# Patient Record
Sex: Female | Born: 1950
Health system: Southern US, Community
[De-identification: ages and names within clinical notes are randomized; demographics above are authoritative.]

## PROBLEM LIST (undated history)

## (undated) DIAGNOSIS — C50919 Malignant neoplasm of unspecified site of unspecified female breast: Secondary | ICD-10-CM

## (undated) DIAGNOSIS — S92351B Displaced fracture of fifth metatarsal bone, right foot, initial encounter for open fracture: Secondary | ICD-10-CM

## (undated) DIAGNOSIS — G4733 Obstructive sleep apnea (adult) (pediatric): Secondary | ICD-10-CM

## (undated) DIAGNOSIS — C4490 Unspecified malignant neoplasm of skin, unspecified: Secondary | ICD-10-CM

## (undated) DIAGNOSIS — I8392 Asymptomatic varicose veins of left lower extremity: Secondary | ICD-10-CM

## (undated) DIAGNOSIS — C50112 Malignant neoplasm of central portion of left female breast: Secondary | ICD-10-CM

## (undated) DIAGNOSIS — J449 Chronic obstructive pulmonary disease, unspecified: Secondary | ICD-10-CM

## (undated) DIAGNOSIS — Z803 Family history of malignant neoplasm of breast: Secondary | ICD-10-CM

## (undated) DIAGNOSIS — F0280 Dementia in other diseases classified elsewhere without behavioral disturbance: Secondary | ICD-10-CM

## (undated) HISTORY — DX: Unspecified malignant neoplasm of skin, unspecified: C44.90

## (undated) HISTORY — DX: Malignant neoplasm of unspecified site of unspecified female breast: C50.919

## (undated) HISTORY — DX: Obstructive sleep apnea (adult) (pediatric): G47.33

## (undated) HISTORY — DX: Dementia in other diseases classified elsewhere, unspecified severity, without behavioral disturbance, psychotic disturbance, mood disturbance, and anxiety: F02.80

## (undated) HISTORY — PX: OTHER SURGICAL HISTORY: SHX169

## (undated) HISTORY — DX: Displaced fracture of fifth metatarsal bone, right foot, initial encounter for open fracture: S92.351B

## (undated) HISTORY — DX: Chronic obstructive pulmonary disease, unspecified: J44.9

## (undated) HISTORY — DX: Asymptomatic varicose veins of left lower extremity: I83.92

## (undated) HISTORY — DX: Family history of malignant neoplasm of breast: Z80.3

## (undated) HISTORY — DX: Malignant neoplasm of central portion of left female breast: C50.112

---

## 1994-02-10 HISTORY — PX: NASAL SEPTUM SURGERY: SHX37

## 1997-02-10 HISTORY — PX: BUNIONECTOMY: SHX129

## 1997-02-10 HISTORY — PX: TENDON TRANSFER: SHX6109

## 1997-02-15 DIAGNOSIS — M21611 Bunion of right foot: Secondary | ICD-10-CM

## 1997-02-15 DIAGNOSIS — J342 Deviated nasal septum: Secondary | ICD-10-CM

## 1997-02-15 HISTORY — DX: Deviated nasal septum: J34.2

## 1997-02-15 HISTORY — DX: Bunion of right foot: M21.611

## 2009-02-10 DIAGNOSIS — C50919 Malignant neoplasm of unspecified site of unspecified female breast: Secondary | ICD-10-CM

## 2009-02-10 HISTORY — DX: Malignant neoplasm of unspecified site of unspecified female breast: C50.919

## 2010-02-10 HISTORY — PX: MASTECTOMY: SHX3

## 2010-09-23 ENCOUNTER — Other Ambulatory Visit (HOSPITAL_COMMUNITY): Payer: Self-pay | Admitting: Obstetrics & Gynecology

## 2010-09-23 DIAGNOSIS — N950 Postmenopausal bleeding: Secondary | ICD-10-CM

## 2010-09-23 DIAGNOSIS — R1032 Left lower quadrant pain: Secondary | ICD-10-CM

## 2010-09-23 DIAGNOSIS — Z853 Personal history of malignant neoplasm of breast: Secondary | ICD-10-CM

## 2010-10-08 ENCOUNTER — Ambulatory Visit (HOSPITAL_COMMUNITY)
Admission: RE | Admit: 2010-10-08 | Discharge: 2010-10-08 | Disposition: A | Discharge status: Home / Self Care | Payer: Managed Care, Other (non HMO) | Source: Ambulatory Visit | Attending: Obstetrics & Gynecology | Admitting: Obstetrics & Gynecology

## 2010-10-08 DIAGNOSIS — D259 Leiomyoma of uterus, unspecified: Secondary | ICD-10-CM | POA: Insufficient documentation

## 2010-10-08 DIAGNOSIS — N950 Postmenopausal bleeding: Secondary | ICD-10-CM | POA: Insufficient documentation

## 2010-10-08 DIAGNOSIS — Z853 Personal history of malignant neoplasm of breast: Secondary | ICD-10-CM | POA: Insufficient documentation

## 2010-10-08 DIAGNOSIS — R1032 Left lower quadrant pain: Secondary | ICD-10-CM

## 2013-10-11 ENCOUNTER — Telehealth: Payer: Self-pay | Admitting: *Deleted

## 2013-10-11 NOTE — Telephone Encounter (Signed)
Received referral for genetics.  Called pt and got hung up on. Will call back later.

## 2013-10-13 ENCOUNTER — Telehealth: Payer: Self-pay | Admitting: *Deleted

## 2013-10-13 NOTE — Telephone Encounter (Signed)
Called and confirmed 10/20/13 genetic appt w/ pt.  Called Denise at referring to make her aware.

## 2013-10-19 ENCOUNTER — Ambulatory Visit (HOSPITAL_BASED_OUTPATIENT_CLINIC_OR_DEPARTMENT_OTHER): Payer: Managed Care, Other (non HMO) | Admitting: Genetic Counselor

## 2013-10-19 ENCOUNTER — Other Ambulatory Visit: Payer: Managed Care, Other (non HMO)

## 2013-10-19 ENCOUNTER — Encounter: Payer: Self-pay | Admitting: Genetic Counselor

## 2013-10-19 DIAGNOSIS — Z803 Family history of malignant neoplasm of breast: Secondary | ICD-10-CM | POA: Insufficient documentation

## 2013-10-19 DIAGNOSIS — C50112 Malignant neoplasm of central portion of left female breast: Secondary | ICD-10-CM | POA: Insufficient documentation

## 2013-10-19 DIAGNOSIS — Z800 Family history of malignant neoplasm of digestive organs: Secondary | ICD-10-CM

## 2013-10-19 DIAGNOSIS — C50919 Malignant neoplasm of unspecified site of unspecified female breast: Secondary | ICD-10-CM

## 2013-10-19 DIAGNOSIS — Z807 Family history of other malignant neoplasms of lymphoid, hematopoietic and related tissues: Secondary | ICD-10-CM

## 2013-10-19 DIAGNOSIS — Z85828 Personal history of other malignant neoplasm of skin: Secondary | ICD-10-CM

## 2013-10-19 DIAGNOSIS — C4490 Unspecified malignant neoplasm of skin, unspecified: Secondary | ICD-10-CM | POA: Insufficient documentation

## 2013-10-19 DIAGNOSIS — Z853 Personal history of malignant neoplasm of breast: Secondary | ICD-10-CM

## 2013-10-19 DIAGNOSIS — IMO0002 Reserved for concepts with insufficient information to code with codable children: Secondary | ICD-10-CM

## 2013-10-19 DIAGNOSIS — Z801 Family history of malignant neoplasm of trachea, bronchus and lung: Secondary | ICD-10-CM

## 2013-10-19 NOTE — Progress Notes (Signed)
Patient Name: Karen Shannon Patient Age: 63 y.o. Encounter Date: 10/19/2013  Referring Physician: Hosie Poisson, MD  Primary Care Provider: Nicoletta Dress, MD   [name], a 63 y.o. female, is being seen at the [facility] due to a personal and family history of breast cancer.  She presents to clinic today to discuss the possibility of a hereditary predisposition to cancer and discuss whether genetic testing is warranted.  HISTORY OF PRESENT ILLNESS: Karen Shannon was diagnosed with breast cancer (IDC/DCIS) at the age of 26. She is s/p bilateral mastectomies. The breast tumor was ER negative, PR negative, and HER2 negative.  She reports a history of basal cell skin cancer on her scalp and underneath her right eye.  She reports a recent biopsy of a nasal mass. Results are not available today.  She reports having had a negative colonoscopy around age 27. Prior to that, she reports having 2-3 colon polyps removed.   Past Medical History  Diagnosis Date  . Malignant neoplasm of breast (female), unspecified site   . Family history of malignant neoplasm of breast   . Skin cancer     basal cell    Past Surgical History  Procedure Laterality Date  . Mastectomy Bilateral 2012    History   Social History  . Marital Status: Single    Spouse Name: N/A    Number of Children: N/A  . Years of Education: N/A   Social History Main Topics  . Smoking status: Not on file  . Smokeless tobacco: Not on file  . Alcohol Use: Not on file  . Drug Use: Not on file  . Sexual Activity: Not on file   Other Topics Concern  . Not on file   Social History Narrative  . No narrative on file     FAMILY HISTORY:   During the visit, a 4-generation pedigree was obtained. Significant diagnoses include the following:  Family History  Problem Relation Age of Onset  . Lymphoma Mother 32    deceased 59  . Breast cancer Maternal Aunt     Dx 32s; Deceased 22s  . Lung  cancer Maternal Uncle   . Stomach cancer Paternal Uncle     deceased 71  . Liver cancer Maternal Uncle   . Prostate cancer Other     3 mat cousins  . Breast cancer Other 69    mother's mat half-sister    Additionally, Karen Shannon has no siblings and no children.  Karen Shannon ancestry is Caucausian - NOS. There is no known Jewish ancestry and no consanguinity.  ASSESSMENT AND PLAN: Karen Shannon is a 63 y.o. female with a personal history of triple negative breast cancer and distant family history of breast cancer at older ages in a maternal aunt and maternal half-aunt. This history is not highly suggestive of a hereditary predisposition to cancer, but genetic testing is warranted due to her triple negative breast cancer. We reviewed the characteristics, features and inheritance patterns of hereditary cancer syndromes. We also discussed genetic testing, including the appropriate family members to test, the process of testing, insurance coverage and implications of results.   Karen Shannon wished to pursue genetic testing and a blood sample will be sent to [facility] for analysis of the 17 genes on the BreastNext gene panel. We discussed the implications of a positive, negative and/ or Variant of Uncertain Significance (VUS) result. Results should be available in approximately 4-5 weeks, at which point we will contact her and  address implications for her as well as address genetic testing for at-risk family members, if needed.    We encouraged Karen Shannon to remain in contact with Cancer Genetics annually so that we can update the family history and inform her of any changes in cancer genetics and testing that may be of benefit for this family. Ms.  Shannon questions were answered to her satisfaction today.   Thank you for the referral and allowing [place] to share in the care of your patient.   The patient was seen for a total of 40 minutes, greater than 50% of which was spent face-to-face  counseling. This patient was discussed with the overseeing provider who agrees with the above.   Steele Berg, MS, [place] Certified Genetic Counseor phone: [phone] [email or web address]

## 2013-10-20 ENCOUNTER — Other Ambulatory Visit: Payer: Managed Care, Other (non HMO)

## 2013-11-21 ENCOUNTER — Encounter: Payer: Self-pay | Admitting: Genetic Counselor

## 2013-11-21 NOTE — Progress Notes (Signed)
GENETIC TEST RESULTS  Patient Name: Domnique Crumrine Patient Age: 63 y.o. Encounter Date: 11/21/2013  Referring Physician: Robert Wein, MD   Ms. Lutes was called today to discuss genetic test results. Please see the Genetics note from her visit on 10/19/13 for a detailed discussion of her personal and family history.  GENETIC TESTING: At the time of Ms. Yarde's visit, we recommended she pursue genetic testing of multiple genes on the BreastNext gene panel. This test, which included sequencing and deletion/duplication analysis of 17 genes, was performed at [facility]. Testing did not reveal any clearly pathogenic mutation in any of these genes. The genes tested were ATM, BARD1, BRCA1, BRCA2, BRIP1, CDH1, CHEK2, MRE11A, MUTYH, NBN, NF1, PALB2, PTEN, RAD50, RAD51C, RAD51D, and TP53.  We discussed with Ms. Lanting that since the current test is not perfect, it is possible there may be a gene mutation that current testing cannot detect, but that chance is small. We also discussed that it is possible that a different genetic factor, which was not part of this testing or has not yet been discovered, is responsible for the cancer diagnoses in the family. Given her age at diagnosis, this is also of low likelihood.     Genetic testing did detect a Variant of Unknown Significance in the PALB2 gene called p.L100F (c.298C>T). At this time, it is unknown if this variant is associated with increased cancer risk or if this is a normal finding, but most variants such as this get reclassified to being inconsequential. It should not be used to make medical management decisions. With time, we suspect the lab will determine the significance of this variant, if any. If we do learn more about it, we will try to contact Ms. Vanderstelt to discuss it further. However, it is important to stay in touch with us periodically and keep the address and phone number up to date.  CANCER SCREENING: This result is  reassuring and indicates that Ms. Lafitte does not likely have an increased risk of cancer due to a mutation in one of these genes. We recommended Ms. Mancusi continue to follow the cancer screening guidelines provided by her primary physician.   FAMILY MEMBERS: Women in the family are at some increased risk of developing breast cancer, over the general population risk, simply due to the family history. We recommended they have a yearly mammogram, a yearly clinical breast exam, and perform monthly breast self-exams. A gynecologic exam is recommended yearly. Colon cancer screening is recommended to begin by age 50.  Relatives should not be tested for the PALB2 VUS outside of a research setting as it would have no implications for their medical management.  Lastly, we discussed with Ms. Leisure that cancer genetics is a rapidly advancing field and it is possible that new genetic tests will be appropriate for her in the future. We encouraged her to remain in contact with us on an annual basis so we can update her personal and family histories, and let her know of advances in cancer genetics that may benefit the family. Our contact number was provided. Ms. Stollings's questions were answered to her satisfaction today, and she knows she is welcome to call anytime with additional questions.     , MS, CGC Certified Genetic Counseor phone: [phone] [email or web address] 

## 2015-05-24 DIAGNOSIS — C50912 Malignant neoplasm of unspecified site of left female breast: Secondary | ICD-10-CM | POA: Diagnosis not present

## 2015-05-24 DIAGNOSIS — Z171 Estrogen receptor negative status [ER-]: Secondary | ICD-10-CM | POA: Diagnosis not present

## 2016-02-11 HISTORY — PX: SKIN CANCER EXCISION: SHX779

## 2016-09-11 DIAGNOSIS — E0590 Thyrotoxicosis, unspecified without thyrotoxic crisis or storm: Secondary | ICD-10-CM | POA: Diagnosis not present

## 2016-09-11 DIAGNOSIS — Z23 Encounter for immunization: Secondary | ICD-10-CM | POA: Diagnosis not present

## 2016-09-11 DIAGNOSIS — Z6832 Body mass index (BMI) 32.0-32.9, adult: Secondary | ICD-10-CM | POA: Diagnosis not present

## 2016-09-11 DIAGNOSIS — E559 Vitamin D deficiency, unspecified: Secondary | ICD-10-CM | POA: Diagnosis not present

## 2016-09-11 DIAGNOSIS — M25572 Pain in left ankle and joints of left foot: Secondary | ICD-10-CM | POA: Diagnosis not present

## 2016-09-11 DIAGNOSIS — J4540 Moderate persistent asthma, uncomplicated: Secondary | ICD-10-CM | POA: Diagnosis not present

## 2016-09-11 DIAGNOSIS — C50919 Malignant neoplasm of unspecified site of unspecified female breast: Secondary | ICD-10-CM | POA: Diagnosis not present

## 2016-09-11 DIAGNOSIS — E785 Hyperlipidemia, unspecified: Secondary | ICD-10-CM | POA: Diagnosis not present

## 2016-09-11 DIAGNOSIS — I872 Venous insufficiency (chronic) (peripheral): Secondary | ICD-10-CM | POA: Diagnosis not present

## 2016-09-17 DIAGNOSIS — M659 Synovitis and tenosynovitis, unspecified: Secondary | ICD-10-CM | POA: Diagnosis not present

## 2016-10-01 DIAGNOSIS — M2142 Flat foot [pes planus] (acquired), left foot: Secondary | ICD-10-CM | POA: Diagnosis not present

## 2016-10-01 DIAGNOSIS — M2141 Flat foot [pes planus] (acquired), right foot: Secondary | ICD-10-CM | POA: Diagnosis not present

## 2016-11-03 DIAGNOSIS — M25562 Pain in left knee: Secondary | ICD-10-CM | POA: Diagnosis not present

## 2016-11-03 DIAGNOSIS — R0781 Pleurodynia: Secondary | ICD-10-CM | POA: Diagnosis not present

## 2016-11-03 DIAGNOSIS — M79671 Pain in right foot: Secondary | ICD-10-CM | POA: Diagnosis not present

## 2016-11-03 DIAGNOSIS — S8992XA Unspecified injury of left lower leg, initial encounter: Secondary | ICD-10-CM | POA: Diagnosis not present

## 2016-11-03 DIAGNOSIS — S2231XA Fracture of one rib, right side, initial encounter for closed fracture: Secondary | ICD-10-CM | POA: Diagnosis not present

## 2016-11-03 DIAGNOSIS — S9031XA Contusion of right foot, initial encounter: Secondary | ICD-10-CM | POA: Diagnosis not present

## 2016-11-03 DIAGNOSIS — S064X0A Epidural hemorrhage without loss of consciousness, initial encounter: Secondary | ICD-10-CM | POA: Diagnosis not present

## 2016-11-03 DIAGNOSIS — S0990XA Unspecified injury of head, initial encounter: Secondary | ICD-10-CM | POA: Diagnosis not present

## 2016-11-03 DIAGNOSIS — R079 Chest pain, unspecified: Secondary | ICD-10-CM | POA: Diagnosis not present

## 2016-11-03 DIAGNOSIS — S99921A Unspecified injury of right foot, initial encounter: Secondary | ICD-10-CM | POA: Diagnosis not present

## 2016-11-03 DIAGNOSIS — S8002XA Contusion of left knee, initial encounter: Secondary | ICD-10-CM | POA: Diagnosis not present

## 2016-11-29 DIAGNOSIS — Z23 Encounter for immunization: Secondary | ICD-10-CM | POA: Diagnosis not present

## 2016-12-13 DIAGNOSIS — E785 Hyperlipidemia, unspecified: Secondary | ICD-10-CM | POA: Diagnosis not present

## 2016-12-13 DIAGNOSIS — E559 Vitamin D deficiency, unspecified: Secondary | ICD-10-CM | POA: Diagnosis not present

## 2016-12-13 DIAGNOSIS — E0590 Thyrotoxicosis, unspecified without thyrotoxic crisis or storm: Secondary | ICD-10-CM | POA: Diagnosis not present

## 2016-12-13 DIAGNOSIS — J4540 Moderate persistent asthma, uncomplicated: Secondary | ICD-10-CM | POA: Diagnosis not present

## 2016-12-13 DIAGNOSIS — M79605 Pain in left leg: Secondary | ICD-10-CM | POA: Diagnosis not present

## 2016-12-13 DIAGNOSIS — K629 Disease of anus and rectum, unspecified: Secondary | ICD-10-CM | POA: Diagnosis not present

## 2016-12-13 DIAGNOSIS — I872 Venous insufficiency (chronic) (peripheral): Secondary | ICD-10-CM | POA: Diagnosis not present

## 2016-12-13 DIAGNOSIS — C50919 Malignant neoplasm of unspecified site of unspecified female breast: Secondary | ICD-10-CM | POA: Diagnosis not present

## 2016-12-16 DIAGNOSIS — R1032 Left lower quadrant pain: Secondary | ICD-10-CM | POA: Diagnosis not present

## 2016-12-16 DIAGNOSIS — Z1211 Encounter for screening for malignant neoplasm of colon: Secondary | ICD-10-CM | POA: Diagnosis not present

## 2016-12-16 DIAGNOSIS — K629 Disease of anus and rectum, unspecified: Secondary | ICD-10-CM | POA: Diagnosis not present

## 2016-12-16 DIAGNOSIS — Z86010 Personal history of colonic polyps: Secondary | ICD-10-CM | POA: Diagnosis not present

## 2016-12-31 DIAGNOSIS — Z86010 Personal history of colonic polyps: Secondary | ICD-10-CM | POA: Diagnosis not present

## 2016-12-31 DIAGNOSIS — K635 Polyp of colon: Secondary | ICD-10-CM | POA: Diagnosis not present

## 2016-12-31 DIAGNOSIS — K5730 Diverticulosis of large intestine without perforation or abscess without bleeding: Secondary | ICD-10-CM | POA: Diagnosis not present

## 2016-12-31 DIAGNOSIS — D125 Benign neoplasm of sigmoid colon: Secondary | ICD-10-CM | POA: Diagnosis not present

## 2016-12-31 DIAGNOSIS — Z1211 Encounter for screening for malignant neoplasm of colon: Secondary | ICD-10-CM | POA: Diagnosis not present

## 2017-01-05 DIAGNOSIS — Z853 Personal history of malignant neoplasm of breast: Secondary | ICD-10-CM | POA: Diagnosis not present

## 2017-01-14 DIAGNOSIS — M79674 Pain in right toe(s): Secondary | ICD-10-CM | POA: Diagnosis not present

## 2017-01-15 DIAGNOSIS — M79671 Pain in right foot: Secondary | ICD-10-CM | POA: Diagnosis not present

## 2017-01-15 DIAGNOSIS — M79674 Pain in right toe(s): Secondary | ICD-10-CM | POA: Diagnosis not present

## 2017-02-05 DIAGNOSIS — J208 Acute bronchitis due to other specified organisms: Secondary | ICD-10-CM | POA: Diagnosis not present

## 2017-02-05 DIAGNOSIS — Z6831 Body mass index (BMI) 31.0-31.9, adult: Secondary | ICD-10-CM | POA: Diagnosis not present

## 2017-03-14 DIAGNOSIS — Z6833 Body mass index (BMI) 33.0-33.9, adult: Secondary | ICD-10-CM | POA: Diagnosis not present

## 2017-03-14 DIAGNOSIS — E559 Vitamin D deficiency, unspecified: Secondary | ICD-10-CM | POA: Diagnosis not present

## 2017-03-14 DIAGNOSIS — J208 Acute bronchitis due to other specified organisms: Secondary | ICD-10-CM | POA: Diagnosis not present

## 2017-03-14 DIAGNOSIS — C50919 Malignant neoplasm of unspecified site of unspecified female breast: Secondary | ICD-10-CM | POA: Diagnosis not present

## 2017-03-14 DIAGNOSIS — J4540 Moderate persistent asthma, uncomplicated: Secondary | ICD-10-CM | POA: Diagnosis not present

## 2017-03-14 DIAGNOSIS — E0590 Thyrotoxicosis, unspecified without thyrotoxic crisis or storm: Secondary | ICD-10-CM | POA: Diagnosis not present

## 2017-03-14 DIAGNOSIS — E785 Hyperlipidemia, unspecified: Secondary | ICD-10-CM | POA: Diagnosis not present

## 2017-03-14 DIAGNOSIS — I872 Venous insufficiency (chronic) (peripheral): Secondary | ICD-10-CM | POA: Diagnosis not present

## 2017-03-14 DIAGNOSIS — Z9181 History of falling: Secondary | ICD-10-CM | POA: Diagnosis not present

## 2017-05-11 DIAGNOSIS — M549 Dorsalgia, unspecified: Secondary | ICD-10-CM | POA: Diagnosis not present

## 2017-05-11 DIAGNOSIS — Z6832 Body mass index (BMI) 32.0-32.9, adult: Secondary | ICD-10-CM | POA: Diagnosis not present

## 2017-05-11 DIAGNOSIS — K5792 Diverticulitis of intestine, part unspecified, without perforation or abscess without bleeding: Secondary | ICD-10-CM | POA: Diagnosis not present

## 2017-05-11 DIAGNOSIS — M25561 Pain in right knee: Secondary | ICD-10-CM | POA: Diagnosis not present

## 2017-05-21 DIAGNOSIS — N3020 Other chronic cystitis without hematuria: Secondary | ICD-10-CM | POA: Diagnosis not present

## 2017-05-21 DIAGNOSIS — M545 Low back pain: Secondary | ICD-10-CM | POA: Diagnosis not present

## 2017-05-26 DIAGNOSIS — M7062 Trochanteric bursitis, left hip: Secondary | ICD-10-CM | POA: Diagnosis not present

## 2017-05-26 DIAGNOSIS — M7061 Trochanteric bursitis, right hip: Secondary | ICD-10-CM | POA: Diagnosis not present

## 2017-06-09 ENCOUNTER — Other Ambulatory Visit: Payer: Self-pay

## 2017-06-09 DIAGNOSIS — I878 Other specified disorders of veins: Secondary | ICD-10-CM

## 2017-06-10 DIAGNOSIS — E559 Vitamin D deficiency, unspecified: Secondary | ICD-10-CM | POA: Diagnosis not present

## 2017-06-10 DIAGNOSIS — J45909 Unspecified asthma, uncomplicated: Secondary | ICD-10-CM | POA: Diagnosis not present

## 2017-06-10 DIAGNOSIS — E785 Hyperlipidemia, unspecified: Secondary | ICD-10-CM | POA: Diagnosis not present

## 2017-06-10 DIAGNOSIS — C50919 Malignant neoplasm of unspecified site of unspecified female breast: Secondary | ICD-10-CM | POA: Diagnosis not present

## 2017-06-10 DIAGNOSIS — E0590 Thyrotoxicosis, unspecified without thyrotoxic crisis or storm: Secondary | ICD-10-CM | POA: Diagnosis not present

## 2017-06-10 DIAGNOSIS — J208 Acute bronchitis due to other specified organisms: Secondary | ICD-10-CM | POA: Diagnosis not present

## 2017-06-10 DIAGNOSIS — I872 Venous insufficiency (chronic) (peripheral): Secondary | ICD-10-CM | POA: Diagnosis not present

## 2017-06-11 DIAGNOSIS — M4726 Other spondylosis with radiculopathy, lumbar region: Secondary | ICD-10-CM | POA: Diagnosis not present

## 2017-06-11 DIAGNOSIS — M5431 Sciatica, right side: Secondary | ICD-10-CM | POA: Diagnosis not present

## 2017-06-23 DIAGNOSIS — L578 Other skin changes due to chronic exposure to nonionizing radiation: Secondary | ICD-10-CM | POA: Diagnosis not present

## 2017-06-23 DIAGNOSIS — L728 Other follicular cysts of the skin and subcutaneous tissue: Secondary | ICD-10-CM | POA: Diagnosis not present

## 2017-06-23 DIAGNOSIS — L820 Inflamed seborrheic keratosis: Secondary | ICD-10-CM | POA: Diagnosis not present

## 2017-06-23 DIAGNOSIS — L821 Other seborrheic keratosis: Secondary | ICD-10-CM | POA: Diagnosis not present

## 2017-06-29 DIAGNOSIS — Z853 Personal history of malignant neoplasm of breast: Secondary | ICD-10-CM | POA: Diagnosis not present

## 2017-07-20 DIAGNOSIS — R100 Acute abdomen: Secondary | ICD-10-CM | POA: Diagnosis not present

## 2017-07-20 DIAGNOSIS — R311 Benign essential microscopic hematuria: Secondary | ICD-10-CM | POA: Diagnosis not present

## 2017-07-20 DIAGNOSIS — N3090 Cystitis, unspecified without hematuria: Secondary | ICD-10-CM | POA: Diagnosis not present

## 2017-07-20 DIAGNOSIS — N3021 Other chronic cystitis with hematuria: Secondary | ICD-10-CM | POA: Diagnosis not present

## 2017-07-20 DIAGNOSIS — N318 Other neuromuscular dysfunction of bladder: Secondary | ICD-10-CM | POA: Diagnosis not present

## 2017-07-21 DIAGNOSIS — E669 Obesity, unspecified: Secondary | ICD-10-CM | POA: Diagnosis not present

## 2017-07-21 DIAGNOSIS — Z0000 Encounter for general adult medical examination without abnormal findings: Secondary | ICD-10-CM | POA: Diagnosis not present

## 2017-07-21 DIAGNOSIS — E785 Hyperlipidemia, unspecified: Secondary | ICD-10-CM | POA: Diagnosis not present

## 2017-07-21 DIAGNOSIS — Z136 Encounter for screening for cardiovascular disorders: Secondary | ICD-10-CM | POA: Diagnosis not present

## 2017-07-21 DIAGNOSIS — Z9181 History of falling: Secondary | ICD-10-CM | POA: Diagnosis not present

## 2017-07-21 DIAGNOSIS — Z1331 Encounter for screening for depression: Secondary | ICD-10-CM | POA: Diagnosis not present

## 2017-07-21 DIAGNOSIS — N959 Unspecified menopausal and perimenopausal disorder: Secondary | ICD-10-CM | POA: Diagnosis not present

## 2017-07-21 DIAGNOSIS — Z6834 Body mass index (BMI) 34.0-34.9, adult: Secondary | ICD-10-CM | POA: Diagnosis not present

## 2017-07-30 DIAGNOSIS — R310 Gross hematuria: Secondary | ICD-10-CM | POA: Diagnosis not present

## 2017-07-30 DIAGNOSIS — N3021 Other chronic cystitis with hematuria: Secondary | ICD-10-CM | POA: Diagnosis not present

## 2017-07-30 DIAGNOSIS — N3090 Cystitis, unspecified without hematuria: Secondary | ICD-10-CM | POA: Diagnosis not present

## 2017-07-31 ENCOUNTER — Ambulatory Visit (HOSPITAL_COMMUNITY)
Admission: RE | Admit: 2017-07-31 | Discharge: 2017-07-31 | Disposition: A | Discharge status: Home / Self Care | Payer: Medicare Other | Source: Ambulatory Visit | Attending: Vascular Surgery | Admitting: Vascular Surgery

## 2017-07-31 DIAGNOSIS — I878 Other specified disorders of veins: Secondary | ICD-10-CM | POA: Diagnosis not present

## 2017-07-31 DIAGNOSIS — I872 Venous insufficiency (chronic) (peripheral): Secondary | ICD-10-CM | POA: Insufficient documentation

## 2017-08-04 ENCOUNTER — Encounter: Payer: Self-pay | Admitting: Vascular Surgery

## 2017-08-04 ENCOUNTER — Ambulatory Visit (INDEPENDENT_AMBULATORY_CARE_PROVIDER_SITE_OTHER): Payer: Medicare Other | Admitting: Vascular Surgery

## 2017-08-04 ENCOUNTER — Encounter

## 2017-08-04 VITALS — BP 96/69 | HR 85 | Temp 97.5°F | Ht 64.0 in | Wt 197.0 lb

## 2017-08-04 DIAGNOSIS — I878 Other specified disorders of veins: Secondary | ICD-10-CM

## 2017-08-04 DIAGNOSIS — I83893 Varicose veins of bilateral lower extremities with other complications: Secondary | ICD-10-CM

## 2017-08-04 NOTE — Progress Notes (Signed)
[facility] Specialist of [place]  Patient name: Karen Shannon MRN: [id number] DOB: 12-21-1950 Sex: female  REASON FOR CONSULT: Evaluation lower extremity venous varicosities  HPI: Karen Shannon is a 67 y.o. female, who is his varicosities.  Swelling in both legs to some degree but much more on her left leg.  She also has an area of thickness and erythema on the medial aspect of her left leg above her medial malleolus.  Reports strong family history of this room because her grandmother having what sounds like a severe class VI venous stasis disease.  Does have significant orthopedic issues and reports that she has some issues regarding her joints and tendons in both lower extremities.  She has no history of DVT  Past Medical History:  Diagnosis Date  . Family history of malignant neoplasm of breast   . Malignant neoplasm of breast (female), unspecified site   . Skin cancer    basal cell    Family History  Problem Relation Age of Onset  . Lymphoma Mother 16       deceased 4  . Breast cancer Maternal Aunt        Dx 25s; Deceased 63s  . Lung cancer Maternal Uncle   . Stomach cancer Paternal Uncle        deceased 15  . Liver cancer Maternal Uncle   . Prostate cancer Other        3 mat cousins  . Breast cancer Other 11       mother's mat half-sister    SOCIAL HISTORY: Social History   Socioeconomic History  . Marital status: Single    Spouse name: Not on file  . Number of children: Not on file  . Years of education: Not on file  . Highest education level: Not on file  Occupational History  . Not on file  Social Needs  . Financial resource strain: Not on file  . Food insecurity:    Worry: Not on file    Inability: Not on file  . Transportation needs:    Medical: Not on file    Non-medical: Not on file  Tobacco Use  . Smoking status: Never Smoker  . Smokeless tobacco: Never Used  Substance and Sexual Activity  .  Alcohol use: Not on file  . Drug use: Not on file  . Sexual activity: Not on file  Lifestyle  . Physical activity:    Days per week: Not on file    Minutes per session: Not on file  . Stress: Not on file  Relationships  . Social connections:    Talks on phone: Not on file    Gets together: Not on file    Attends religious service: Not on file    Active member of club or organization: Not on file    Attends meetings of clubs or organizations: Not on file    Relationship status: Not on file  . Intimate partner violence:    Fear of current or ex partner: Not on file    Emotionally abused: Not on file    Physically abused: Not on file    Forced sexual activity: Not on file  Other Topics Concern  . Not on file  Social History Narrative  . Not on file    No Known Allergies  Current Outpatient Medications  Medication Sig Dispense Refill  . atorvastatin (LIPITOR) 40 MG tablet TAKE 1 TABLET BY MOUTH EVERYDAY AT BEDTIME    . diphenoxylate-atropine (LOMOTIL)  2.5-0.025 MG tablet diphenoxylate-atropine 2.5 mg-0.025 mg tablet    . montelukast (SINGULAIR) 10 MG tablet TAKE 1 TABLET BY MOUTH EVERYDAY AT BEDTIME  12  . tamsulosin (FLOMAX) 0.4 MG CAPS capsule TAKE 2 (TWO) CAPSULE AT BEDTIME  1   No current facility-administered medications for this visit.     REVIEW OF SYSTEMS:  [X]  denotes positive finding, [ ]  denotes negative finding Cardiac  Comments:  Chest pain or chest pressure:    Shortness of breath upon exertion: x   Short of breath when lying flat:    Irregular heart rhythm: x       Vascular    Pain in calf, thigh, or hip brought on by ambulation: x   Pain in feet at night that wakes you up from your sleep:  x   Blood clot in your veins:    Leg swelling:  x       Pulmonary    Oxygen at home:    Productive cough:     Wheezing:  x       Neurologic    Sudden weakness in arms or legs:     Sudden numbness in arms or legs:     Sudden onset of difficulty speaking or  slurred speech: x   Temporary loss of vision in one eye:     Problems with dizziness:         Gastrointestinal    Blood in stool:     Vomited blood:         Genitourinary    Burning when urinating:     Blood in urine:        Psychiatric    Major depression:         Hematologic    Bleeding problems:    Problems with blood clotting too easily:        Skin    Rashes or ulcers:        Constitutional    Fever or chills:      PHYSICAL EXAM: Vitals:   08/04/17 1318  BP: 96/69  Pulse: 85  Temp: (!) 97.5 F (36.4 C)  TempSrc: Oral  SpO2: 95%  Weight: 197 lb (89.4 kg)  Height: 5\' 4"  (1.626 m)    GENERAL: The patient is a well-nourished female, in no acute distress. The vital signs are documented above. CARDIOVASCULAR: 2+ radial and 2+ dorsalis pedis pulses bilaterally.  Significant varicosities over her left medial thigh.  She does have an area of thickening and erythema over the left ankle just above her medial malleolus PULMONARY: There is good air exchange  ABDOMEN: Soft and non-tender  MUSCULOSKELETAL: There are no major deformities or cyanosis. NEUROLOGIC: No focal weakness or paresthesias are detected. SKIN: There are no ulcers or rashes noted. PSYCHIATRIC: The patient has a normal affect.  DATA:  Duplex revealed reflux in her great saphenous vein on the left extending into these tributary varicosities  MEDICAL ISSUES: CEAP 4 stasis disease left leg.  She has been compliant with panty style graduated compression garments.  She was unable to keep thigh-high compression garments from rolling down.  Despite this she has had progressive difficulty.  I do feel that she would be a excellent candidate for laser ablation of her great saphenous vein on the left.  She may require subsequent stab phlebectomy depending on the results of the ablation.  I described the procedure in detail with the patient.  Schedule this once we have assured insurance coverage   Sherren Mocha  Katina Dung, MD  FACS [facility] Tel [phone] Pager [phone]

## 2017-08-05 DIAGNOSIS — M85851 Other specified disorders of bone density and structure, right thigh: Secondary | ICD-10-CM | POA: Diagnosis not present

## 2017-08-05 DIAGNOSIS — N959 Unspecified menopausal and perimenopausal disorder: Secondary | ICD-10-CM | POA: Diagnosis not present

## 2017-08-18 ENCOUNTER — Encounter: Payer: Medicare Other | Admitting: Vascular Surgery

## 2017-08-18 ENCOUNTER — Encounter (HOSPITAL_COMMUNITY): Payer: Medicare Other

## 2017-08-20 ENCOUNTER — Telehealth: Payer: Self-pay | Admitting: Vascular Surgery

## 2017-08-20 NOTE — Telephone Encounter (Signed)
resch appt lvm 08/24/17 915am f/u MD

## 2017-08-24 ENCOUNTER — Ambulatory Visit (INDEPENDENT_AMBULATORY_CARE_PROVIDER_SITE_OTHER): Payer: Medicare Other | Admitting: Vascular Surgery

## 2017-08-24 ENCOUNTER — Encounter: Payer: Self-pay | Admitting: Vascular Surgery

## 2017-08-24 VITALS — BP 124/77 | HR 81 | Temp 97.4°F | Resp 16 | Ht 66.0 in | Wt 198.0 lb

## 2017-08-24 DIAGNOSIS — I83813 Varicose veins of bilateral lower extremities with pain: Secondary | ICD-10-CM

## 2017-08-24 NOTE — Progress Notes (Signed)
Patient name: Karen Shannon MRN: [id number] DOB: [birth year] Sex: female  REASON FOR VISIT:   Follow-up of chronic venous insufficiency  HPI:   Karen Shannon is a pleasant 67 y.o. female who was seen in consultation by Dr. Sherren Mocha Early on 08/04/2017 with chronic venous insufficiency.  Patient had swelling in both legs, but more significantly on the left side..  She had no history of DVT.  The patient had a duplex scan which showed reflux in the left great saphenous vein extending to the varicose tributaries.  The patient had CEAP clinical class IV disease.  She had been compliant with her thigh-high compression stockings.  And she was felt to be a good candidate for laser ablation of her left great saphenous vein.  It was felt that she may require subsequent stab phlebectomies depending on the results of her ambulation.  She comes in today for a follow-up visit.  She continues to wear her thigh-high compression stockings.  She uses ibuprofen as needed for pain.  She also elevates her legs some.  She complains of swelling and some aching heavy feeling in her legs which is aggravated by standing and relieved with elevation.  Current Outpatient Medications  Medication Sig Dispense Refill  . atorvastatin (LIPITOR) 40 MG tablet Take 2 in am and TAKE 1 TABLET BY MOUTH EVERYDAY AT BEDTIME    . beclomethasone (QVAR) 80 MCG/ACT inhaler Inhale 1 puff into the lungs 2 (two) times daily.    . Cholecalciferol (VITAMIN D3) 2000 units TABS Take 2,000 Int'l Units by mouth 1 day or 1 dose.    . diphenoxylate-atropine (LOMOTIL) 2.5-0.025 MG tablet diphenoxylate-atropine 2.5 mg-0.025 mg tablet    . ergocalciferol (VITAMIN D2) 50000 units capsule Take 50,000 Units by mouth once a week.    . montelukast (SINGULAIR) 10 MG tablet TAKE 2 TABLET BY MOUTH EVERYDAY AT BEDTIME  12  . tamsulosin (FLOMAX) 0.4 MG CAPS capsule TAKE 2 (TWO) CAPSULE AT BEDTIME  1  . Tiotropium Bromide-Olodaterol (STIOLTO RESPIMAT) 2.5-2.5  MCG/ACT AERS Inhale 2 puffs into the lungs 1 day or 1 dose.     No current facility-administered medications for this visit.     REVIEW OF SYSTEMS:  [X]  denotes positive finding, [ ]  denotes negative finding Cardiac  Comments:  Chest pain or chest pressure:    Shortness of breath upon exertion:    Short of breath when lying flat:    Irregular heart rhythm:    Constitutional    Fever or chills:     PHYSICAL EXAM:   Vitals:   08/24/17 0915  BP: 124/77  Pulse: 81  Resp: 16  Temp: (!) 97.4 F (36.3 C)  SpO2: 98%  Weight: 198 lb (89.8 kg)  Height: 5\' 6"  (1.676 m)    GENERAL: The patient is a well-nourished female, in no acute distress. The vital signs are documented above. CARDIOVASCULAR: There is a regular rate and rhythm. PULMONARY: There is good air exchange bilaterally without wheezing or rales. VASCULAR: ARTERIAL: She has palpable pedal pulses. VENOUS: She has some reticular veins and spider veins along her lateral thighs and calves bilaterally.  She has some larger varicose veins along the lateral aspect of her left calf.  She does have some hyperpigmentation bilaterally. I did interrogate with the SonoSite and she does have an incompetent great saphenous vein from the knee up towards the proximal thigh.  It appears that the best access site would be at about the level of the knee.  Below that  the vein becomes superficial.  DATA:   VENOUS DUPLEX: I reviewed the venous duplex scan that was done on 07/31/2017.  On the left side, there was no evidence of deep venous thrombosis or superficial venous thrombosis.  There was no deep venous reflux.  There is no reflux in the great saphenous vein in the proximal thigh but there was reflux in the great saphenous vein in the mid thigh, distal thigh, and at the knee.  On the right side there is no evidence of deep venous thrombosis or superficial thrombophlebitis.  There was no evidence of great saphenous vein reflux or short saphenous  vein reflux.  There is no deep venous reflux on the right.  MEDICAL ISSUES:   CHRONIC VENOUS INSUFFICIENCY: This patient has CEAP clinical class IVa disease with hyperpigmentation.  She has incompetent left great saphenous vein and I think she is a good candidate for endovenous laser ablation of the left great saphenous vein.  I have discussed the indications for endovenous laser ablation of the left GSV, that is to lower the pressure in the veins and potentially help relieve the symptoms from venous hypertension. I have also discussed alternative options including conservative treatment with leg elevation, compression therapy, and other lifestyle changes. I have discussed the potential complications of the procedure, including, but not limited to: bleeding, bruising, leg swelling, nerve injury, skin burns, significant pain from phlebitis, deep venous thrombosis, or failure of the vein to close. All of the patient's questions were encouraged and answered. They are agreeable to proceed.   We will evaluate her varicose veins 3 months postop and also her spider veins to determine if she might require subsequent stab phlebectomies or sclerotherapy.  Deitra Mayo [facility] [phone]

## 2017-09-07 ENCOUNTER — Other Ambulatory Visit: Payer: Self-pay

## 2017-09-17 DIAGNOSIS — M7062 Trochanteric bursitis, left hip: Secondary | ICD-10-CM | POA: Diagnosis not present

## 2017-09-17 DIAGNOSIS — M7061 Trochanteric bursitis, right hip: Secondary | ICD-10-CM | POA: Diagnosis not present

## 2017-09-26 DIAGNOSIS — E559 Vitamin D deficiency, unspecified: Secondary | ICD-10-CM | POA: Diagnosis not present

## 2017-09-26 DIAGNOSIS — E0590 Thyrotoxicosis, unspecified without thyrotoxic crisis or storm: Secondary | ICD-10-CM | POA: Diagnosis not present

## 2017-09-26 DIAGNOSIS — C50919 Malignant neoplasm of unspecified site of unspecified female breast: Secondary | ICD-10-CM | POA: Diagnosis not present

## 2017-09-26 DIAGNOSIS — E785 Hyperlipidemia, unspecified: Secondary | ICD-10-CM | POA: Diagnosis not present

## 2017-09-26 DIAGNOSIS — I872 Venous insufficiency (chronic) (peripheral): Secondary | ICD-10-CM | POA: Diagnosis not present

## 2017-09-26 DIAGNOSIS — Z23 Encounter for immunization: Secondary | ICD-10-CM | POA: Diagnosis not present

## 2017-10-01 ENCOUNTER — Other Ambulatory Visit: Payer: Self-pay

## 2017-10-01 ENCOUNTER — Ambulatory Visit (INDEPENDENT_AMBULATORY_CARE_PROVIDER_SITE_OTHER): Payer: Medicare Other | Admitting: Vascular Surgery

## 2017-10-01 ENCOUNTER — Encounter: Payer: Self-pay | Admitting: Vascular Surgery

## 2017-10-01 VITALS — BP 138/81 | HR 76 | Temp 97.6°F | Resp 16 | Ht 66.0 in | Wt 196.0 lb

## 2017-10-01 DIAGNOSIS — I872 Venous insufficiency (chronic) (peripheral): Secondary | ICD-10-CM

## 2017-10-01 DIAGNOSIS — I83893 Varicose veins of bilateral lower extremities with other complications: Secondary | ICD-10-CM

## 2017-10-01 NOTE — Progress Notes (Signed)
     Laser Ablation Procedure    Date: 10/01/2017   Karen Shannon DOB:1950/06/08  Consent signed: Yes    Surgeon:  Dr. Sherren Mocha Early  Procedure: Laser Ablation: left Greater Saphenous Vein  BP 138/81 (BP Location: Left Arm, Patient Position: Sitting, Cuff Size: Normal)   Pulse 76   Temp 97.6 F (36.4 C) (Oral)   Resp 16   Ht 5\' 6"  (1.676 m)   Wt 196 lb (88.9 kg)   SpO2 100%   BMI 31.64 kg/m   Tumescent Anesthesia: 500 cc 0.9% NaCl with 50 cc Lidocaine HCL with 1% Epi and 15 cc 8.4% NaHCO3  Local Anesthesia: 3 cc Lidocaine HCL and NaHCO3 (ratio 2:1)  15 watts continuous mode        Total energy: 2140   Total time: 2:23    Patient tolerated procedure well  Notes:   Description of Procedure:  After marking the course of the secondary varicosities, the patient was placed on the operating table in the supine position, and the left leg was prepped and draped in sterile fashion.   Local anesthetic was administered and under ultrasound guidance the saphenous vein was accessed with a micro needle and guide wire; then the mirco puncture sheath was placed.  A guide wire was inserted saphenofemoral junction , followed by a 5 french sheath.  The position of the sheath and then the laser fiber below the junction was confirmed using the ultrasound.  Tumescent anesthesia was administered along the course of the saphenous vein using ultrasound guidance. The patient was placed in Trendelenburg position and protective laser glasses were placed on patient and staff, and the laser was fired at 15 watts continuous mode advancing 1-44mm/second for a total of 2140 joules.     Steri strips were applied to the stab wounds and ABD pads and thigh high compression stockings were applied.  Ace wrap bandages were applied over the phlebectomy sites and at the top of the saphenofemoral junction. Blood loss was less than 15 cc.  The patient ambulated out of the operating room having tolerated the procedure  well.

## 2017-10-01 NOTE — Progress Notes (Signed)
Patient name: Karen Shannon MRN: [id number] DOB: Apr 15, 1950 Sex: female  REASON FOR VISIT:   For endovenous laser ablation of the left great saphenous vein  HPI:   Karen Shannon is a pleasant 67 y.o. female who I last saw on 08/24/2017.  The patient had significant symptoms from her varicose veins of the left lower extremity and had reflux involving the great saphenous vein from the thigh down to the knee.  I felt that she was a good candidate for endovenous laser ablation of the left great saphenous vein.  Patient also had some varicose veins and we were considering a follow-up visit 3 months after ablation to see if she required subsequent stab phlebectomies or sclerotherapy.  Current Outpatient Medications  Medication Sig Dispense Refill  . atorvastatin (LIPITOR) 40 MG tablet Take 2 in am and TAKE 1 TABLET BY MOUTH EVERYDAY AT BEDTIME    . beclomethasone (QVAR) 80 MCG/ACT inhaler Inhale 1 puff into the lungs 2 (two) times daily.    . Cholecalciferol (VITAMIN D3) 2000 units TABS Take 2,000 Int'l Units by mouth 1 day or 1 dose.    . diphenoxylate-atropine (LOMOTIL) 2.5-0.025 MG tablet diphenoxylate-atropine 2.5 mg-0.025 mg tablet    . ergocalciferol (VITAMIN D2) 50000 units capsule Take 50,000 Units by mouth once a week.    . montelukast (SINGULAIR) 10 MG tablet TAKE 2 TABLET BY MOUTH EVERYDAY AT BEDTIME  12  . tamsulosin (FLOMAX) 0.4 MG CAPS capsule TAKE 2 (TWO) CAPSULE AT BEDTIME  1  . Tiotropium Bromide-Olodaterol (STIOLTO RESPIMAT) 2.5-2.5 MCG/ACT AERS Inhale 2 puffs into the lungs 1 day or 1 dose.     No current facility-administered medications for this visit.     REVIEW OF SYSTEMS:  [X]  denotes positive finding, [ ]  denotes negative finding Vascular    Leg swelling    Cardiac    Chest pain or chest pressure:    Shortness of breath upon exertion:    Short of breath when lying flat:    Irregular heart rhythm:    Constitutional    Fever or chills:     PHYSICAL EXAM:    Vitals:   10/01/17 1046  BP: 138/81  Pulse: 76  Resp: 16  Temp: 97.6 F (36.4 C)  TempSrc: Oral  SpO2: 100%  Weight: 196 lb (88.9 kg)  Height: 5\' 6"  (1.676 m)    MEDICAL ISSUES:   ENDOVENOUS LASER ABLATION LEFT GREAT SAPHENOUS VEIN: The left leg was prepped and draped in the usual sterile fashion.  I interrogated the great saphenous vein with the SonoSite and decided to access the vein just above the knee.  Of note this vein was superficial.  After the skin was anesthetized, under ultrasound guidance, the vein was cannulated with a micropuncture needle and a micropuncture sheath introduced over a wire.  I then advanced the J-wire up to the proximal great saphenous vein below the saphenofemoral junction.  The 35 cm sheath was marked and then advanced over the wire and the wire and dilator removed.  The sheath was positioned just below the superficial epigastric vein.  The laser was then positioned within the sheath and the sheath retracted slightly.  I then administered tumescent anesthesia circumferentially around the vein.  Once the leg was adequately anesthetized the laser was activated after safety precautions were taken and pulled back slowly.  A total of 2140 J was used to the level of the knee.  Patient tolerated the procedure well.  No immediate complications were noted.  Deitra Mayo [facility] [phone]

## 2017-10-08 ENCOUNTER — Encounter (HOSPITAL_COMMUNITY): Payer: Medicare Other

## 2017-10-08 ENCOUNTER — Encounter: Payer: Self-pay | Admitting: Vascular Surgery

## 2017-10-08 ENCOUNTER — Ambulatory Visit (HOSPITAL_COMMUNITY)
Admission: RE | Admit: 2017-10-08 | Discharge: 2017-10-08 | Disposition: A | Discharge status: Home / Self Care | Payer: Medicare Other | Source: Ambulatory Visit | Attending: Vascular Surgery | Admitting: Vascular Surgery

## 2017-10-08 ENCOUNTER — Ambulatory Visit: Payer: Medicare Other | Admitting: Vascular Surgery

## 2017-10-08 ENCOUNTER — Ambulatory Visit (INDEPENDENT_AMBULATORY_CARE_PROVIDER_SITE_OTHER): Payer: Medicare Other | Admitting: Vascular Surgery

## 2017-10-08 VITALS — BP 132/80 | HR 76 | Temp 98.6°F | Resp 16 | Ht 66.0 in | Wt 198.0 lb

## 2017-10-08 DIAGNOSIS — Z48812 Encounter for surgical aftercare following surgery on the circulatory system: Secondary | ICD-10-CM

## 2017-10-08 DIAGNOSIS — I83893 Varicose veins of bilateral lower extremities with other complications: Secondary | ICD-10-CM | POA: Diagnosis not present

## 2017-10-08 NOTE — Progress Notes (Signed)
Vitals:   10/08/17 1033  BP: (!) 156/95  Pulse: 79  Resp: 16  Temp: 98.6 F (37 C)  TempSrc: Oral  SpO2: 100%  Weight: 198 lb (89.8 kg)  Height: 5\' 6"  (1.676 m)

## 2017-10-08 NOTE — Progress Notes (Signed)
Patient name: Karen Shannon MRN: [id number] DOB: 26-Jul-1950 Sex: female  REASON FOR VISIT:   Follow-up after endovenous laser ablation of the left great saphenous vein  HPI:   Karen Shannon is a pleasant 67 y.o. female who underwent endovenous laser ablation of the left great saphenous vein on 10/01/2017.  A total of 2140 J was used to the level of the knee.  She returns for a one-week follow-up visit.  She states that her leg does feel better.  She has been wearing her compression stockings and taking ibuprofen.  She has no specific complaints today.  Current Outpatient Medications  Medication Sig Dispense Refill  . atorvastatin (LIPITOR) 40 MG tablet Take 2 in am and TAKE 1 TABLET BY MOUTH EVERYDAY AT BEDTIME    . beclomethasone (QVAR) 80 MCG/ACT inhaler Inhale 1 puff into the lungs 2 (two) times daily.    . Cholecalciferol (VITAMIN D3) 2000 units TABS Take 2,000 Int'l Units by mouth 1 day or 1 dose.    . diphenoxylate-atropine (LOMOTIL) 2.5-0.025 MG tablet diphenoxylate-atropine 2.5 mg-0.025 mg tablet    . ergocalciferol (VITAMIN D2) 50000 units capsule Take 50,000 Units by mouth once a week.    . montelukast (SINGULAIR) 10 MG tablet TAKE 2 TABLET BY MOUTH EVERYDAY AT BEDTIME  12  . tamsulosin (FLOMAX) 0.4 MG CAPS capsule TAKE 2 (TWO) CAPSULE AT BEDTIME  1  . Tiotropium Bromide-Olodaterol (STIOLTO RESPIMAT) 2.5-2.5 MCG/ACT AERS Inhale 2 puffs into the lungs 1 day or 1 dose.     No current facility-administered medications for this visit.     REVIEW OF SYSTEMS:  [X]  denotes positive finding, [ ]  denotes negative finding Vascular    Leg swelling    Cardiac    Chest pain or chest pressure:    Shortness of breath upon exertion:    Short of breath when lying flat:    Irregular heart rhythm:    Constitutional    Fever or chills:     PHYSICAL EXAM:   Vitals:   10/08/17 1033 10/08/17 1036  BP: (!) 156/95 132/80  Pulse: 79 76  Resp: 16   Temp: 98.6 F (37 C)   TempSrc:  Oral   SpO2: 100%   Weight: 198 lb (89.8 kg)   Height: 5\' 6"  (1.676 m)     GENERAL: The patient is a well-nourished female, in no acute distress. The vital signs are documented above. CARDIOVASCULAR: There is a regular rate and rhythm. PULMONARY: There is good air exchange bilaterally without wheezing or rales. VENOUS EXAM: She has some mild bruising in the distal thigh medially.  There is a palpable cord distally.  She does have persistent spider veins and reticular veins in both legs.  DATA:   VENOUS DUPLEX: I have independently interpreted her venous duplex scan today.  This shows no evidence of deep venous thrombosis in the left lower extremity.  The great saphenous vein is noncompressible from the proximal calf to approximately 1.7 cm from the saphenofemoral junction.  MEDICAL ISSUES:   STATUS POST ENDOVENOUS LASER ABLATION OF THE LEFT GREAT SAPHENOUS VEIN: The patient is doing well status post endovenous laser ablation of the left great saphenous vein.  There is no evidence of DVT and the vein was successfully closed.  I will see her back in 3 months to determine if she might benefit from sclerotherapy of the veins remaining in her left leg.  I have encouraged her to continue wearing her thigh-high compression stockings and to take ibuprofen  as needed.  She will call sooner if she has problems.  Deitra Mayo [facility] [phone]

## 2017-11-11 ENCOUNTER — Ambulatory Visit: Payer: Medicare Other | Admitting: Vascular Surgery

## 2017-11-18 DIAGNOSIS — Z23 Encounter for immunization: Secondary | ICD-10-CM | POA: Diagnosis not present

## 2017-11-25 DIAGNOSIS — L728 Other follicular cysts of the skin and subcutaneous tissue: Secondary | ICD-10-CM | POA: Diagnosis not present

## 2017-11-25 DIAGNOSIS — L570 Actinic keratosis: Secondary | ICD-10-CM | POA: Diagnosis not present

## 2017-11-25 DIAGNOSIS — L820 Inflamed seborrheic keratosis: Secondary | ICD-10-CM | POA: Diagnosis not present

## 2017-12-11 DIAGNOSIS — J208 Acute bronchitis due to other specified organisms: Secondary | ICD-10-CM | POA: Diagnosis not present

## 2017-12-11 DIAGNOSIS — Z6834 Body mass index (BMI) 34.0-34.9, adult: Secondary | ICD-10-CM | POA: Diagnosis not present

## 2017-12-22 DIAGNOSIS — M7061 Trochanteric bursitis, right hip: Secondary | ICD-10-CM | POA: Diagnosis not present

## 2017-12-22 DIAGNOSIS — M1712 Unilateral primary osteoarthritis, left knee: Secondary | ICD-10-CM | POA: Diagnosis not present

## 2017-12-22 DIAGNOSIS — M7062 Trochanteric bursitis, left hip: Secondary | ICD-10-CM | POA: Diagnosis not present

## 2017-12-26 DIAGNOSIS — E559 Vitamin D deficiency, unspecified: Secondary | ICD-10-CM | POA: Diagnosis not present

## 2017-12-26 DIAGNOSIS — E785 Hyperlipidemia, unspecified: Secondary | ICD-10-CM | POA: Diagnosis not present

## 2017-12-26 DIAGNOSIS — I872 Venous insufficiency (chronic) (peripheral): Secondary | ICD-10-CM | POA: Diagnosis not present

## 2017-12-26 DIAGNOSIS — E0590 Thyrotoxicosis, unspecified without thyrotoxic crisis or storm: Secondary | ICD-10-CM | POA: Diagnosis not present

## 2017-12-26 DIAGNOSIS — C50919 Malignant neoplasm of unspecified site of unspecified female breast: Secondary | ICD-10-CM | POA: Diagnosis not present

## 2017-12-30 DIAGNOSIS — Z803 Family history of malignant neoplasm of breast: Secondary | ICD-10-CM | POA: Diagnosis not present

## 2017-12-30 DIAGNOSIS — Z853 Personal history of malignant neoplasm of breast: Secondary | ICD-10-CM | POA: Diagnosis not present

## 2018-01-14 ENCOUNTER — Ambulatory Visit: Payer: Medicare Other | Admitting: Vascular Surgery

## 2018-01-19 ENCOUNTER — Ambulatory Visit: Payer: Medicare Other | Admitting: *Deleted

## 2018-01-20 ENCOUNTER — Ambulatory Visit (INDEPENDENT_AMBULATORY_CARE_PROVIDER_SITE_OTHER): Payer: Medicare Other | Admitting: Vascular Surgery

## 2018-01-20 ENCOUNTER — Encounter: Payer: Self-pay | Admitting: Vascular Surgery

## 2018-01-20 VITALS — BP 140/80 | HR 94 | Temp 97.6°F | Resp 16 | Ht 66.0 in | Wt 196.0 lb

## 2018-01-20 DIAGNOSIS — Z48812 Encounter for surgical aftercare following surgery on the circulatory system: Secondary | ICD-10-CM | POA: Diagnosis not present

## 2018-01-20 DIAGNOSIS — I83813 Varicose veins of bilateral lower extremities with pain: Secondary | ICD-10-CM

## 2018-01-20 NOTE — Progress Notes (Signed)
Patient name: Karen Shannon MRN: [id number] DOB: 05-14-1950 Sex: female  REASON FOR VISIT:   Follow-up after endovenous laser ablation of the left great saphenous vein.  HPI:   Karen Shannon is a pleasant 67 y.o. female who underwent endovenous laser ablation of the left great saphenous vein on 10/01/2017.  On her follow-up visit on 10/08/2017, there was no evidence of DVT.  The great saphenous vein was closed up to approximately 1.7 cm from the saphenofemoral junction.  She had some persistent spider veins and reticular veins in both legs.  She comes in for a 13-month follow-up visit.  Since I saw her last, she does have some pain in both legs which starts at her hips and radiates down to her legs but this may be more likely related to arthritic disease.  She also has bursitis in both hips and has injections done.  She has had mild bilateral lower extremity swelling.  She has been elevating her legs daily and also wears her pantyhose compression stockings.  She states that her spider veins in the left leg do continue to bother her some.  Current Outpatient Medications  Medication Sig Dispense Refill  . atorvastatin (LIPITOR) 40 MG tablet Take 2 in am and TAKE 1 TABLET BY MOUTH EVERYDAY AT BEDTIME    . beclomethasone (QVAR) 80 MCG/ACT inhaler Inhale 1 puff into the lungs 2 (two) times daily.    . Cholecalciferol (VITAMIN D3) 2000 units TABS Take 2,000 Int'l Units by mouth 1 day or 1 dose.    . diphenoxylate-atropine (LOMOTIL) 2.5-0.025 MG tablet diphenoxylate-atropine 2.5 mg-0.025 mg tablet    . ergocalciferol (VITAMIN D2) 50000 units capsule Take 50,000 Units by mouth once a week.    . montelukast (SINGULAIR) 10 MG tablet TAKE 2 TABLET BY MOUTH EVERYDAY AT BEDTIME  12  . tamsulosin (FLOMAX) 0.4 MG CAPS capsule TAKE 2 (TWO) CAPSULE AT BEDTIME  1  . Tiotropium Bromide-Olodaterol (STIOLTO RESPIMAT) 2.5-2.5 MCG/ACT AERS Inhale 2 puffs into the lungs 1 day or 1 dose.     No current  facility-administered medications for this visit.     REVIEW OF SYSTEMS:  [X]  denotes positive finding, [ ]  denotes negative finding Vascular    Leg swelling    Cardiac    Chest pain or chest pressure:    Shortness of breath upon exertion:    Short of breath when lying flat:    Irregular heart rhythm:    Constitutional    Fever or chills:     PHYSICAL EXAM:   Vitals:   01/20/18 1453  BP: 140/80  Pulse: 94  Resp: 16  Temp: 97.6 F (36.4 C)  SpO2: 98%  Weight: 196 lb (88.9 kg)  Height: 5\' 6"  (1.676 m)    GENERAL: The patient is a well-nourished female, in no acute distress. The vital signs are documented above. CARDIOVASCULAR: There is a regular rate and rhythm. PULMONARY: There is good air exchange bilaterally without wheezing or rales. VENOUS EXAM: She does have some mild bilateral lower extremity swelling.  She has some spider veins and reticular veins bilaterally which are more significant on the left side.  DATA:   No new data  MEDICAL ISSUES:   CHRONIC VENOUS INSUFFICIENCY: The patient is doing well status post endovenous laser ablation of the left great saphenous vein.  She does have some bothersome reticular veins and spider veins bilaterally especially on the left.  I think she would be a candidate for sclerotherapy and so we will  arrange this in the near future.  We also discussed the importance of continuing with intermittent leg elevation, compression therapy, and exercise.  I will see her back as needed.  Karen Shannon [facility] [phone]

## 2018-01-21 ENCOUNTER — Ambulatory Visit: Payer: Medicare Other | Admitting: Vascular Surgery

## 2018-01-22 ENCOUNTER — Ambulatory Visit: Payer: Medicare Other

## 2018-01-22 DIAGNOSIS — J208 Acute bronchitis due to other specified organisms: Secondary | ICD-10-CM | POA: Diagnosis not present

## 2018-01-22 DIAGNOSIS — R002 Palpitations: Secondary | ICD-10-CM | POA: Diagnosis not present

## 2018-01-25 DIAGNOSIS — R002 Palpitations: Secondary | ICD-10-CM | POA: Insufficient documentation

## 2018-01-25 HISTORY — DX: Palpitations: R00.2

## 2018-01-26 ENCOUNTER — Encounter: Payer: Self-pay | Admitting: Vascular Surgery

## 2018-01-26 ENCOUNTER — Ambulatory Visit (INDEPENDENT_AMBULATORY_CARE_PROVIDER_SITE_OTHER): Payer: Medicare Other | Admitting: *Deleted

## 2018-01-26 DIAGNOSIS — I83893 Varicose veins of bilateral lower extremities with other complications: Secondary | ICD-10-CM

## 2018-01-26 DIAGNOSIS — I83813 Varicose veins of bilateral lower extremities with pain: Secondary | ICD-10-CM

## 2018-01-26 NOTE — Progress Notes (Signed)
X=.3% Sotradecol administered with a 27g butterfly.  Patient received a total of 24cc.  Veins present on thighs mostly. Combo of retics and spiders. Easy access. Tol well. Anticipate good results. Follow prn.  Photos: Yes.    Compression stockings applied: Yes.

## 2018-01-27 ENCOUNTER — Encounter: Payer: Self-pay | Admitting: Cardiology

## 2018-01-27 ENCOUNTER — Ambulatory Visit (INDEPENDENT_AMBULATORY_CARE_PROVIDER_SITE_OTHER): Payer: Medicare Other | Admitting: Cardiology

## 2018-01-27 VITALS — BP 130/66 | HR 86 | Ht 66.0 in | Wt 204.2 lb

## 2018-01-27 DIAGNOSIS — R0609 Other forms of dyspnea: Secondary | ICD-10-CM | POA: Insufficient documentation

## 2018-01-27 DIAGNOSIS — I341 Nonrheumatic mitral (valve) prolapse: Secondary | ICD-10-CM

## 2018-01-27 DIAGNOSIS — I340 Nonrheumatic mitral (valve) insufficiency: Secondary | ICD-10-CM

## 2018-01-27 DIAGNOSIS — R011 Cardiac murmur, unspecified: Secondary | ICD-10-CM | POA: Insufficient documentation

## 2018-01-27 DIAGNOSIS — R002 Palpitations: Secondary | ICD-10-CM

## 2018-01-27 DIAGNOSIS — R0600 Dyspnea, unspecified: Secondary | ICD-10-CM

## 2018-01-27 HISTORY — DX: Nonrheumatic mitral (valve) prolapse: I34.1

## 2018-01-27 HISTORY — DX: Nonrheumatic mitral (valve) insufficiency: I34.0

## 2018-01-27 HISTORY — DX: Cardiac murmur, unspecified: R01.1

## 2018-01-27 HISTORY — DX: Other forms of dyspnea: R06.09

## 2018-01-27 HISTORY — DX: Dyspnea, unspecified: R06.00

## 2018-01-27 NOTE — Progress Notes (Signed)
Cardiology Consultation:    Date:  01/27/2018   ID:  Karen Shannon, DOB October 04, 1950, MRN [id number]  PCP:  Nicoletta Dress, MD  Cardiologist:  Jenne Campus, MD   Referring MD: Nicoletta Dress, MD   Chief Complaint  Patient presents with  . Dizziness  I have palpitations and dizziness  History of Present Illness:    Karen Shannon is a 67 y.o. female who is being seen today for the evaluation of palpitations and dizziness at the request of Nicoletta Dress, MD.  Many years ago she saw Dr. Pati Gallo, she was diagnosed with mitral valve prolapse and different type of beta-blockers has been tried she responded quite well to Inderal however later this medication stopped working and trial of different medication did not help for years she is not been taking any medications for it recently she started experiencing more palpitations she described more sustained arrhythmia happening every single day lasting for up to a couple hours she gets anxious when this arrhythmia happened she does not have any provoking factors that can lead to arrhythmia there is no relieving factors that she can stop her arrhythmia.  She does have some shortness of breath when it happens no sweating no chest pain.  Described overall to have decreased ability to exercise.  She used to take care of her garden but now she cannot.  She does get swelling of lower extremity and recently some work on her veins in lower extremities has been done as recently as yesterday she got some sclerotherapy.  Described to have some swelling of lower extremities.  She cannot sleep in the bed because of scoliosis she does sleep in the recliner and she is been doing this for years.  She does have sleep apnea and she uses CPAP mask for it.  With good results Past Medical History:  Diagnosis Date  . Family history of malignant neoplasm of breast   . Malignant neoplasm of breast (female), unspecified site   . Skin cancer    basal cell  .  Varicose veins of left lower extremity     Past Surgical History:  Procedure Laterality Date  . MASTECTOMY Bilateral 2012    Current Medications: Current Meds  Medication Sig  . atorvastatin (LIPITOR) 40 MG tablet Take 40 mg by mouth daily.   . beclomethasone (QVAR) 80 MCG/ACT inhaler Inhale 1 puff into the lungs 2 (two) times daily.  . Cholecalciferol (VITAMIN D3) 2000 units TABS Take 2,000 Int'l Units by mouth 1 day or 1 dose.  . diphenoxylate-atropine (LOMOTIL) 2.5-0.025 MG tablet diphenoxylate-atropine 2.5 mg-0.025 mg tablet  . ergocalciferol (VITAMIN D2) 50000 units capsule Take 50,000 Units by mouth once a week.  . furosemide (LASIX) 40 MG tablet TAKE 2 TABLETS DAILY AND 1 TABLET IN THE EVENING AS NEEDED  . montelukast (SINGULAIR) 10 MG tablet TAKE 2 TABLET BY MOUTH EVERYDAY AT BEDTIME  . tamsulosin (FLOMAX) 0.4 MG CAPS capsule TAKE 2 (TWO) CAPSULE AT BEDTIME  . Tiotropium Bromide-Olodaterol (STIOLTO RESPIMAT) 2.5-2.5 MCG/ACT AERS Inhale 2 puffs into the lungs 1 day or 1 dose.     Allergies:   Clarithromycin; Prednisone; Sertraline; and Sertraline hcl   Social History   Socioeconomic History  . Marital status: Single    Spouse name: Not on file  . Number of children: Not on file  . Years of education: Not on file  . Highest education level: Not on file  Occupational History  . Not on file  Social Needs  .  Financial resource strain: Not on file  . Food insecurity:    Worry: Not on file    Inability: Not on file  . Transportation needs:    Medical: Not on file    Non-medical: Not on file  Tobacco Use  . Smoking status: Never Smoker  . Smokeless tobacco: Never Used  Substance and Sexual Activity  . Alcohol use: Not Currently  . Drug use: Never  . Sexual activity: Not on file  Lifestyle  . Physical activity:    Days per week: Not on file    Minutes per session: Not on file  . Stress: Not on file  Relationships  . Social connections:    Talks on phone: Not on  file    Gets together: Not on file    Attends religious service: Not on file    Active member of club or organization: Not on file    Attends meetings of clubs or organizations: Not on file    Relationship status: Not on file  Other Topics Concern  . Not on file  Social History Narrative  . Not on file     Family History: [name] history includes Breast cancer in her maternal aunt; Breast cancer (age of onset: 23) in an other family member; Liver cancer in her maternal uncle; Lung cancer in her maternal uncle; Lymphoma (age of onset: 54) in her mother; Prostate cancer in an other family member; Stomach cancer in her paternal uncle. ROS:   Please see the history of present illness.    All 14 point review of systems negative except as described per history of present illness.  EKGs/Labs/Other Studies Reviewed:    The following studies were reviewed today:   EKG:  EKG is  ordered today.  The ekg ordered today demonstrates normal sinus rhythm normal P interval normal QS complex duration morphology  Recent Labs: No results found for requested labs within last 8760 hours.  Recent Lipid Panel No results found for: CHOL, TRIG, HDL, CHOLHDL, VLDL, LDLCALC, LDLDIRECT  Physical Exam:    VS:  BP 130/66   Pulse 86   Ht 5\' 6"  (1.676 m)   Wt 204 lb 3.2 oz (92.6 kg)   SpO2 98%   BMI 32.96 kg/m     Wt Readings from Last 3 Encounters:  01/27/18 204 lb 3.2 oz (92.6 kg)  01/20/18 196 lb (88.9 kg)  10/08/17 198 lb (89.8 kg)     GEN:  Well nourished, well developed in no acute distress HEENT: Normal NECK: No JVD; No carotid bruits LYMPHATICS: No lymphadenopathy CARDIAC: RRR, no murmurs, no rubs, no gallops RESPIRATORY:  Clear to auscultation without rales, wheezing or rhonchi  ABDOMEN: Soft, non-tender, non-distended MUSCULOSKELETAL:  No edema; No deformity  SKIN: Warm and dry NEUROLOGIC:  Alert and oriented x 3 PSYCHIATRIC:  Normal affect   ASSESSMENT:    1.  Palpitations   2. Dyspnea on exertion   3. Mitral valve prolapse   4. Heart murmur    PLAN:    In order of problems listed above:  1. Palpitations.  I will ask her to wear 48 hours monitor to see exactly what confirm with me if any with dealing with.  She was told to have mitral valve prolapse on the physical examination I can hear systolic murmur actually to murmur as well as ejection murmur raising suspicion for artery stenosis is likely when his mitral regurgitation murmur echocardiogram will be done to clarify the lesions. 2. Dyspnea  on exertion again echocardiogram will be done 3. Mitral valve prolapse echocardiogram will be done 4. Dyslipidemia she does have elevated LDL.  Decision about therapy will be made based on results of the test.  In the future she may require stress testing as well. 5. Sleep apnea followed by internal medicine team and pulmonary she is using CPAP mask.   Medication Adjustments/Labs and Tests Ordered: Current medicines are reviewed at length with the patient today.  Concerns regarding medicines are outlined above.  No orders of the defined types were placed in this encounter.  No orders of the defined types were placed in this encounter.   Signed, Park Liter, MD, [facility]. 01/27/2018 11:07 AM    [place]

## 2018-01-27 NOTE — Patient Instructions (Signed)
Medication Instructions:  Your physician recommends that you continue on your current medications as directed. Please refer to the Current Medication list given to you today. If you need a refill on your cardiac medications before your next appointment, please call your pharmacy.   Lab work: None.  If you have labs (blood work) drawn today and your tests are completely normal, you will receive your results only by: Marland Kitchen MyChart Message (if you have MyChart) OR . A paper copy in the mail If you have any lab test that is abnormal or we need to change your treatment, we will call you to review the results.  Testing/Procedures: Your physician has requested that you have an echocardiogram. Echocardiography is a painless test that uses sound waves to create images of your heart. It provides your doctor with information about the size and shape of your heart and how well your heart's chambers and valves are working. This procedure takes approximately one hour. There are no restrictions for this procedure.  Your physician has recommended that you wear a holter monitor. Holter monitors are medical devices that record the heart's electrical activity. Doctors most often use these monitors to diagnose arrhythmias. Arrhythmias are problems with the speed or rhythm of the heartbeat. The monitor is a small, portable device. You can wear one while you do your normal daily activities. This is usually used to diagnose what is causing palpitations/syncope (passing out). Wear for 48 hours.     Follow-Up: At [facility], you and your health needs are our priority.  As part of our continuing mission to provide you with exceptional heart care, we have created designated Provider Care Teams.  These Care Teams include your primary Cardiologist (physician) and Advanced Practice Providers (APPs -  Physician Assistants and Nurse Practitioners) who all work together to provide you with the care you need, when you need  it. You will need a follow up appointment in 1 months.  Please call our office 2 months in advance to schedule this appointment.  You may see No primary care provider on file. or another member of our [employer] Provider Team in [place]: Shirlee More, MD . Jyl Heinz, MD  Any Other Special Instructions Will Be Listed Below (If Applicable).   Holter Monitoring A Holter monitor is a small device that is used to detect abnormal heart rhythms. It clips to your clothing and is connected by wires to flat, sticky disks (electrodes) that attach to your chest. It is worn continuously for 24-48 hours. Follow these instructions at home:  Wear your Holter monitor at all times, even while exercising and sleeping, for as long as directed by your health care provider.  Make sure that the Holter monitor is safely clipped to your clothing or close to your body as recommended by your health care provider.  Do not get the monitor or wires wet.  Do not put body lotion or moisturizer on your chest.  Keep your skin clean.  Keep a diary of your daily activities, such as walking and doing chores. If you feel that your heartbeat is abnormal or that your heart is fluttering or skipping a beat: ? Record what you are doing when it happens. ? Record what time of day the symptoms occur.  Return your Holter monitor as directed by your health care provider.  Keep all follow-up visits as directed by your health care provider. This is important. Get help right away if:  You feel lightheaded or you faint.  You  have trouble breathing.  You feel pain in your chest, upper arm, or jaw.  You feel sick to your stomach and your skin is pale, cool, or damp.  You heartbeat feels unusual or abnormal. This information is not intended to replace advice given to you by your health care provider. Make sure you discuss any questions you have with your health care provider. Document Released: 10/26/2003 Document  Revised: 07/05/2015 Document Reviewed: 09/05/2013 Elsevier Interactive Patient Education  2018 [employer].    Echocardiogram An echocardiogram is a procedure that uses painless sound waves (ultrasound) to produce an image of the heart. Images from an echocardiogram can provide important information about: Signs of coronary artery disease (CAD). Aneurysm detection. An aneurysm is a weak or damaged part of an artery wall that bulges out from the normal force of blood pumping through the body. Heart size and shape. Changes in the size or shape of the heart can be associated with certain conditions, including heart failure, aneurysm, and CAD. Heart muscle function. Heart valve function. Signs of a past heart attack. Fluid buildup around the heart. Thickening of the heart muscle. A tumor or infectious growth around the heart valves. Tell a health care provider about: Any allergies you have. All medicines you are taking, including vitamins, herbs, eye drops, creams, and over-the-counter medicines. Any blood disorders you have. Any surgeries you have had. Any medical conditions you have. Whether you are pregnant or may be pregnant. What are the risks? Generally, this is a safe procedure. However, problems may occur, including: Allergic reaction to dye (contrast) that may be used during the procedure. What happens before the procedure? No specific preparation is needed. You may eat and drink normally. What happens during the procedure?  An IV tube may be inserted into one of your veins. You may receive contrast through this tube. A contrast is an injection that improves the quality of the pictures from your heart. A gel will be applied to your chest. A wand-like tool (transducer) will be moved over your chest. The gel will help to transmit the sound waves from the transducer. The sound waves will harmlessly bounce off of your heart to allow the heart images to be captured in real-time  motion. The images will be recorded on a computer. The procedure may vary among health care providers and hospitals. What happens after the procedure? You may return to your normal, everyday life, including diet, activities, and medicines, unless your health care provider tells you not to do that. Summary An echocardiogram is a procedure that uses painless sound waves (ultrasound) to produce an image of the heart. Images from an echocardiogram can provide important information about the size and shape of your heart, heart muscle function, heart valve function, and fluid buildup around your heart. You do not need to do anything to prepare before this procedure. You may eat and drink normally. After the echocardiogram is completed, you may return to your normal, everyday life, unless your health care provider tells you not to do that. This information is not intended to replace advice given to you by your health care provider. Make sure you discuss any questions you have with your health care provider. Document Released: 01/25/2000 Document Revised: 03/01/2016 Document Reviewed: 03/01/2016 Elsevier Interactive Patient Education  2019 [employer].

## 2018-01-27 NOTE — Addendum Note (Signed)
Addended by: Ashok Norris on: 01/27/2018 11:35 AM   Modules accepted: Orders

## 2018-01-28 ENCOUNTER — Telehealth: Payer: Self-pay | Admitting: *Deleted

## 2018-01-28 NOTE — Telephone Encounter (Signed)
Not able to leave message on home phone and cell phone hung up after several rings. Wanted to reschedule monitor to another day.

## 2018-02-05 ENCOUNTER — Ambulatory Visit (HOSPITAL_BASED_OUTPATIENT_CLINIC_OR_DEPARTMENT_OTHER)
Admission: RE | Admit: 2018-02-05 | Discharge: 2018-02-05 | Disposition: A | Discharge status: Home / Self Care | Payer: Medicare Other | Source: Ambulatory Visit | Attending: Cardiology | Admitting: Cardiology

## 2018-02-05 DIAGNOSIS — R0609 Other forms of dyspnea: Secondary | ICD-10-CM

## 2018-02-05 NOTE — Progress Notes (Signed)
  Echocardiogram 2D Echocardiogram has been performed.  Darlina Sicilian M 02/05/2018, 1:32 PM

## 2018-02-11 DIAGNOSIS — J208 Acute bronchitis due to other specified organisms: Secondary | ICD-10-CM | POA: Diagnosis not present

## 2018-02-11 DIAGNOSIS — R6889 Other general symptoms and signs: Secondary | ICD-10-CM | POA: Diagnosis not present

## 2018-02-26 ENCOUNTER — Ambulatory Visit (INDEPENDENT_AMBULATORY_CARE_PROVIDER_SITE_OTHER): Payer: Medicare Other

## 2018-02-26 DIAGNOSIS — R002 Palpitations: Secondary | ICD-10-CM | POA: Diagnosis not present

## 2018-03-08 DIAGNOSIS — M25561 Pain in right knee: Secondary | ICD-10-CM | POA: Diagnosis not present

## 2018-03-08 DIAGNOSIS — M25562 Pain in left knee: Secondary | ICD-10-CM | POA: Diagnosis not present

## 2018-03-08 DIAGNOSIS — S6991XA Unspecified injury of right wrist, hand and finger(s), initial encounter: Secondary | ICD-10-CM | POA: Diagnosis not present

## 2018-03-08 DIAGNOSIS — S8991XA Unspecified injury of right lower leg, initial encounter: Secondary | ICD-10-CM | POA: Diagnosis not present

## 2018-03-08 DIAGNOSIS — S8992XA Unspecified injury of left lower leg, initial encounter: Secondary | ICD-10-CM | POA: Diagnosis not present

## 2018-03-08 DIAGNOSIS — M25531 Pain in right wrist: Secondary | ICD-10-CM | POA: Diagnosis not present

## 2018-03-09 ENCOUNTER — Other Ambulatory Visit: Payer: Medicare Other

## 2018-03-11 DIAGNOSIS — M25531 Pain in right wrist: Secondary | ICD-10-CM | POA: Diagnosis not present

## 2018-03-16 ENCOUNTER — Encounter: Payer: Self-pay | Admitting: Cardiology

## 2018-03-16 ENCOUNTER — Ambulatory Visit (INDEPENDENT_AMBULATORY_CARE_PROVIDER_SITE_OTHER): Payer: Medicare Other | Admitting: Cardiology

## 2018-03-16 VITALS — BP 110/60 | HR 85 | Ht 68.0 in | Wt 200.4 lb

## 2018-03-16 DIAGNOSIS — I341 Nonrheumatic mitral (valve) prolapse: Secondary | ICD-10-CM | POA: Diagnosis not present

## 2018-03-16 DIAGNOSIS — R011 Cardiac murmur, unspecified: Secondary | ICD-10-CM

## 2018-03-16 DIAGNOSIS — R0609 Other forms of dyspnea: Secondary | ICD-10-CM | POA: Diagnosis not present

## 2018-03-16 DIAGNOSIS — R002 Palpitations: Secondary | ICD-10-CM

## 2018-03-16 MED ORDER — METOPROLOL SUCCINATE ER 25 MG PO TB24: 25.0000 mg | ORAL_TABLET | Freq: Every day | ORAL | 1 refills | Status: DC

## 2018-03-16 NOTE — Progress Notes (Signed)
Cardiology Office Note:    Date:  03/16/2018   ID:  Karen Shannon, DOB Sep 24, 1950, MRN [id number]  PCP:  Nicoletta Dress, MD  Cardiologist:  Jenne Campus, MD    Referring MD: Nicoletta Dress, MD   Chief Complaint  Patient presents with  . 1 month follow up  Still having some palpitations  History of Present Illness:    Karen Shannon is a 68 y.o. female who was referred to me because of palpitations and she wear monitor which showed only APCs and PVCs in spite of the fact that she felt her heart racing echocardiogram was done because she does have a history of mitral valve prolapse however did not show it there was only mild mitral regurgitation we talked about this issue today and she still describe to have some episode that she feels that her heart speeding up she does have the sensation every single day I think we can try small dose of beta-blocker to see if we can suppress this sensation I told her that we will try this medication to make her symptoms better if she feels worse while taking it she should stop it.  Past Medical History:  Diagnosis Date  . Family history of malignant neoplasm of breast   . Malignant neoplasm of breast (female), unspecified site   . Skin cancer    basal cell  . Varicose veins of left lower extremity     Past Surgical History:  Procedure Laterality Date  . MASTECTOMY Bilateral 2012    Current Medications: Current Meds  Medication Sig  . atorvastatin (LIPITOR) 40 MG tablet Take 40 mg by mouth daily.   . beclomethasone (QVAR) 80 MCG/ACT inhaler Inhale 1 puff into the lungs 2 (two) times daily.  . Cholecalciferol (VITAMIN D3) 2000 units TABS Take 2,000 Int'l Units by mouth 1 day or 1 dose.  . diphenoxylate-atropine (LOMOTIL) 2.5-0.025 MG tablet diphenoxylate-atropine 2.5 mg-0.025 mg tablet  . ergocalciferol (VITAMIN D2) 50000 units capsule Take 50,000 Units by mouth once a week.  . furosemide (LASIX) 40 MG tablet TAKE 2 TABLETS DAILY  AND 1 TABLET IN THE EVENING AS NEEDED  . montelukast (SINGULAIR) 10 MG tablet TAKE 2 TABLET BY MOUTH EVERYDAY AT BEDTIME  . tamsulosin (FLOMAX) 0.4 MG CAPS capsule TAKE 2 (TWO) CAPSULE AT BEDTIME  . Tiotropium Bromide-Olodaterol (STIOLTO RESPIMAT) 2.5-2.5 MCG/ACT AERS Inhale 2 puffs into the lungs 1 day or 1 dose.     Allergies:   Clarithromycin; Prednisone; Sertraline; and Sertraline hcl   Social History   Socioeconomic History  . Marital status: Single    Spouse name: Not on file  . Number of children: Not on file  . Years of education: Not on file  . Highest education level: Not on file  Occupational History  . Not on file  Social Needs  . Financial resource strain: Not on file  . Food insecurity:    Worry: Not on file    Inability: Not on file  . Transportation needs:    Medical: Not on file    Non-medical: Not on file  Tobacco Use  . Smoking status: Never Smoker  . Smokeless tobacco: Never Used  Substance and Sexual Activity  . Alcohol use: Not Currently  . Drug use: Never  . Sexual activity: Not on file  Lifestyle  . Physical activity:    Days per week: Not on file    Minutes per session: Not on file  . Stress: Not on file  Relationships  .  Social connections:    Talks on phone: Not on file    Gets together: Not on file    Attends religious service: Not on file    Active member of club or organization: Not on file    Attends meetings of clubs or organizations: Not on file    Relationship status: Not on file  Other Topics Concern  . Not on file  Social History Narrative  . Not on file     Family History: [name] history includes Breast cancer in her maternal aunt; Breast cancer (age of onset: 58) in an other family member; Liver cancer in her maternal uncle; Lung cancer in her maternal uncle; Lymphoma (age of onset: 18) in her mother; Prostate cancer in an other family member; Stomach cancer in her paternal uncle. ROS:   Please see the history  of present illness.    All 14 point review of systems negative except as described per history of present illness  EKGs/Labs/Other Studies Reviewed:      Recent Labs: No results found for requested labs within last 8760 hours.  Recent Lipid Panel No results found for: CHOL, TRIG, HDL, CHOLHDL, VLDL, LDLCALC, LDLDIRECT  Physical Exam:    VS:  BP 110/60   Pulse 85   Ht 5\' 8"  (1.727 m)   Wt 200 lb 6.4 oz (90.9 kg)   SpO2 96%   BMI 30.47 kg/m     Wt Readings from Last 3 Encounters:  03/16/18 200 lb 6.4 oz (90.9 kg)  01/27/18 204 lb 3.2 oz (92.6 kg)  01/20/18 196 lb (88.9 kg)     GEN:  Well nourished, well developed in no acute distress HEENT: Normal NECK: No JVD; No carotid bruits LYMPHATICS: No lymphadenopathy CARDIAC: RRR, no murmurs, no rubs, no gallops RESPIRATORY:  Clear to auscultation without rales, wheezing or rhonchi  ABDOMEN: Soft, non-tender, non-distended MUSCULOSKELETAL:  No edema; No deformity  SKIN: Warm and dry LOWER EXTREMITIES: no swelling NEUROLOGIC:  Alert and oriented x 3 PSYCHIATRIC:  Normal affect   ASSESSMENT:    1. Mitral valve prolapse   2. Palpitations   3. Heart murmur   4. Dyspnea on exertion    PLAN:    In order of problems listed above:  1. Mitral valve prolapse known on echocardiogram I do not think she has any mitral valve prolapse right now. 2. Palpitations will try metoprolol succinate 25 mg daily to see if we can improve her symptoms. 3. Heart murmur very soft again only mild mitral regurgitation. 4. Dyspnea on exertion I suspect deconditioning is the reason for it.  Echocardiogram showed preserved left ventricular ejection fraction   Medication Adjustments/Labs and Tests Ordered: Current medicines are reviewed at length with the patient today.  Concerns regarding medicines are outlined above.  No orders of the defined types were placed in this encounter.  Medication changes: No orders of the defined types were placed in  this encounter.   Signed, Park Liter, MD, [facility] 03/16/2018 2:15 PM    [place]

## 2018-03-16 NOTE — Patient Instructions (Signed)
Medication Instructions:  Your physician has recommended you make the following change in your medication:   Start: Metoprolol succinate 25 mg daily  If you need a refill on your cardiac medications before your next appointment, please call your pharmacy.   Lab work: None.  If you have labs (blood work) drawn today and your tests are completely normal, you will receive your results only by: Marland Kitchen MyChart Message (if you have MyChart) OR . A paper copy in the mail If you have any lab test that is abnormal or we need to change your treatment, we will call you to review the results.  Testing/Procedures: None.   Follow-Up: At [facility], you and your health needs are our priority.  As part of our continuing mission to provide you with exceptional heart care, we have created designated Provider Care Teams.  These Care Teams include your primary Cardiologist (physician) and Advanced Practice Providers (APPs -  Physician Assistants and Nurse Practitioners) who all work together to provide you with the care you need, when you need it. You will need a follow up appointment in 3 months.  Please call our office 2 months in advance to schedule this appointment.  You may see No primary care provider on file. or another member of our [employer] Provider Team in [place]: Shirlee More, MD . Jyl Heinz, MD  Any Other Special Instructions Will Be Listed Below (If Applicable).  Metoprolol extended-release tablets What is this medicine? METOPROLOL (me TOE proe lole) is a beta-blocker. Beta-blockers reduce the workload on the heart and help it to beat more regularly. This medicine is used to treat high blood pressure and to prevent chest pain. It is also used to after a heart attack and to prevent an additional heart attack from occurring. This medicine may be used for other purposes; ask your health care provider or pharmacist if you have questions. COMMON BRAND NAME(S): toprol, Toprol XL What  should I tell my health care provider before I take this medicine? They need to know if you have any of these conditions: -diabetes -heart or vessel disease like slow heart rate, worsening heart failure, heart block, sick sinus syndrome or Raynaud's disease -kidney disease -liver disease -lung or breathing disease, like asthma or emphysema -pheochromocytoma -thyroid disease -an unusual or allergic reaction to metoprolol, other beta-blockers, medicines, foods, dyes, or preservatives -pregnant or trying to get pregnant -breast-feeding How should I use this medicine? Take this medicine by mouth with a glass of water. Follow the directions on the prescription label. Do not crush or chew. Take this medicine with or immediately after meals. Take your doses at regular intervals. Do not take more medicine than directed. Do not stop taking this medicine suddenly. This could lead to serious heart-related effects. Talk to your pediatrician regarding the use of this medicine in children. While this drug may be prescribed for children as young as 6 years for selected conditions, precautions do apply. Overdosage: If you think you have taken too much of this medicine contact a poison control center or emergency room at once. NOTE: This medicine is only for you. Do not share this medicine with others. What if I miss a dose? If you miss a dose, take it as soon as you can. If it is almost time for your next dose, take only that dose. Do not take double or extra doses. What may interact with this medicine? This medicine may interact with the following medications: -certain medicines for blood pressure, heart  disease, irregular heart beat -certain medicines for depression, like monoamine oxidase (MAO) inhibitors, fluoxetine, or paroxetine -clonidine -dobutamine -epinephrine -isoproterenol -reserpine This list may not describe all possible interactions. Give your health care provider a list of all the  medicines, herbs, non-prescription drugs, or dietary supplements you use. Also tell them if you smoke, drink alcohol, or use illegal drugs. Some items may interact with your medicine. What should I watch for while using this medicine? Visit your doctor or health care professional for regular check ups. Contact your doctor right away if your symptoms worsen. Check your blood pressure and pulse rate regularly. Ask your health care professional what your blood pressure and pulse rate should be, and when you should contact them. You may get drowsy or dizzy. Do not drive, use machinery, or do anything that needs mental alertness until you know how this medicine affects you. Do not sit or stand up quickly, especially if you are an older patient. This reduces the risk of dizzy or fainting spells. Contact your doctor if these symptoms continue. Alcohol may interfere with the effect of this medicine. Avoid alcoholic drinks. What side effects may I notice from receiving this medicine? Side effects that you should report to your doctor or health care professional as soon as possible: -allergic reactions like skin rash, itching or hives -cold or numb hands or feet -depression -difficulty breathing -faint -fever with sore throat -irregular heartbeat, chest pain -rapid weight gain -swollen legs or ankles Side effects that usually do not require medical attention (report to your doctor or health care professional if they continue or are bothersome): -anxiety or nervousness -change in sex drive or performance -dry skin -headache -nightmares or trouble sleeping -short term memory loss -stomach upset or diarrhea -unusually tired This list may not describe all possible side effects. Call your doctor for medical advice about side effects. You may report side effects to FDA at 1-800-FDA-1088. Where should I keep my medicine? Keep out of the reach of children. Store at room temperature between 15 and 30 degrees  C (59 and 86 degrees F). Throw away any unused medicine after the expiration date. NOTE: This sheet is a summary. It may not cover all possible information. If you have questions about this medicine, talk to your doctor, pharmacist, or health care provider.  2019 Elsevier/[employer] Standard (2012-10-01 14:41:37)

## 2018-03-16 NOTE — Addendum Note (Signed)
Addended by: Ashok Norris on: 03/16/2018 02:27 PM   Modules accepted: Orders

## 2018-03-18 DIAGNOSIS — G8929 Other chronic pain: Secondary | ICD-10-CM

## 2018-03-18 DIAGNOSIS — M5416 Radiculopathy, lumbar region: Secondary | ICD-10-CM | POA: Diagnosis not present

## 2018-03-18 DIAGNOSIS — M47816 Spondylosis without myelopathy or radiculopathy, lumbar region: Secondary | ICD-10-CM

## 2018-03-18 DIAGNOSIS — M4126 Other idiopathic scoliosis, lumbar region: Secondary | ICD-10-CM

## 2018-03-18 DIAGNOSIS — M4312 Spondylolisthesis, cervical region: Secondary | ICD-10-CM | POA: Diagnosis not present

## 2018-03-18 DIAGNOSIS — M542 Cervicalgia: Secondary | ICD-10-CM | POA: Diagnosis not present

## 2018-03-18 DIAGNOSIS — M5136 Other intervertebral disc degeneration, lumbar region: Secondary | ICD-10-CM | POA: Diagnosis not present

## 2018-03-18 DIAGNOSIS — M4303 Spondylolysis, cervicothoracic region: Secondary | ICD-10-CM | POA: Diagnosis not present

## 2018-03-18 DIAGNOSIS — Z853 Personal history of malignant neoplasm of breast: Secondary | ICD-10-CM

## 2018-03-18 DIAGNOSIS — M545 Low back pain, unspecified: Secondary | ICD-10-CM

## 2018-03-18 DIAGNOSIS — M47812 Spondylosis without myelopathy or radiculopathy, cervical region: Secondary | ICD-10-CM

## 2018-03-18 DIAGNOSIS — M4307 Spondylolysis, lumbosacral region: Secondary | ICD-10-CM | POA: Diagnosis not present

## 2018-03-18 DIAGNOSIS — M4608 Spinal enthesopathy, sacral and sacrococcygeal region: Secondary | ICD-10-CM | POA: Diagnosis not present

## 2018-03-18 DIAGNOSIS — M4302 Spondylolysis, cervical region: Secondary | ICD-10-CM | POA: Diagnosis not present

## 2018-03-18 DIAGNOSIS — M4186 Other forms of scoliosis, lumbar region: Secondary | ICD-10-CM | POA: Diagnosis not present

## 2018-03-18 DIAGNOSIS — M25531 Pain in right wrist: Secondary | ICD-10-CM | POA: Diagnosis not present

## 2018-03-18 DIAGNOSIS — M5030 Other cervical disc degeneration, unspecified cervical region: Secondary | ICD-10-CM

## 2018-03-18 HISTORY — DX: Other idiopathic scoliosis, lumbar region: M41.26

## 2018-03-18 HISTORY — DX: Other chronic pain: G89.29

## 2018-03-18 HISTORY — DX: Personal history of malignant neoplasm of breast: Z85.3

## 2018-03-18 HISTORY — DX: Spondylosis without myelopathy or radiculopathy, lumbar region: M47.816

## 2018-03-18 HISTORY — DX: Other cervical disc degeneration, unspecified cervical region: M50.30

## 2018-03-18 HISTORY — DX: Spondylosis without myelopathy or radiculopathy, cervical region: M47.812

## 2018-03-18 HISTORY — DX: Low back pain, unspecified: M54.50

## 2018-03-25 DIAGNOSIS — S63501D Unspecified sprain of right wrist, subsequent encounter: Secondary | ICD-10-CM | POA: Diagnosis not present

## 2018-03-25 DIAGNOSIS — M25531 Pain in right wrist: Secondary | ICD-10-CM | POA: Diagnosis not present

## 2018-03-26 DIAGNOSIS — M542 Cervicalgia: Secondary | ICD-10-CM | POA: Diagnosis not present

## 2018-03-26 DIAGNOSIS — M545 Low back pain: Secondary | ICD-10-CM | POA: Diagnosis not present

## 2018-03-26 DIAGNOSIS — M5416 Radiculopathy, lumbar region: Secondary | ICD-10-CM | POA: Diagnosis not present

## 2018-03-27 DIAGNOSIS — E0590 Thyrotoxicosis, unspecified without thyrotoxic crisis or storm: Secondary | ICD-10-CM | POA: Diagnosis not present

## 2018-03-27 DIAGNOSIS — E785 Hyperlipidemia, unspecified: Secondary | ICD-10-CM | POA: Diagnosis not present

## 2018-03-27 DIAGNOSIS — C50919 Malignant neoplasm of unspecified site of unspecified female breast: Secondary | ICD-10-CM | POA: Diagnosis not present

## 2018-03-27 DIAGNOSIS — E559 Vitamin D deficiency, unspecified: Secondary | ICD-10-CM | POA: Diagnosis not present

## 2018-03-27 DIAGNOSIS — I872 Venous insufficiency (chronic) (peripheral): Secondary | ICD-10-CM | POA: Diagnosis not present

## 2018-04-09 DIAGNOSIS — M545 Low back pain: Secondary | ICD-10-CM | POA: Diagnosis not present

## 2018-04-09 DIAGNOSIS — M7061 Trochanteric bursitis, right hip: Secondary | ICD-10-CM | POA: Diagnosis not present

## 2018-04-09 DIAGNOSIS — M47812 Spondylosis without myelopathy or radiculopathy, cervical region: Secondary | ICD-10-CM | POA: Diagnosis not present

## 2018-04-09 DIAGNOSIS — M7062 Trochanteric bursitis, left hip: Secondary | ICD-10-CM | POA: Diagnosis not present

## 2018-04-10 DIAGNOSIS — M7061 Trochanteric bursitis, right hip: Secondary | ICD-10-CM

## 2018-04-10 DIAGNOSIS — M7062 Trochanteric bursitis, left hip: Secondary | ICD-10-CM | POA: Insufficient documentation

## 2018-04-10 HISTORY — DX: Trochanteric bursitis, right hip: M70.61

## 2018-06-01 ENCOUNTER — Telehealth: Payer: Self-pay | Admitting: Cardiology

## 2018-06-01 NOTE — Telephone Encounter (Signed)
Virtual Visit Pre-Appointment Phone Call  Steps For Call:  1. Confirm consent - "In the setting of the current Covid19 crisis, you are scheduled for a (phone or video) visit with your provider on (date) at (time).  Just as we do with many in-office visits, in order for you to participate in this visit, we must obtain consent.  If you'd like, I can send this to your mychart (if signed up) or email for you to review.  Otherwise, I can obtain your verbal consent now.  All virtual visits are billed to your insurance company just like a normal visit would be.  By agreeing to a virtual visit, we'd like you to understand that the technology does not allow for your provider to perform an examination, and thus may limit your provider's ability to fully assess your condition. If your provider identifies any concerns that need to be evaluated in person, we will make arrangements to do so.  Finally, though the technology is pretty good, we cannot assure that it will always work on either your or our end, and in the setting of a video visit, we may have to convert it to a phone-only visit.  In either situation, we cannot ensure that we have a secure connection.  Are you willing to proceed?" STAFF: Did the patient verbally acknowledge consent to telehealth visit? Document YES/NO here: YES  2. Confirm the BEST phone number to call the day of the visit by including in appointment notes  3. Give patient instructions for MyChart download to smartphone OR Doximity/Doxy.me as below if video visit (depending on what platform provider is using)  4. Confirm that appointment type is correct in Epic appointment notes (VIDEO vs PHONE)  5. Advise patient to be prepared with their blood pressure, heart rate, weight, any heart rhythm information, their current medicines, and a piece of paper and pen handy for any instructions they may receive the day of their visit  6. Inform patient they will receive a phone call 15 minutes  prior to their appointment time (may be from unknown caller ID) so they should be prepared to answer    TELEPHONE CALL NOTE  Karen Shannon has been deemed a candidate for a follow-up tele-health visit to limit community exposure during the Covid-19 pandemic. I spoke with the patient via phone to ensure availability of phone/video source, confirm preferred email & phone number, and discuss instructions and expectations.  I reminded Karen Shannon to be prepared with any vital sign and/or heart rhythm information that could potentially be obtained via home monitoring, at the time of her visit. I reminded Karen Shannon to expect a phone call prior to her visit.  Karen Shannon 06/01/2018 10:40 AM   INSTRUCTIONS FOR DOWNLOADING THE MYCHART APP TO SMARTPHONE  - The patient must first make sure to have activated MyChart and know their login information - If Apple, go to [employer] and type in MyChart in the search bar and download the app. If Android, ask patient to go to [employer] and type in [place] in the search bar and download the app. The app is free but as with any other app downloads, their phone may require them to verify saved payment information or Apple/Android password.  - The patient will need to then log into the app with their MyChart username and password, and select [facility] as their healthcare provider to link the account. When it is time for your visit, go to the  MyChart app, find appointments, and click Begin Video Visit. Be sure to Select Allow for your device to access the Microphone and Camera for your visit. You will then be connected, and your provider will be with you shortly.  **If they have any issues connecting, or need assistance please contact MyChart service desk [phone])**  **If using a computer, in order to ensure the best quality for their visit they will need to use either of the following Internet Browsers: [employer],  or Google Chrome**  IF USING DOXIMITY or DOXY.ME - The patient will receive a link just prior to their visit by text.     FULL LENGTH CONSENT FOR TELE-HEALTH VISIT   I hereby voluntarily request, consent and authorize [facility] and its employed or contracted physicians, physician assistants, nurse practitioners or other licensed health care professionals (the Practitioner), to provide me with telemedicine health care services (the Services") as deemed necessary by the treating Practitioner. I acknowledge and consent to receive the Services by the Practitioner via telemedicine. I understand that the telemedicine visit will involve communicating with the Practitioner through live audiovisual communication technology and the disclosure of certain medical information by electronic transmission. I acknowledge that I have been given the opportunity to request an in-person assessment or other available alternative prior to the telemedicine visit and am voluntarily participating in the telemedicine visit.  I understand that I have the right to withhold or withdraw my consent to the use of telemedicine in the course of my care at any time, without affecting my right to future care or treatment, and that the Practitioner or I may terminate the telemedicine visit at any time. I understand that I have the right to inspect all information obtained and/or recorded in the course of the telemedicine visit and may receive copies of available information for a reasonable fee.  I understand that some of the potential risks of receiving the Services via telemedicine include:   Delay or interruption in medical evaluation due to technological equipment failure or disruption;  Information transmitted may not be sufficient (e.g. poor resolution of images) to allow for appropriate medical decision making by the Practitioner; and/or   In rare instances, security protocols could fail, causing a breach of personal health  information.  Furthermore, I acknowledge that it is my responsibility to provide information about my medical history, conditions and care that is complete and accurate to the best of my ability. I acknowledge that Practitioner's advice, recommendations, and/or decision may be based on factors not within their control, such as incomplete or inaccurate data provided by me or distortions of diagnostic images or specimens that may result from electronic transmissions. I understand that the practice of medicine is not an exact science and that Practitioner makes no warranties or guarantees regarding treatment outcomes. I acknowledge that I will receive a copy of this consent concurrently upon execution via email to the email address I last provided but may also request a printed copy by calling the office of [facility].    I understand that my insurance will be billed for this visit.   I have read or had this consent read to me.  I understand the contents of this consent, which adequately explains the benefits and risks of the Services being provided via telemedicine.   I have been provided ample opportunity to ask questions regarding this consent and the Services and have had my questions answered to my satisfaction.  I give my informed consent for the  services to be provided through the use of telemedicine in my medical care  By participating in this telemedicine visit I agree to the above.

## 2018-06-14 ENCOUNTER — Encounter: Payer: Self-pay | Admitting: Cardiology

## 2018-06-14 ENCOUNTER — Telehealth (INDEPENDENT_AMBULATORY_CARE_PROVIDER_SITE_OTHER): Payer: Medicare Other | Admitting: Cardiology

## 2018-06-14 ENCOUNTER — Other Ambulatory Visit: Payer: Self-pay

## 2018-06-14 VITALS — HR 87 | Wt 200.0 lb

## 2018-06-14 DIAGNOSIS — R0609 Other forms of dyspnea: Secondary | ICD-10-CM

## 2018-06-14 DIAGNOSIS — R002 Palpitations: Secondary | ICD-10-CM

## 2018-06-14 DIAGNOSIS — I341 Nonrheumatic mitral (valve) prolapse: Secondary | ICD-10-CM

## 2018-06-14 MED ORDER — METOPROLOL SUCCINATE ER 25 MG PO TB24: 12.5000 mg | ORAL_TABLET | Freq: Every day | ORAL | 1 refills | Status: DC

## 2018-06-14 NOTE — Progress Notes (Signed)
Virtual Visit via Video Note   This visit type was conducted due to national recommendations for restrictions regarding the COVID-19 Pandemic (e.g. social distancing) in an effort to limit this patient's exposure and mitigate transmission in our community.  Due to her co-morbid illnesses, this patient is at least at moderate risk for complications without adequate follow up.  This format is felt to be most appropriate for this patient at this time.  All issues noted in this document were discussed and addressed.  A limited physical exam was performed with this format.  Please refer to the patient's chart for her consent to telehealth for [facility].  Evaluation Performed:  Follow-up visit  This visit type was conducted due to national recommendations for restrictions regarding the COVID-19 Pandemic (e.g. social distancing).  This format is felt to be most appropriate for this patient at this time.  All issues noted in this document were discussed and addressed.  No physical exam was performed (except for noted visual exam findings with Video Visits).  Please refer to the patient's chart (MyChart message for video visits and phone note for telephone visits) for the patient's consent to telehealth for [facility].  Date:  06/14/2018  ID: Karen Shannon, DOB October 24, 1950, MRN [id number]   Patient Location: [place] [address]   Provider location:   [place] Office  PCP:  Nicoletta Dress, MD  Cardiologist:  Jenne Campus, MD     Chief Complaint: I am doing so-so  History of Present Illness:    Karen Shannon is a 68 y.o. female  who presents via audio/video conferencing for a telehealth visit today.  With palpitations also mitral valve prolapse overall those problems are mild I tried to give her a small dose of beta-blocker and see if it helps clearly it helps with palpitation but at the same time she complained of having weakness fatigue and tiredness  during the day and then during the night some nightmares.  She try to move medication from morning to the evening time which improved situation somewhat but still present.  No chest pain no tightness no squeezing no pressure no burning in the chest.  She is trying to stay with with restriction of coronavirus situation.   The patient does not have symptoms concerning for COVID-19 infection (fever, chills, cough, or new SHORTNESS OF BREATH).    Prior CV studies:   The following studies were reviewed today:       Past Medical History:  Diagnosis Date  . Family history of malignant neoplasm of breast   . Malignant neoplasm of breast (female), unspecified site   . Skin cancer    basal cell  . Varicose veins of left lower extremity     Past Surgical History:  Procedure Laterality Date  . MASTECTOMY Bilateral 2012     Current Meds  Medication Sig  . atorvastatin (LIPITOR) 40 MG tablet Take 40 mg by mouth daily.   . beclomethasone (QVAR) 80 MCG/ACT inhaler Inhale 1 puff into the lungs 2 (two) times daily.  . Cholecalciferol (VITAMIN D3) 2000 units TABS Take 2,000 Int'l Units by mouth 1 day or 1 dose.  . diphenoxylate-atropine (LOMOTIL) 2.5-0.025 MG tablet diphenoxylate-atropine 2.5 mg-0.025 mg tablet  . ergocalciferol (VITAMIN D2) 50000 units capsule Take 50,000 Units by mouth once a week.  . furosemide (LASIX) 40 MG tablet TAKE 2 TABLETS DAILY AND 1 TABLET IN THE EVENING AS NEEDED  . metoprolol succinate (TOPROL-XL) 25 MG  24 hr tablet Take 1 tablet (25 mg total) by mouth daily. Take with or immediately following a meal.  . montelukast (SINGULAIR) 10 MG tablet TAKE 2 TABLET BY MOUTH EVERYDAY AT BEDTIME  . tamsulosin (FLOMAX) 0.4 MG CAPS capsule TAKE 2 (TWO) CAPSULE AT BEDTIME  . Tiotropium Bromide-Olodaterol (STIOLTO RESPIMAT) 2.5-2.5 MCG/ACT AERS Inhale 2 puffs into the lungs 1 day or 1 dose.      Family History: [name] history includes Breast cancer in her  maternal aunt; Breast cancer (age of onset: 25) in an other family member; Liver cancer in her maternal uncle; Lung cancer in her maternal uncle; Lymphoma (age of onset: 30) in her mother; Prostate cancer in an other family member; Stomach cancer in her paternal uncle.   ROS:   Please see the history of present illness.     All other systems reviewed and are negative.   Labs/Other Tests and Data Reviewed:     Recent Labs: No results found for requested labs within last 8760 hours.  Recent Lipid Panel No results found for: CHOL, TRIG, HDL, CHOLHDL, VLDL, LDLCALC, LDLDIRECT    Exam:    Vital Signs:  Pulse 87   Wt 200 lb (90.7 kg)   SpO2 96%   BMI 30.41 kg/m     Wt Readings from Last 3 Encounters:  06/14/18 200 lb (90.7 kg)  03/16/18 200 lb 6.4 oz (90.9 kg)  01/27/18 204 lb 3.2 oz (92.6 kg)     Well nourished, well developed in no acute distress. Alert awake oriented x3 not in distress with talking over the video link and she is asymptomatic  Diagnosis for this visit:   1. Palpitations   2. Mitral valve prolapse   3. Dyspnea on exertion      ASSESSMENT & PLAN:    1.  Palpitations I will reduce dose of metoprolol from 25-12.5 every single day. 2.  Mitral valve prolapse only mild and hemodynamically insignificant.  We will continue present medications 3.  Dyspnea on exertion denies having any  COVID-19 Education: The signs and symptoms of COVID-19 were discussed with the patient and how to seek care for testing (follow up with PCP or arrange E-visit).  The importance of social distancing was discussed today.  Patient Risk:   After full review of this patients clinical status, I feel that they are at least moderate risk at this time.  Time:   Today, I have spent 21 minutes with the patient with telehealth technology discussing pt health issues.  I spent 69minutes reviewing her chart before the visit.  Visit was finished at 4:00 PM.    Medication Adjustments/Labs  and Tests Ordered: Current medicines are reviewed at length with the patient today.  Concerns regarding medicines are outlined above.  No orders of the defined types were placed in this encounter.  Medication changes: No orders of the defined types were placed in this encounter.    Disposition: Follow-up in 2 months.  Decrease dose of metoprolol to 12.5 daily  Signed, Park Liter, MD, [facility] 06/14/2018 4:01 PM    [place]

## 2018-06-14 NOTE — Patient Instructions (Signed)
Medication Instructions:  Your physician has recommended you make the following change in your medication:  Decrease: metoprolol succiante to 12.5 mg daily   If you need a refill on your cardiac medications before your next appointment, please call your pharmacy.   Lab work: None.  If you have labs (blood work) drawn today and your tests are completely normal, you will receive your results only by: Marland Kitchen MyChart Message (if you have MyChart) OR . A paper copy in the mail If you have any lab test that is abnormal or we need to change your treatment, we will call you to review the results.  Testing/Procedures: None.   Follow-Up: At [facility], you and your health needs are our priority.  As part of our continuing mission to provide you with exceptional heart care, we have created designated Provider Care Teams.  These Care Teams include your primary Cardiologist (physician) and Advanced Practice Providers (APPs -  Physician Assistants and Nurse Practitioners) who all work together to provide you with the care you need, when you need it. You will need a follow up appointment in 2 months.  Please call our office 2 months in advance to schedule this appointment.  You may see No primary care provider on file. or another member of our [employer] Provider Team in [place]: Shirlee More, MD . Jyl Heinz, MD  Any Other Special Instructions Will Be Listed Below (If Applicable).

## 2018-06-26 DIAGNOSIS — E785 Hyperlipidemia, unspecified: Secondary | ICD-10-CM | POA: Diagnosis not present

## 2018-06-26 DIAGNOSIS — E559 Vitamin D deficiency, unspecified: Secondary | ICD-10-CM | POA: Diagnosis not present

## 2018-06-26 DIAGNOSIS — C50919 Malignant neoplasm of unspecified site of unspecified female breast: Secondary | ICD-10-CM | POA: Diagnosis not present

## 2018-06-26 DIAGNOSIS — I872 Venous insufficiency (chronic) (peripheral): Secondary | ICD-10-CM | POA: Diagnosis not present

## 2018-06-26 DIAGNOSIS — E0590 Thyrotoxicosis, unspecified without thyrotoxic crisis or storm: Secondary | ICD-10-CM | POA: Diagnosis not present

## 2018-06-30 DIAGNOSIS — Z853 Personal history of malignant neoplasm of breast: Secondary | ICD-10-CM | POA: Diagnosis not present

## 2018-07-02 ENCOUNTER — Telehealth: Payer: Self-pay | Admitting: *Deleted

## 2018-07-02 MED ORDER — DILTIAZEM HCL ER COATED BEADS 120 MG PO CP24: 120.0000 mg | ORAL_CAPSULE | Freq: Every day | ORAL | 1 refills | Status: DC

## 2018-07-02 NOTE — Telephone Encounter (Signed)
Pt has been taking Metoprolol and is having nightmares and feeling sleepy. During televisit you advised her to take 1/2 at night. She is not having trouble with the sleepiness any more but is still having nightmares still and giving her panic attacks now. She says doesn't think she will be able to take this. Please advise.

## 2018-07-02 NOTE — Telephone Encounter (Signed)
Stop metoprolol, satrt cardiazem cd 120 mg po qd

## 2018-07-02 NOTE — Telephone Encounter (Signed)
Will call Cardizem CD 120 mg, qd to [facility] on [address]. Per Dr. Agustin Cree, pt is aware.

## 2018-07-12 DIAGNOSIS — M7061 Trochanteric bursitis, right hip: Secondary | ICD-10-CM | POA: Diagnosis not present

## 2018-07-12 DIAGNOSIS — M7062 Trochanteric bursitis, left hip: Secondary | ICD-10-CM | POA: Diagnosis not present

## 2018-07-24 ENCOUNTER — Other Ambulatory Visit: Payer: Self-pay | Admitting: Cardiology

## 2018-07-30 ENCOUNTER — Other Ambulatory Visit: Payer: Self-pay | Admitting: Cardiology

## 2018-07-30 DIAGNOSIS — D1801 Hemangioma of skin and subcutaneous tissue: Secondary | ICD-10-CM | POA: Diagnosis not present

## 2018-07-30 DIAGNOSIS — L821 Other seborrheic keratosis: Secondary | ICD-10-CM | POA: Diagnosis not present

## 2018-07-30 DIAGNOSIS — L820 Inflamed seborrheic keratosis: Secondary | ICD-10-CM | POA: Diagnosis not present

## 2018-07-30 DIAGNOSIS — L578 Other skin changes due to chronic exposure to nonionizing radiation: Secondary | ICD-10-CM | POA: Diagnosis not present

## 2018-08-02 DIAGNOSIS — Z1231 Encounter for screening mammogram for malignant neoplasm of breast: Secondary | ICD-10-CM | POA: Diagnosis not present

## 2018-08-02 DIAGNOSIS — Z0000 Encounter for general adult medical examination without abnormal findings: Secondary | ICD-10-CM | POA: Diagnosis not present

## 2018-08-02 DIAGNOSIS — E785 Hyperlipidemia, unspecified: Secondary | ICD-10-CM | POA: Diagnosis not present

## 2018-08-02 DIAGNOSIS — Z9481 Bone marrow transplant status: Secondary | ICD-10-CM | POA: Diagnosis not present

## 2018-08-02 DIAGNOSIS — Z1331 Encounter for screening for depression: Secondary | ICD-10-CM | POA: Diagnosis not present

## 2018-08-03 ENCOUNTER — Other Ambulatory Visit: Payer: Self-pay | Admitting: *Deleted

## 2018-08-03 MED ORDER — DILTIAZEM HCL ER COATED BEADS 120 MG PO CP24: ORAL_CAPSULE | ORAL | 0 refills | Status: DC

## 2018-08-18 ENCOUNTER — Telehealth: Payer: Medicare Other | Admitting: Cardiology

## 2018-09-07 ENCOUNTER — Telehealth: Payer: Self-pay | Admitting: Cardiology

## 2018-09-07 MED ORDER — METOPROLOL SUCCINATE ER 25 MG PO TB24: 12.5000 mg | ORAL_TABLET | Freq: Every day | ORAL | 0 refills | Status: DC

## 2018-09-07 NOTE — Telephone Encounter (Signed)
°*  STAT* If patient is at the pharmacy, call can be transferred to refill team.   1. Which medications need to be refilled? (please list name of each medication and dose if known) Metoprolol succ er 25mg  tablet  2. Which pharmacy/location (including street and city if local pharmacy) is medication to be sent to?[facility]  3. Do they need a 30 day or 90 day supply? [place]

## 2018-09-07 NOTE — Telephone Encounter (Signed)
Metoprolol succinate 12.5 mg daily refilled.

## 2018-09-10 DIAGNOSIS — S8012XA Contusion of left lower leg, initial encounter: Secondary | ICD-10-CM | POA: Diagnosis not present

## 2018-09-10 DIAGNOSIS — R600 Localized edema: Secondary | ICD-10-CM | POA: Diagnosis not present

## 2018-09-10 DIAGNOSIS — M79662 Pain in left lower leg: Secondary | ICD-10-CM | POA: Diagnosis not present

## 2018-09-25 DIAGNOSIS — R600 Localized edema: Secondary | ICD-10-CM | POA: Diagnosis not present

## 2018-09-25 DIAGNOSIS — E785 Hyperlipidemia, unspecified: Secondary | ICD-10-CM | POA: Diagnosis not present

## 2018-09-25 DIAGNOSIS — E559 Vitamin D deficiency, unspecified: Secondary | ICD-10-CM | POA: Diagnosis not present

## 2018-09-25 DIAGNOSIS — S81802A Unspecified open wound, left lower leg, initial encounter: Secondary | ICD-10-CM | POA: Diagnosis not present

## 2018-09-28 DIAGNOSIS — S81802A Unspecified open wound, left lower leg, initial encounter: Secondary | ICD-10-CM | POA: Diagnosis not present

## 2018-10-02 DIAGNOSIS — E559 Vitamin D deficiency, unspecified: Secondary | ICD-10-CM | POA: Diagnosis not present

## 2018-10-02 DIAGNOSIS — R600 Localized edema: Secondary | ICD-10-CM | POA: Diagnosis not present

## 2018-10-02 DIAGNOSIS — L03116 Cellulitis of left lower limb: Secondary | ICD-10-CM | POA: Diagnosis not present

## 2018-10-02 DIAGNOSIS — E0590 Thyrotoxicosis, unspecified without thyrotoxic crisis or storm: Secondary | ICD-10-CM | POA: Diagnosis not present

## 2018-10-02 DIAGNOSIS — C50919 Malignant neoplasm of unspecified site of unspecified female breast: Secondary | ICD-10-CM | POA: Diagnosis not present

## 2018-10-02 DIAGNOSIS — E785 Hyperlipidemia, unspecified: Secondary | ICD-10-CM | POA: Diagnosis not present

## 2018-10-02 DIAGNOSIS — S81802A Unspecified open wound, left lower leg, initial encounter: Secondary | ICD-10-CM | POA: Diagnosis not present

## 2018-10-06 DIAGNOSIS — S81802D Unspecified open wound, left lower leg, subsequent encounter: Secondary | ICD-10-CM | POA: Diagnosis not present

## 2018-10-12 DIAGNOSIS — M7061 Trochanteric bursitis, right hip: Secondary | ICD-10-CM | POA: Diagnosis not present

## 2018-10-12 DIAGNOSIS — M7062 Trochanteric bursitis, left hip: Secondary | ICD-10-CM | POA: Diagnosis not present

## 2018-10-19 DIAGNOSIS — G459 Transient cerebral ischemic attack, unspecified: Secondary | ICD-10-CM | POA: Insufficient documentation

## 2018-10-21 DIAGNOSIS — Z20828 Contact with and (suspected) exposure to other viral communicable diseases: Secondary | ICD-10-CM | POA: Diagnosis not present

## 2018-10-25 DIAGNOSIS — R51 Headache: Secondary | ICD-10-CM | POA: Diagnosis not present

## 2018-10-28 DIAGNOSIS — R42 Dizziness and giddiness: Secondary | ICD-10-CM | POA: Diagnosis not present

## 2018-10-28 DIAGNOSIS — R51 Headache: Secondary | ICD-10-CM | POA: Diagnosis not present

## 2018-11-10 DIAGNOSIS — S81802D Unspecified open wound, left lower leg, subsequent encounter: Secondary | ICD-10-CM | POA: Diagnosis not present

## 2018-11-10 DIAGNOSIS — G500 Trigeminal neuralgia: Secondary | ICD-10-CM | POA: Diagnosis not present

## 2018-11-10 DIAGNOSIS — H9202 Otalgia, left ear: Secondary | ICD-10-CM | POA: Diagnosis not present

## 2018-11-10 DIAGNOSIS — G501 Atypical facial pain: Secondary | ICD-10-CM | POA: Diagnosis not present

## 2018-11-10 DIAGNOSIS — R51 Headache: Secondary | ICD-10-CM | POA: Diagnosis not present

## 2018-11-10 DIAGNOSIS — H9192 Unspecified hearing loss, left ear: Secondary | ICD-10-CM | POA: Diagnosis not present

## 2018-11-10 NOTE — Progress Notes (Signed)
Virtual Visit via Video Note The purpose of this virtual visit is to provide medical care while limiting exposure to the novel coronavirus.    Consent was obtained for video visit:  Yes.   Answered questions that patient had about telehealth interaction:  Yes.   I discussed the limitations, risks, security and privacy concerns of performing an evaluation and management service by telemedicine. I also discussed with the patient that there may be a patient responsible charge related to this service. The patient expressed understanding and agreed to proceed.  Pt location: Home Physician Location: Home Name of referring provider:  Nicoletta Dress, MD I connected with Karen Shannon at patients initiation/request on 11/11/2018 at 11:10 AM EDT by video enabled telemedicine application and verified that I am speaking with the correct person using two identifiers. Pt MRN:  [id number] Pt DOB:  1950/10/15 Video Participants:  Karen Shannon   History of Present Illness:  Karen Shannon is a 68 year old female who presents for headache.  History supplemented by referring provider note.  She was diagnosed with "confusional migraines" in the 1970s, where she has severe migraine headache associated with brief confusion lasting 30 minutes.  She typically has a couple a year.  Initially, she was on nortriptyline for a year but stopped due to side effects.  On the night of 10/19/2018, she woke up with stinging paresthesias involving the left side of her face from forehead, over her left eye and down to the left side of her chin.  She also noted moderate left frontotemporal throbbing pain as well as left otalgia, decreased hearing in left ear, blurred vision in left eye, nausea and noted that she was drooling from the corner of both sides of her mouth.  She got up and felt dizzy and unsteady on her feet.  No slurred speech, speech disturbance, or new unilateral facial droop or extremity weakness.  No  fevers.  She was tested for COVID, which was negative.  No specific triggers.  She had an MRI of the brain without contrast on 10/29/2018 which was unremarkable.  Her PCP thought she may have had a migraine, so she was prescribed topiramate.  She stopped after one dose because it didn't make her feel well.  Due to the left otalgia and trouble hearing, she saw ENT.  Exam was negative.  She had mild residual symptoms everyday up until 2 days ago.  During that time, she did have one of her typical migraines.  She noted confusion off and on over those 3 weeks as well.  Typical migraines not associated with left facial paresthesias, drooling and dizziness.  She does have chronic neck pain with cervical spondylosis, which is treated by orthopedics.  MRI of cervical spine from 03/27/2018 showed degenerative cervical spondylosis with edematous arthritis at C1-2 on right and C3-4 on the left but no significant foraminal or spinal stenosis.  She says it has gradually gotten worse over the past few months.  She has history of mitral valve prolapse with slight murmur and palpitations which is monitored by cardiology.  Echocardiogram from 02/05/2018 was unremarkable with LV EF of 55-60% and grade 1 diastolic dysfunction.  48 hour Holter monitor in January 2020 reportedly revealed no significant arrhythmias.  She was started on metoprolol around that time.    She takes atorvastatin.  Blood pressure normal.  Past Medical History: Past Medical History:  Diagnosis Date  . Family history of malignant neoplasm of breast   . Malignant neoplasm of  breast (female), unspecified site   . Skin cancer    basal cell  . Varicose veins of left lower extremity   MVP  Medications: Outpatient Encounter Medications as of 11/11/2018  Medication Sig  . atorvastatin (LIPITOR) 40 MG tablet Take 40 mg by mouth daily.   . beclomethasone (QVAR) 80 MCG/ACT inhaler Inhale 1 puff into the lungs 2 (two) times daily.  . Cholecalciferol  (VITAMIN D3) 2000 units TABS Take 2,000 Int'l Units by mouth 1 day or 1 dose.  . diltiazem (CARDIZEM CD) 120 MG 24 hr capsule TAKE 1 CAPSULE BY MOUTH EVERY DAY  . diphenoxylate-atropine (LOMOTIL) 2.5-0.025 MG tablet diphenoxylate-atropine 2.5 mg-0.025 mg tablet  . ergocalciferol (VITAMIN D2) 50000 units capsule Take 50,000 Units by mouth once a week.  . furosemide (LASIX) 40 MG tablet TAKE 2 TABLETS DAILY AND 1 TABLET IN THE EVENING AS NEEDED  . metoprolol succinate (TOPROL-XL) 25 MG 24 hr tablet Take 0.5 tablets (12.5 mg total) by mouth daily. Take with or immediately following a meal.  . montelukast (SINGULAIR) 10 MG tablet TAKE 2 TABLET BY MOUTH EVERYDAY AT BEDTIME  . tamsulosin (FLOMAX) 0.4 MG CAPS capsule TAKE 2 (TWO) CAPSULE AT BEDTIME  . Tiotropium Bromide-Olodaterol (STIOLTO RESPIMAT) 2.5-2.5 MCG/ACT AERS Inhale 2 puffs into the lungs 1 day or 1 dose.   No facility-administered encounter medications on file as of 11/11/2018.     Allergies: Allergies  Allergen Reactions  . Clarithromycin Diarrhea and Other (See Comments)  . Prednisone Other (See Comments)  . Sertraline Diarrhea  . Sertraline Hcl Diarrhea    Family History: Family History  Problem Relation Age of Onset  . Lymphoma Mother 35       deceased 42  . Breast cancer Maternal Aunt        Dx 64s; Deceased 24s  . Lung cancer Maternal Uncle   . Stomach cancer Paternal Uncle        deceased 61  . Liver cancer Maternal Uncle   . Prostate cancer Other        3 mat cousins  . Breast cancer Other 2       mother's mat half-sister    Social History: Social History   Socioeconomic History  . Marital status: Single    Spouse name: Not on file  . Number of children: Not on file  . Years of education: Not on file  . Highest education level: Not on file  Occupational History  . Not on file  Social Needs  . Financial resource strain: Not on file  . Food insecurity    Worry: Not on file    Inability: Not on file  .  Transportation needs    Medical: Not on file    Non-medical: Not on file  Tobacco Use  . Smoking status: Never Smoker  . Smokeless tobacco: Never Used  Substance and Sexual Activity  . Alcohol use: Not Currently  . Drug use: Never  . Sexual activity: Not on file  Lifestyle  . Physical activity    Days per week: Not on file    Minutes per session: Not on file  . Stress: Not on file  Relationships  . Social Herbalist on phone: Not on file    Gets together: Not on file    Attends religious service: Not on file    Active member of club or organization: Not on file    Attends meetings of clubs or organizations: Not on file  Relationship status: Not on file  . Intimate partner violence    Fear of current or ex partner: Not on file    Emotionally abused: Not on file    Physically abused: Not on file    Forced sexual activity: Not on file  Other Topics Concern  . Not on file  Social History Narrative  . Not on file    Observations/Objective:   Blood pressure 114/77, pulse 70, height 5\' 6"  (1.676 m), weight 192 lb (87.1 kg), SpO2 97 %. No acute distress.  Alert and oriented.  Speech fluent and not dysarthric.  Language intact.  Eyes orthophoric on primary gaze.  Face symmetric.  Assessment and Plan:   1.  Neurologic episode.  Differential diagnosis includes cerebrovascular event associated with migraine or prolonged migraine with aura.  Atypical presentation for both, as symptoms persisted for 3 weeks.  As she had some focal associated symptoms never before associated with her migraines, I would favor treating for secondary stroke prevention.  She has had a cardiac workup less than a year ago.  1.  Check carotid doppler 2.  Start ASA 81mg  daily  3.  She is already on statin therapy 4.  I would defer treatment of migraine as symptoms have steadily been dissipating.  Will continue to monitor 5.  Follow up in 3 months.   Follow Up Instructions:    -I discussed the  assessment and treatment plan with the patient. The patient was provided an opportunity to ask questions and all were answered. The patient agreed with the plan and demonstrated an understanding of the instructions.   The patient was advised to call back or seek an in-person evaluation if the symptoms worsen or if the condition fails to improve as anticipated.   Dudley Major, DO

## 2018-11-11 ENCOUNTER — Other Ambulatory Visit: Payer: Self-pay

## 2018-11-11 ENCOUNTER — Telehealth (INDEPENDENT_AMBULATORY_CARE_PROVIDER_SITE_OTHER): Payer: Medicare Other | Admitting: Neurology

## 2018-11-11 ENCOUNTER — Encounter: Payer: Self-pay | Admitting: Neurology

## 2018-11-11 ENCOUNTER — Other Ambulatory Visit: Payer: Self-pay | Admitting: *Deleted

## 2018-11-11 VITALS — BP 114/77 | HR 70 | Ht 66.0 in | Wt 192.0 lb

## 2018-11-11 DIAGNOSIS — R2990 Unspecified symptoms and signs involving the nervous system: Secondary | ICD-10-CM

## 2018-11-11 DIAGNOSIS — G43109 Migraine with aura, not intractable, without status migrainosus: Secondary | ICD-10-CM | POA: Diagnosis not present

## 2018-11-15 DIAGNOSIS — B9689 Other specified bacterial agents as the cause of diseases classified elsewhere: Secondary | ICD-10-CM | POA: Diagnosis not present

## 2018-11-15 DIAGNOSIS — J208 Acute bronchitis due to other specified organisms: Secondary | ICD-10-CM | POA: Diagnosis not present

## 2018-11-16 ENCOUNTER — Telehealth: Payer: Self-pay | Admitting: Neurology

## 2018-11-16 NOTE — Telephone Encounter (Signed)
Patient is calling in because she hasn't heard from anyone with scheduling the neck ultrasound that was supposed to be order. Thanks!

## 2018-11-17 NOTE — Telephone Encounter (Signed)
Note order placed on 10/1 for US Carotid Bilateral at [facility]. Call placed to [facility] to inquire about patient being scheduled. They have order and will call patient this afternoon to arrange. Patient called and made aware.

## 2018-11-24 ENCOUNTER — Ambulatory Visit
Admission: RE | Admit: 2018-11-24 | Discharge: 2018-11-24 | Disposition: A | Discharge status: Home / Self Care | Payer: Medicare Other | Source: Ambulatory Visit | Attending: Neurology | Admitting: Neurology

## 2018-11-24 DIAGNOSIS — Z8673 Personal history of transient ischemic attack (TIA), and cerebral infarction without residual deficits: Secondary | ICD-10-CM | POA: Diagnosis not present

## 2018-11-24 DIAGNOSIS — I6523 Occlusion and stenosis of bilateral carotid arteries: Secondary | ICD-10-CM | POA: Diagnosis not present

## 2018-11-24 DIAGNOSIS — R2990 Unspecified symptoms and signs involving the nervous system: Secondary | ICD-10-CM

## 2018-11-25 ENCOUNTER — Telehealth: Payer: Self-pay

## 2018-11-25 NOTE — Telephone Encounter (Signed)
-----   Message from Pieter Partridge, DO sent at 11/25/2018  6:57 AM EDT ----- Carotid ultrasound looks oka

## 2018-11-25 NOTE — Telephone Encounter (Signed)
She has chronic neck pain.  I defer to her orthopedist

## 2018-11-25 NOTE — Telephone Encounter (Signed)
Message left normal

## 2018-11-25 NOTE — Telephone Encounter (Signed)
Spoke with patient she was made aware of provider response and will contact orthopedist

## 2018-11-25 NOTE — Telephone Encounter (Signed)
Spoke with patient she was made aware of results.  She is wonder if she needs to have MRI that was discuss. Do not see in last office visit.  Pt c/o of still having pain down left side of neck, left ear pain

## 2018-12-02 DIAGNOSIS — H903 Sensorineural hearing loss, bilateral: Secondary | ICD-10-CM | POA: Diagnosis not present

## 2018-12-03 DIAGNOSIS — M542 Cervicalgia: Secondary | ICD-10-CM | POA: Diagnosis not present

## 2018-12-03 DIAGNOSIS — Z23 Encounter for immunization: Secondary | ICD-10-CM | POA: Diagnosis not present

## 2018-12-03 DIAGNOSIS — G8929 Other chronic pain: Secondary | ICD-10-CM | POA: Diagnosis not present

## 2018-12-03 DIAGNOSIS — M47812 Spondylosis without myelopathy or radiculopathy, cervical region: Secondary | ICD-10-CM | POA: Diagnosis not present

## 2018-12-20 ENCOUNTER — Telehealth: Payer: Self-pay | Admitting: Neurology

## 2018-12-20 DIAGNOSIS — G459 Transient cerebral ischemic attack, unspecified: Secondary | ICD-10-CM | POA: Diagnosis not present

## 2018-12-20 NOTE — Telephone Encounter (Signed)
The following message was left with [facility] on 12/17/18 at 12:25 PM.  Caller has an appointment in December. She recently had a stroke. She has ear and neck pain ever since she had the stroke and was referred to Orthopedic doctor and started on Meloxicam. Plus she is having memory issues. Had CVA September 8th. When driving the other day, she couldn't remember where she was going. Patient acted confused and disoriented on the call.  Patient advised to see health care professional within 4 hours (or PCP triage).

## 2018-12-20 NOTE — Telephone Encounter (Signed)
Pt has follow up on 02/01/19 Please advise

## 2018-12-20 NOTE — Telephone Encounter (Signed)
We can certainly move up her appointment.  However, if she needs to be seen today (as recommended by [facility]), then she needs to go to PCP triage.  She is not to drive until further notice.

## 2018-12-20 NOTE — Progress Notes (Signed)
NEUROLOGY FOLLOW UP OFFICE NOTE  Karen Shannon [id number]  HISTORY OF PRESENT ILLNESS: Karen Shannon is a 68 year old female who follows up for migraine.  UPDATE: Last month, she was advised to start ASA 81mg  daily. Carotid doppler from 11/24/2018 showed only minimal heterogenous plaque with no hemodynamically significant stenosis.  Since 9/8, she has not felt well.  She feels nauseous after she eats.    On Thursday, she was driving to the grocery store and then she started having a "fuzzy feeling" on her forehead and she suddenly became confused and was disoriented.  She didn't know where she was.  She had to ask her passenger where to go.  It lasted about 5 minutes.  Since then, she has had residual symptoms.  She is sometimes having slurred speech.  She has short term memory problems.  She states this is different than her prior migraines with confusion.  She still has left sided neck pain and ear and her orthopedist started her on Mobic which was ineffective.  Left eye is still blurred.  She has not seen the eye doctor.  She has anxiety attacks, particularly if she cannot remember how to do something.  Current medications:  Toprol XL; Mobic  HISTORY: She was diagnosed with "confusional migraines" in the 1970s, where she has severe migraine headache associated with brief confusion lasting 30 minutes.  She typically has a couple a year.  Initially, she was on nortriptyline for a year but stopped due to side effects.  On the night of 10/19/2018, she woke up with stinging paresthesias involving the left side of her face from forehead, over her left eye and down to the left side of her chin.  She also noted moderate left frontotemporal throbbing pain as well as left otalgia, decreased hearing in left ear, blurred vision in left eye, nausea and noted that she was drooling from the corner of both sides of her mouth.  She got up and felt dizzy and unsteady on her feet.  No slurred speech,  speech disturbance, or new unilateral facial droop or extremity weakness.  No fevers.  She was tested for COVID, which was negative.  No specific triggers.  She had an MRI of the brain without contrast on 10/29/2018 which was unremarkable.  Her PCP thought she may have had a migraine, so she was prescribed topiramate.  She stopped after one dose because it didn't make her feel well.  Due to the left otalgia and trouble hearing, she saw ENT.  Exam was negative.  She had mild residual symptoms everyday up until for the next 2 weeks.  During that time, she did have one of her typical migraines.  She noted confusion off and on over those 3 weeks as well.  Typical migraines not associated with left facial paresthesias, drooling and dizziness.  She does have chronic neck pain with cervical spondylosis, which is treated by orthopedics.  MRI of cervical spine from 03/27/2018 showed degenerative cervical spondylosis with edematous arthritis at C1-2 on right and C3-4 on the left but no significant foraminal or spinal stenosis.  She says it has gradually gotten worse over the past few months.  MRI of lumbar spine from same day showed scoliosis as well as multilevel degenerative changes including foraminal narrowing on left that may be affecting left L3 nerve root and right foraminal narrowing at L4-5 and L5-S1, possibly affecting right L5 nerve.  She has history of mitral valve prolapse with slight murmur and palpitations which  is monitored by cardiology.  Echocardiogram from 02/05/2018 was unremarkable with LV EF of 55-60% and grade 1 diastolic dysfunction.  48 hour Holter monitor in January 2020 reportedly revealed no significant arrhythmias.  She was started on metoprolol around that time.    Past migraine preventatives:  Nortriptyline, topiramate  PAST MEDICAL HISTORY: Past Medical History:  Diagnosis Date  . Family history of malignant neoplasm of breast   . Malignant neoplasm of breast (female), unspecified  site   . Skin cancer    basal cell  . Varicose veins of left lower extremity     MEDICATIONS: Current Outpatient Medications on File Prior to Visit  Medication Sig Dispense Refill  . atorvastatin (LIPITOR) 40 MG tablet Take 40 mg by mouth daily.     . beclomethasone (QVAR) 80 MCG/ACT inhaler Inhale 1 puff into the lungs 2 (two) times daily.    . Cholecalciferol (VITAMIN D3) 2000 units TABS Take 2,000 Int'l Units by mouth 1 day or 1 dose.    . diphenoxylate-atropine (LOMOTIL) 2.5-0.025 MG tablet diphenoxylate-atropine 2.5 mg-0.025 mg tablet    . ergocalciferol (VITAMIN D2) 50000 units capsule Take 50,000 Units by mouth once a week.    . furosemide (LASIX) 40 MG tablet TAKE 2 TABLETS DAILY AND 1 TABLET IN THE EVENING AS NEEDED  3  . metoprolol succinate (TOPROL-XL) 25 MG 24 hr tablet Take 0.5 tablets (12.5 mg total) by mouth daily. Take with or immediately following a meal. 60 tablet 0  . montelukast (SINGULAIR) 10 MG tablet Take 1 tab daily  12  . tamsulosin (FLOMAX) 0.4 MG CAPS capsule TAKE 2 (TWO) CAPSULE AT BEDTIME  1  . Tiotropium Bromide-Olodaterol (STIOLTO RESPIMAT) 2.5-2.5 MCG/ACT AERS Inhale 2 puffs into the lungs 1 day or 1 dose.     No current facility-administered medications on file prior to visit.     ALLERGIES: Allergies  Allergen Reactions  . Clarithromycin Diarrhea and Other (See Comments)  . Prednisone Other (See Comments)  . Sertraline Diarrhea  . Sertraline Hcl Diarrhea    FAMILY HISTORY: Family History  Problem Relation Age of Onset  . Lymphoma Mother 51       deceased 27  . Breast cancer Maternal Aunt        Dx 46s; Deceased 60s  . Lung cancer Maternal Uncle   . Stomach cancer Paternal Uncle        deceased 21  . Liver cancer Maternal Uncle   . Prostate cancer Other        3 mat cousins  . Breast cancer Other 24       mother's mat half-sister   SOCIAL HISTORY: Social History   Socioeconomic History  . Marital status: Single    Spouse name: Not  on file  . Number of children: Not on file  . Years of education: Not on file  . Highest education level: Not on file  Occupational History  . Not on file  Social Needs  . Financial resource strain: Not on file  . Food insecurity    Worry: Not on file    Inability: Not on file  . Transportation needs    Medical: Not on file    Non-medical: Not on file  Tobacco Use  . Smoking status: Never Smoker  . Smokeless tobacco: Never Used  Substance and Sexual Activity  . Alcohol use: Not Currently  . Drug use: Never  . Sexual activity: Not on file  Lifestyle  . Physical activity  Days per week: Not on file    Minutes per session: Not on file  . Stress: Not on file  Relationships  . Social Herbalist on phone: Not on file    Gets together: Not on file    Attends religious service: Not on file    Active member of club or organization: Not on file    Attends meetings of clubs or organizations: Not on file    Relationship status: Not on file  . Intimate partner violence    Fear of current or ex partner: Not on file    Emotionally abused: Not on file    Physically abused: Not on file    Forced sexual activity: Not on file  Other Topics Concern  . Not on file  Social History Narrative  . Not on file    REVIEW OF SYSTEMS: Constitutional: No fevers, chills, or sweats, no generalized fatigue, change in appetite Eyes: No visual changes, double vision, eye pain Ear, nose and throat: No hearing loss, ear pain, nasal congestion, sore throat Cardiovascular: No chest pain, palpitations Respiratory:  No shortness of breath at rest or with exertion, wheezes GastrointestinaI: No nausea, vomiting, diarrhea, abdominal pain, fecal incontinence Genitourinary:  No dysuria, urinary retention or frequency Musculoskeletal:  No neck pain, back pain Integumentary: No rash, pruritus, skin lesions Neurological: as above Psychiatric: No depression, insomnia, anxiety Endocrine: No  palpitations, fatigue, diaphoresis, mood swings, change in appetite, change in weight, increased thirst Hematologic/Lymphatic:  No purpura, petechiae. Allergic/Immunologic: no itchy/runny eyes, nasal congestion, recent allergic reactions, rashes  PHYSICAL EXAM: Blood pressure 112/78, pulse 90, height 5\' 7"  (1.702 m), weight 188 lb (85.3 kg), SpO2 97 %. General: No acute distress.  Patient appears well-groomed.   Head:  Normocephalic/atraumatic Eyes:  Fundi examined but not visualized Neck: supple, no paraspinal tenderness, full range of motion Heart:  Regular rate and rhythm Lungs:  Clear to auscultation bilaterally Back: No paraspinal tenderness Neurological Exam: alert and oriented to person, place, and time. Attention span and concentration intact, recent and remote memory intact, fund of knowledge intact.  Speech fluent and not dysarthric, language intact.  CN II-XII intact. Bulk and tone normal, muscle with giveway weakness of left lower extremity.  Otherwise, 5/5 throughout.  Sensation to light touch  intact.  Deep tendon reflexes 2+ throughout, toes downgoing.  Finger to nose and heel to shin testing intact.  Gait cautious, Romberg negative.  IMPRESSION: Various symptomatology: 1.  Prolonged memory/cognitive deficits.  Ongoing for 2 weeks now following acute episode of confusion.  States this is different than prior confusional episodes related to migraine. 2.  Left sided neck and ear pain, likely related to arthritis of cervical spine 3.  Decreased visual acuity of left eye. 4.  Left sided hearing loss.  ENT evaluation unremarkable.  It is difficult to ascertain etiology of her symptoms (other than the neck and ear pain).  She has history of migraine with aura and there is prolonged migraine aura but it is unusual to have such prolonged symptoms lasting this long.  Previous MRI of brain and carotid doppler reveal no explanation for left sided visual deficits.  Previous MRI and ENT  evaluation reportedly revealed no explanation for hearing loss.  As she has had another acute neurologic event with residual symptoms, I think we need to repeat MRI.  PLAN: 1.  MRI of brain with and without contrast and MRA of head 2.  Refer to Dr. Marshall Cork at [facility]  for evaluation of decreased visual acuity in left eye. 3.  Refer to Dr. Hazle Coca for neuropsychological testing. 4.  Further recommendations pending results.  In the meantime, will have her follow up in 3 to 4 months.  25 minutes spent face to face with patient, over 50% spent discussing management and differential diagnoses.  Metta Clines, DO  CC:  Nelda Bucks, MD

## 2018-12-20 NOTE — Telephone Encounter (Signed)
Called patient no answer left message with provider response on voice mail. She can make sooner appt with Dr. Tomi Likens is need be

## 2018-12-21 ENCOUNTER — Encounter: Payer: Self-pay | Admitting: Neurology

## 2018-12-21 ENCOUNTER — Other Ambulatory Visit: Payer: Self-pay

## 2018-12-21 ENCOUNTER — Ambulatory Visit (INDEPENDENT_AMBULATORY_CARE_PROVIDER_SITE_OTHER): Payer: Medicare Other | Admitting: Neurology

## 2018-12-21 VITALS — BP 112/78 | HR 90 | Ht 67.0 in | Wt 188.0 lb

## 2018-12-21 DIAGNOSIS — I6389 Other cerebral infarction: Secondary | ICD-10-CM

## 2018-12-21 DIAGNOSIS — R2681 Unsteadiness on feet: Secondary | ICD-10-CM | POA: Diagnosis not present

## 2018-12-21 DIAGNOSIS — R413 Other amnesia: Secondary | ICD-10-CM | POA: Diagnosis not present

## 2018-12-21 DIAGNOSIS — H539 Unspecified visual disturbance: Secondary | ICD-10-CM | POA: Diagnosis not present

## 2018-12-21 DIAGNOSIS — R410 Disorientation, unspecified: Secondary | ICD-10-CM

## 2018-12-21 DIAGNOSIS — R2990 Unspecified symptoms and signs involving the nervous system: Secondary | ICD-10-CM

## 2018-12-21 NOTE — Addendum Note (Signed)
Addended by: Ranae Plumber on: 12/21/2018 01:27 PM   Modules accepted: Orders

## 2018-12-21 NOTE — Patient Instructions (Addendum)
1.  Will check MRI of brain with and without contrast and MRA of head 2.  I will refer you to Dr. Marshall Cork at [facility]-Phone: [phone] 3.  I will refer you to Dr. Hazle Coca for neurocognitive testing. 4.  Further recommendations pending results.  In meantime, follow up in 4 months.  A referral to [facility] has been placed for your MRI someone will contact you directly to schedule your appt. They are located at [place]. Please contact them directly by calling [phone] with any questions regarding your referral.  You have been referred for a neurocognitive evaluation in our office.   The evaluation has two parts.   . The first part of the evaluation is a clinical interview with the neuropsychologist (Dr. Melvyn Novas or Dr. Nicole Kindred). Please bring someone with you to this appointment if possible, as it is helpful for the doctor to hear from both you and another adult who knows you well.   . The second part of the evaluation is testing with the doctor's technician Hinton Dyer or Maudie Mercury). The testing includes a variety of tasks- mostly question-and-answer, some paper-and-pencil. There is nothing you need to do to prepare for this appointment, but having a good night's sleep prior to the testing, taking medications as you normally would, and bringing eyeglasses and hearing aids (if you wear them), is advised. Please make sure that you wear a mask to the appointment.  Please note: We have to reserve several hours of the neuropsychologist's time and the psychometrician's time for your evaluation appointment. As such, please note that there is a No-Show fee of $100. If you are unable to attend any of your appointments, please contact our office as soon as possible to reschedule.

## 2018-12-23 ENCOUNTER — Encounter: Payer: Self-pay | Admitting: Neurology

## 2018-12-23 DIAGNOSIS — Z853 Personal history of malignant neoplasm of breast: Secondary | ICD-10-CM | POA: Diagnosis not present

## 2018-12-29 DIAGNOSIS — R112 Nausea with vomiting, unspecified: Secondary | ICD-10-CM | POA: Diagnosis not present

## 2018-12-29 DIAGNOSIS — K5730 Diverticulosis of large intestine without perforation or abscess without bleeding: Secondary | ICD-10-CM | POA: Diagnosis not present

## 2018-12-29 DIAGNOSIS — R10819 Abdominal tenderness, unspecified site: Secondary | ICD-10-CM | POA: Diagnosis not present

## 2018-12-29 DIAGNOSIS — C50112 Malignant neoplasm of central portion of left female breast: Secondary | ICD-10-CM | POA: Diagnosis not present

## 2019-01-01 DIAGNOSIS — C50919 Malignant neoplasm of unspecified site of unspecified female breast: Secondary | ICD-10-CM | POA: Diagnosis not present

## 2019-01-01 DIAGNOSIS — E559 Vitamin D deficiency, unspecified: Secondary | ICD-10-CM | POA: Diagnosis not present

## 2019-01-01 DIAGNOSIS — I872 Venous insufficiency (chronic) (peripheral): Secondary | ICD-10-CM | POA: Diagnosis not present

## 2019-01-01 DIAGNOSIS — E785 Hyperlipidemia, unspecified: Secondary | ICD-10-CM | POA: Diagnosis not present

## 2019-01-01 DIAGNOSIS — E0590 Thyrotoxicosis, unspecified without thyrotoxic crisis or storm: Secondary | ICD-10-CM | POA: Diagnosis not present

## 2019-01-03 DIAGNOSIS — H40013 Open angle with borderline findings, low risk, bilateral: Secondary | ICD-10-CM | POA: Diagnosis not present

## 2019-01-03 DIAGNOSIS — H5319 Other subjective visual disturbances: Secondary | ICD-10-CM | POA: Diagnosis not present

## 2019-01-10 DIAGNOSIS — H04123 Dry eye syndrome of bilateral lacrimal glands: Secondary | ICD-10-CM | POA: Diagnosis not present

## 2019-01-10 DIAGNOSIS — H02403 Unspecified ptosis of bilateral eyelids: Secondary | ICD-10-CM | POA: Diagnosis not present

## 2019-01-10 DIAGNOSIS — H5319 Other subjective visual disturbances: Secondary | ICD-10-CM | POA: Diagnosis not present

## 2019-01-10 DIAGNOSIS — H40013 Open angle with borderline findings, low risk, bilateral: Secondary | ICD-10-CM | POA: Diagnosis not present

## 2019-01-13 ENCOUNTER — Other Ambulatory Visit: Payer: Self-pay

## 2019-01-13 ENCOUNTER — Ambulatory Visit
Admission: RE | Admit: 2019-01-13 | Discharge: 2019-01-13 | Disposition: A | Discharge status: Home / Self Care | Payer: Medicare Other | Source: Ambulatory Visit | Attending: Neurology | Admitting: Neurology

## 2019-01-13 DIAGNOSIS — M7061 Trochanteric bursitis, right hip: Secondary | ICD-10-CM | POA: Diagnosis not present

## 2019-01-13 DIAGNOSIS — R410 Disorientation, unspecified: Secondary | ICD-10-CM

## 2019-01-13 DIAGNOSIS — I6389 Other cerebral infarction: Secondary | ICD-10-CM

## 2019-01-13 DIAGNOSIS — M7062 Trochanteric bursitis, left hip: Secondary | ICD-10-CM | POA: Diagnosis not present

## 2019-01-13 DIAGNOSIS — R413 Other amnesia: Secondary | ICD-10-CM

## 2019-01-13 MED ORDER — GADOBENATE DIMEGLUMINE 529 MG/ML IV SOLN
17.0000 mL | Freq: Once | INTRAVENOUS | Status: AC | PRN
  Administered 2019-01-13: 17 mL via INTRAVENOUS

## 2019-01-14 ENCOUNTER — Telehealth: Payer: Self-pay | Admitting: Neurology

## 2019-01-14 NOTE — Telephone Encounter (Signed)
Notes recorded by Pieter Partridge, DO on 01/13/2019 at 4:16 PM EST  MRI of brain and blood vessels are normal  Return patient call she was informed of results and understands

## 2019-01-14 NOTE — Telephone Encounter (Signed)
Patient states that someone called from our office maybe about MRI results please call

## 2019-01-17 ENCOUNTER — Other Ambulatory Visit: Payer: Self-pay

## 2019-01-17 ENCOUNTER — Encounter: Payer: Self-pay | Admitting: Cardiology

## 2019-01-17 ENCOUNTER — Ambulatory Visit (INDEPENDENT_AMBULATORY_CARE_PROVIDER_SITE_OTHER): Payer: Medicare Other | Admitting: Cardiology

## 2019-01-17 VITALS — BP 120/80 | HR 79 | Ht 67.0 in | Wt 189.4 lb

## 2019-01-17 DIAGNOSIS — R0609 Other forms of dyspnea: Secondary | ICD-10-CM

## 2019-01-17 DIAGNOSIS — I341 Nonrheumatic mitral (valve) prolapse: Secondary | ICD-10-CM

## 2019-01-17 DIAGNOSIS — R0600 Dyspnea, unspecified: Secondary | ICD-10-CM

## 2019-01-17 DIAGNOSIS — G459 Transient cerebral ischemic attack, unspecified: Secondary | ICD-10-CM

## 2019-01-17 DIAGNOSIS — R002 Palpitations: Secondary | ICD-10-CM

## 2019-01-17 HISTORY — DX: Transient cerebral ischemic attack, unspecified: G45.9

## 2019-01-17 NOTE — Patient Instructions (Signed)
Medication Instructions:  Your physician recommends that you continue on your current medications as directed. Please refer to the Current Medication list given to you today.  *If you need a refill on your cardiac medications before your next appointment, please call your pharmacy*  Lab Work: None.  If you have labs (blood work) drawn today and your tests are completely normal, you will receive your results only by: Marland Kitchen MyChart Message (if you have MyChart) OR . A paper copy in the mail If you have any lab test that is abnormal or we need to change your treatment, we will call you to review the results.  Testing/Procedures: Your physician has requested that you have an echocardiogram. Echocardiography is a painless test that uses sound waves to create images of your heart. It provides your doctor with information about the size and shape of your heart and how well your heart's chambers and valves are working. This procedure takes approximately one hour. There are no restrictions for this procedure.  Your physician has recommended that you wear a holter monitor. Holter monitors are medical devices that record the heart's electrical activity. Doctors most often use these monitors to diagnose arrhythmias. Arrhythmias are problems with the speed or rhythm of the heartbeat. The monitor is a small, portable device. You can wear one while you do your normal daily activities. This is usually used to diagnose what is causing palpitations/syncope (passing out). Wear for 14 days.     Follow-Up: At [facility], you and your health needs are our priority.  As part of our continuing mission to provide you with exceptional heart care, we have created designated Provider Care Teams.  These Care Teams include your primary Cardiologist (physician) and Advanced Practice Providers (APPs -  Physician Assistants and Nurse Practitioners) who all work together to provide you with the care you need, when you need it.   Your next appointment:   3 month(s)  The format for your next appointment:   In Person  Provider:   Jenne Campus, MD  Other Instructions   Echocardiogram An echocardiogram is a procedure that uses painless sound waves (ultrasound) to produce an image of the heart. Images from an echocardiogram can provide important information about:  Signs of coronary artery disease (CAD).  Aneurysm detection. An aneurysm is a weak or damaged part of an artery wall that bulges out from the normal force of blood pumping through the body.  Heart size and shape. Changes in the size or shape of the heart can be associated with certain conditions, including heart failure, aneurysm, and CAD.  Heart muscle function.  Heart valve function.  Signs of a past heart attack.  Fluid buildup around the heart.  Thickening of the heart muscle.  A tumor or infectious growth around the heart valves. Tell a health care provider about:  Any allergies you have.  All medicines you are taking, including vitamins, herbs, eye drops, creams, and over-the-counter medicines.  Any blood disorders you have.  Any surgeries you have had.  Any medical conditions you have.  Whether you are pregnant or may be pregnant. What are the risks? Generally, this is a safe procedure. However, problems may occur, including:  Allergic reaction to dye (contrast) that may be used during the procedure. What happens before the procedure? No specific preparation is needed. You may eat and drink normally. What happens during the procedure?   An IV tube may be inserted into one of your veins.  You may receive contrast  through this tube. A contrast is an injection that improves the quality of the pictures from your heart.  A gel will be applied to your chest.  A wand-like tool (transducer) will be moved over your chest. The gel will help to transmit the sound waves from the transducer.  The sound waves will harmlessly  bounce off of your heart to allow the heart images to be captured in real-time motion. The images will be recorded on a computer. The procedure may vary among health care providers and hospitals. What happens after the procedure?  You may return to your normal, everyday life, including diet, activities, and medicines, unless your health care provider tells you not to do that. Summary  An echocardiogram is a procedure that uses painless sound waves (ultrasound) to produce an image of the heart.  Images from an echocardiogram can provide important information about the size and shape of your heart, heart muscle function, heart valve function, and fluid buildup around your heart.  You do not need to do anything to prepare before this procedure. You may eat and drink normally.  After the echocardiogram is completed, you may return to your normal, everyday life, unless your health care provider tells you not to do that. This information is not intended to replace advice given to you by your health care provider. Make sure you discuss any questions you have with your health care provider. Document Released: 01/25/2000 Document Revised: 05/20/2018 Document Reviewed: 03/01/2016 Elsevier Patient Education  2020 [employer].

## 2019-01-17 NOTE — Progress Notes (Signed)
Cardiology Office Note:    Date:  01/17/2019   ID:  Karen Shannon, DOB 06-Mar-1950, MRN [id number]  PCP:  Nicoletta Dress, MD  Cardiologist:  Jenne Campus, MD    Referring MD: Nicoletta Dress, MD   Chief Complaint  Patient presents with  . Follow-up  I had TIA  History of Present Illness:    Karen Shannon is a 68 y.o. female with past medical history significant for palpitations, extrasystole, history of mitral valve prolapse comes today to my office for follow-up.  Apparently in September she had episode of numbness in the left side of her face.  Eventually she ended up getting to a neurologist.  Questionable diagnosis of TIA has been made.  Apparently MRI has been done there with no problem and damage however TIA is considered.  Then few weeks later she had another episode somewhat similar.  She comes to my office to follow-up.  Denies having a palpitations, no chest pain, no tightness, no pressure no burning in the chest.  Obviously concern is about potentially having atrial fibrillation.  Past Medical History:  Diagnosis Date  . Family history of malignant neoplasm of breast   . Malignant neoplasm of breast (female), unspecified site   . Skin cancer    basal cell  . Varicose veins of left lower extremity     Past Surgical History:  Procedure Laterality Date  . MASTECTOMY Bilateral 2012    Current Medications: Current Meds  Medication Sig  . albuterol (PROVENTIL) (2.5 MG/3ML) 0.083% nebulizer solution Take 2.5 mg by nebulization every 6 (six) hours as needed for wheezing or shortness of breath.  . Ascorbic Acid (VITAMIN C) 1000 MG tablet Take 1,000 mg by mouth daily.  Marland Kitchen aspirin EC 81 MG tablet Take 81 mg by mouth daily.  Marland Kitchen atorvastatin (LIPITOR) 40 MG tablet Take 40 mg by mouth daily.   . calcium-vitamin D (OSCAL WITH D) 250-125 MG-UNIT tablet Take 1 tablet by mouth daily.  . Cholecalciferol (VITAMIN D3) 2000 units TABS Take 2,000 Int'l Units by mouth  1 day or 1 dose.  . ergocalciferol (VITAMIN D2) 50000 units capsule Take 50,000 Units by mouth once a week.  . Fluticasone-Salmeterol (ADVAIR) 250-50 MCG/DOSE AEPB Inhale 1 puff into the lungs 2 (two) times daily.  . furosemide (LASIX) 40 MG tablet TAKE 2 TABLETS DAILY AND 1 TABLET IN THE EVENING AS NEEDED  . meloxicam (MOBIC) 7.5 MG tablet Take by mouth.  . metoprolol succinate (TOPROL-XL) 25 MG 24 hr tablet Take 0.5 tablets (12.5 mg total) by mouth daily. Take with or immediately following a meal.  . montelukast (SINGULAIR) 10 MG tablet Take 1 tab daily  . ondansetron (ZOFRAN) 4 MG tablet Take 4 mg by mouth every 8 (eight) hours as needed for nausea or vomiting.  . potassium chloride SA (KLOR-CON) 20 MEQ tablet Take 20 mEq by mouth 2 (two) times daily.  . tamsulosin (FLOMAX) 0.4 MG CAPS capsule TAKE 2 (TWO) CAPSULE AT BEDTIME  . Tiotropium Bromide-Olodaterol (STIOLTO RESPIMAT) 2.5-2.5 MCG/ACT AERS Inhale 2 puffs into the lungs 1 day or 1 dose.  . topiramate (TOPAMAX) 50 MG tablet Take 50 mg by mouth 2 (two) times daily.  . vitamin B-12 (CYANOCOBALAMIN) 1000 MCG tablet Take 1,000 mcg by mouth daily.     Allergies:   Clarithromycin, Prednisone, Sertraline, and Sertraline hcl   Social History   Socioeconomic History  . Marital status: Single    Spouse name: Not on file  .  Number of children: 0  . Years of education: Not on file  . Highest education level: Bachelor's degree (e.g., BA, AB, BS)  Occupational History  . Not on file  Social Needs  . Financial resource strain: Not on file  . Food insecurity    Worry: Not on file    Inability: Not on file  . Transportation needs    Medical: Not on file    Non-medical: Not on file  Tobacco Use  . Smoking status: Never Smoker  . Smokeless tobacco: Never Used  Substance and Sexual Activity  . Alcohol use: Not Currently  . Drug use: Never  . Sexual activity: Not on file  Lifestyle  . Physical activity    Days per week: Not on file     Minutes per session: Not on file  . Stress: Not on file  Relationships  . Social Herbalist on phone: Not on file    Gets together: Not on file    Attends religious service: Not on file    Active member of club or organization: Not on file    Attends meetings of clubs or organizations: Not on file    Relationship status: Not on file  Other Topics Concern  . Not on file  Social History Narrative   Pt is single she lives alone has no children   Right handed     Family History: [name] history includes Breast cancer in her maternal aunt; Breast cancer (age of onset: 50) in an other family member; Emphysema in her father; Liver cancer in her maternal uncle; Lung cancer in her maternal uncle; Lymphoma (age of onset: 51) in her mother; Prostate cancer in an other family member; Stomach cancer in her paternal uncle. ROS:   Please see the history of present illness.    All 14 point review of systems negative except as described per history of present illness  EKGs/Labs/Other Studies Reviewed:      Recent Labs: No results found for requested labs within last 8760 hours.  Recent Lipid Panel No results found for: CHOL, TRIG, HDL, CHOLHDL, VLDL, LDLCALC, LDLDIRECT  Physical Exam:    VS:  BP 120/80   Pulse 79   Ht 5\' 7"  (1.702 m)   Wt 189 lb 6.4 oz (85.9 kg)   SpO2 97%   BMI 29.66 kg/m     Wt Readings from Last 3 Encounters:  01/17/19 189 lb 6.4 oz (85.9 kg)  12/21/18 188 lb (85.3 kg)  11/11/18 192 lb (87.1 kg)     GEN:  Well nourished, well developed in no acute distress HEENT: Normal NECK: No JVD; No carotid bruits LYMPHATICS: No lymphadenopathy CARDIAC: RRR, no murmurs, no rubs, no gallops RESPIRATORY:  Clear to auscultation without rales, wheezing or rhonchi  ABDOMEN: Soft, non-tender, non-distended MUSCULOSKELETAL:  No edema; No deformity  SKIN: Warm and dry LOWER EXTREMITIES: no swelling NEUROLOGIC:  Alert and oriented x 3 PSYCHIATRIC:   Normal affect   ASSESSMENT:    1. Dyspnea on exertion   2. Palpitations   3. Mitral valve prolapse    PLAN:    In order of problems listed above:  1. Dyspnea on exertion.  Denies having any. 2. Palpitations denies having any palpitation right now but now with the issue of TIA we concerned about potentially having atrial fibrillation.  I will ask you to wear monitor for few weeks.  I will also schedule her to have echocardiogram to assess left ventricle  ejection fraction more importantly look of the left atrial size.  She is given aspirin so far but I do not have indication for anticoagulation.  She is being followed by neurology.  There was also some issue of potentially having temporal arteritis and some biopsy is being scheduled. 3. History of mitral valve prolapse.  Echocardiogram will be done to assess the lesion.  Overall previously it was not concerned about it.  Now and in view of her TIAs again will be looking for atrial fibrillation.  She did have carotic ultrasound done already which showed no significant stenosis.   Medication Adjustments/Labs and Tests Ordered: Current medicines are reviewed at length with the patient today.  Concerns regarding medicines are outlined above.  No orders of the defined types were placed in this encounter.  Medication changes: No orders of the defined types were placed in this encounter.   Signed, Park Liter, MD, [facility] 01/17/2019 9:53 AM    [place]

## 2019-01-21 ENCOUNTER — Other Ambulatory Visit: Payer: Self-pay

## 2019-01-21 ENCOUNTER — Telehealth: Payer: Self-pay | Admitting: Neurology

## 2019-01-21 ENCOUNTER — Ambulatory Visit (INDEPENDENT_AMBULATORY_CARE_PROVIDER_SITE_OTHER): Payer: Medicare Other

## 2019-01-21 DIAGNOSIS — R0600 Dyspnea, unspecified: Secondary | ICD-10-CM

## 2019-01-21 DIAGNOSIS — R002 Palpitations: Secondary | ICD-10-CM

## 2019-01-21 NOTE — Telephone Encounter (Signed)
Attempted to reach patient, unable to leave message. 

## 2019-01-21 NOTE — Telephone Encounter (Signed)
Patient went to the cardiology appt and they asked about if Dr. Tomi Likens was going to still do the temporal artery biopsy- Thanks!

## 2019-01-21 NOTE — Telephone Encounter (Signed)
Mychart message sent....No.  I do not suspect temporal arteritis.  Sed rate was not significantly elevated and no evidence of ischemic vision loss noted on exam.

## 2019-01-21 NOTE — Progress Notes (Signed)
  Echocardiogram 2D Echocardiogram has been performed.  Darlina Sicilian M 01/21/2019

## 2019-01-21 NOTE — Telephone Encounter (Signed)
No.  I do not suspect temporal arteritis.  Sed rate was not significantly elevated and no evidence of ischemic vision loss noted on exam.

## 2019-01-22 ENCOUNTER — Other Ambulatory Visit: Payer: Self-pay | Admitting: Cardiology

## 2019-01-24 ENCOUNTER — Telehealth: Payer: Self-pay | Admitting: Neurology

## 2019-01-24 ENCOUNTER — Ambulatory Visit (INDEPENDENT_AMBULATORY_CARE_PROVIDER_SITE_OTHER): Payer: Medicare Other

## 2019-01-24 ENCOUNTER — Encounter

## 2019-01-24 ENCOUNTER — Encounter: Payer: Self-pay | Admitting: Psychology

## 2019-01-24 ENCOUNTER — Other Ambulatory Visit: Payer: Self-pay

## 2019-01-24 ENCOUNTER — Ambulatory Visit (INDEPENDENT_AMBULATORY_CARE_PROVIDER_SITE_OTHER): Payer: Medicare Other | Admitting: Psychology

## 2019-01-24 ENCOUNTER — Ambulatory Visit: Payer: Medicare Other | Admitting: Psychology

## 2019-01-24 DIAGNOSIS — G4733 Obstructive sleep apnea (adult) (pediatric): Secondary | ICD-10-CM

## 2019-01-24 DIAGNOSIS — R4189 Other symptoms and signs involving cognitive functions and awareness: Secondary | ICD-10-CM

## 2019-01-24 DIAGNOSIS — F411 Generalized anxiety disorder: Secondary | ICD-10-CM | POA: Insufficient documentation

## 2019-01-24 DIAGNOSIS — F330 Major depressive disorder, recurrent, mild: Secondary | ICD-10-CM

## 2019-01-24 DIAGNOSIS — R002 Palpitations: Secondary | ICD-10-CM | POA: Diagnosis not present

## 2019-01-24 DIAGNOSIS — R413 Other amnesia: Secondary | ICD-10-CM

## 2019-01-24 HISTORY — DX: Generalized anxiety disorder: F41.1

## 2019-01-24 NOTE — Progress Notes (Signed)
   Psychometrician Note   Hemlata Varnadore completed 95 minutes of neuropsychological testing with technician, Milana Kidney, B.S., under the supervision of Dr. Christia Reading, Ph.D., licensed psychologist. The patient did not appear overtly distressed by the testing session, per behavioral observation or via self-report to the technician. Rest breaks were offered.    In considering the patient's current level of functioning, level of presumed impairment, nature of symptoms, emotional and behavioral responses during the interview, level of literacy, and observed level of motivation/effort, a battery of tests was selected and communicated to the psychometrician.   Communication between the psychologist and technician was ongoing throughout the testing session and changes were made as deemed necessary based on patient performance on testing, technician observations and additional pertinent factors such as those listed above.   Karen Shannon will return within approximately two weeks for an interactive feedback session with Dr. Melvyn Novas at which time his test performances, clinical impressions, and treatment recommendations will be reviewed in detail. The patient understands she can contact our office should she require our assistance before this time.  95 minutes were spent face-to-face with patient administering standardized tests and 15 minutes were spent scoring (technician). [CPT [id number], [id number]  This note reflects time spent with the psychometrician and does not include test scores or any clinical interpretations made by Dr. Melvyn Novas. The full report will follow in a separate note.

## 2019-01-24 NOTE — Telephone Encounter (Signed)
Patient would like her results from her MRI/MRA faxed over to her Eye Doctor Dr. Marshall Cork. She said Dr. Tomi Likens had referred her to him. Thank you

## 2019-01-24 NOTE — Telephone Encounter (Signed)
Routed results via Epic to Dr. Kathlen Mody office
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
## 2019-02-01 ENCOUNTER — Ambulatory Visit: Payer: Medicare Other | Admitting: Neurology

## 2019-02-07 ENCOUNTER — Other Ambulatory Visit: Payer: Self-pay

## 2019-02-07 ENCOUNTER — Ambulatory Visit (INDEPENDENT_AMBULATORY_CARE_PROVIDER_SITE_OTHER): Payer: Medicare Other | Admitting: Psychology

## 2019-02-07 DIAGNOSIS — R4189 Other symptoms and signs involving cognitive functions and awareness: Secondary | ICD-10-CM

## 2019-02-07 DIAGNOSIS — F330 Major depressive disorder, recurrent, mild: Secondary | ICD-10-CM

## 2019-02-07 DIAGNOSIS — F411 Generalized anxiety disorder: Secondary | ICD-10-CM

## 2019-02-07 NOTE — Progress Notes (Signed)
   Neuropsychology Feedback Session [name]. [facility] Department of Neurology  Reason for Referral:   [name] a 68 y.o. Caucasian female referred by Metta Clines, D.O.,to characterize hercurrent cognitive functioning and assist with diagnostic clarity and treatment planning in the context of subjective cognitive dysfunction and a history of several cardiovascular ailments, potential prior TIAs, and migraine headaches.  Feedback:   Ms. Wonders completed a comprehensive neuropsychological evaluation on 01/24/2019. Please refer to that encounter for the full report and recommendations. Briefly, results suggested neuropsychological functioning largely within normal limits. Primary weaknesses were exhibited across phonemic fluency, as well as an isolated weakness across one unstructured executive functioning test assessing hypothesis testing and adaptability. Encoding (i.e., learning) aspects of memory could also represent a subtle relative weakness; however, scores remained within the below average to average normative ranges. Factors which can create and maintain cognitive inefficiencies include ongoing psychiatric distress ([name] endorsed mild symptoms of both anxiety and depression), history of migraine headaches, various cardiovascular ailments, ongoing sleep dysfunction, and variably treated sleep apnea.  [name] was unaccompanied during the current virtual feedback session. Content of the current session focused on the results of her neuropsychological evaluation. [name] was given the opportunity to ask questions and her questions were answered. she was also encouraged to reach out should additional questions arise. A copy of her report was mailed at the conclusion of the visit.      A total of 20 minutes were spent with [name] during the current feedback session.

## 2019-02-08 DIAGNOSIS — H5319 Other subjective visual disturbances: Secondary | ICD-10-CM | POA: Diagnosis not present

## 2019-02-08 DIAGNOSIS — H04122 Dry eye syndrome of left lacrimal gland: Secondary | ICD-10-CM | POA: Diagnosis not present

## 2019-02-08 DIAGNOSIS — H04123 Dry eye syndrome of bilateral lacrimal glands: Secondary | ICD-10-CM | POA: Diagnosis not present

## 2019-02-08 DIAGNOSIS — H02403 Unspecified ptosis of bilateral eyelids: Secondary | ICD-10-CM | POA: Diagnosis not present

## 2019-02-15 DIAGNOSIS — R002 Palpitations: Secondary | ICD-10-CM | POA: Diagnosis not present

## 2019-02-22 DIAGNOSIS — M2142 Flat foot [pes planus] (acquired), left foot: Secondary | ICD-10-CM | POA: Diagnosis not present

## 2019-02-22 DIAGNOSIS — M7989 Other specified soft tissue disorders: Secondary | ICD-10-CM | POA: Diagnosis not present

## 2019-02-22 DIAGNOSIS — M85872 Other specified disorders of bone density and structure, left ankle and foot: Secondary | ICD-10-CM | POA: Diagnosis not present

## 2019-02-22 DIAGNOSIS — M7732 Calcaneal spur, left foot: Secondary | ICD-10-CM | POA: Diagnosis not present

## 2019-02-23 DIAGNOSIS — M2142 Flat foot [pes planus] (acquired), left foot: Secondary | ICD-10-CM

## 2019-02-23 DIAGNOSIS — M7989 Other specified soft tissue disorders: Secondary | ICD-10-CM

## 2019-02-23 HISTORY — DX: Flat foot (pes planus) (acquired), left foot: M21.42

## 2019-02-23 HISTORY — DX: Other specified soft tissue disorders: M79.89

## 2019-02-28 ENCOUNTER — Ambulatory Visit (INDEPENDENT_AMBULATORY_CARE_PROVIDER_SITE_OTHER): Payer: Medicare Other | Admitting: Cardiology

## 2019-02-28 ENCOUNTER — Encounter: Payer: Self-pay | Admitting: Cardiology

## 2019-02-28 ENCOUNTER — Other Ambulatory Visit: Payer: Self-pay

## 2019-02-28 ENCOUNTER — Encounter: Payer: Medicare Other | Admitting: Psychology

## 2019-02-28 VITALS — BP 116/80 | HR 111 | Ht 66.0 in | Wt 184.0 lb

## 2019-02-28 DIAGNOSIS — I472 Ventricular tachycardia, unspecified: Secondary | ICD-10-CM | POA: Insufficient documentation

## 2019-02-28 DIAGNOSIS — R002 Palpitations: Secondary | ICD-10-CM | POA: Diagnosis not present

## 2019-02-28 DIAGNOSIS — I341 Nonrheumatic mitral (valve) prolapse: Secondary | ICD-10-CM

## 2019-02-28 DIAGNOSIS — G459 Transient cerebral ischemic attack, unspecified: Secondary | ICD-10-CM | POA: Diagnosis not present

## 2019-02-28 HISTORY — DX: Ventricular tachycardia, unspecified: I47.20

## 2019-02-28 HISTORY — DX: Ventricular tachycardia: I47.2

## 2019-02-28 NOTE — Patient Instructions (Addendum)
Medication Instructions:  Your physician recommends that you continue on your current medications as directed. Please refer to the Current Medication list given to you today.  *If you need a refill on your cardiac medications before your next appointment, please call your pharmacy*  Lab Work: Your physician recommends that you return for lab work in today: fasting lipids  If you have labs (blood work) drawn today and your tests are completely normal, you will receive your results only by: Marland Kitchen MyChart Message (if you have MyChart) OR . A paper copy in the mail If you have any lab test that is abnormal or we need to change your treatment, we will call you to review the results.  Testing/Procedures: Your physician has requested that you have a lexiscan myoview. For further information please visit [email or web address]. Please follow instruction sheet, as given.    Follow-Up: At [facility], you and your health needs are our priority.  As part of our continuing mission to provide you with exceptional heart care, we have created designated Provider Care Teams.  These Care Teams include your primary Cardiologist (physician) and Advanced Practice Providers (APPs -  Physician Assistants and Nurse Practitioners) who all work together to provide you with the care you need, when you need it.  Your next appointment:   3 month(s)  The format for your next appointment:   In Person  Provider:   Jenne Campus, MD  Other Instructions   Cardiac Nuclear Scan A cardiac nuclear scan is a test that measures blood flow to the heart when a person is resting and when he or she is exercising. The test looks for problems such as:  Not enough blood reaching a portion of the heart.  The heart muscle not working normally. You may need this test if:  You have heart disease.  You have had abnormal lab results.  You have had heart surgery or a balloon procedure to open up blocked arteries  (angioplasty).  You have chest pain.  You have shortness of breath. In this test, a radioactive dye (tracer) is injected into your bloodstream. After the tracer has traveled to your heart, an imaging device is used to measure how much of the tracer is absorbed by or distributed to various areas of your heart. This procedure is usually done at a hospital and takes 2-4 hours. Tell a health care provider about:  Any allergies you have.  All medicines you are taking, including vitamins, herbs, eye drops, creams, and over-the-counter medicines.  Any problems you or family members have had with anesthetic medicines.  Any blood disorders you have.  Any surgeries you have had.  Any medical conditions you have.  Whether you are pregnant or may be pregnant. What are the risks? Generally, this is a safe procedure. However, problems may occur, including:  Serious chest pain and heart attack. This is only a risk if the stress portion of the test is done.  Rapid heartbeat.  Sensation of warmth in your chest. This usually passes quickly.  Allergic reaction to the tracer. What happens before the procedure?  Ask your health care provider about changing or stopping your regular medicines. This is especially important if you are taking diabetes medicines or blood thinners.  Follow instructions from your health care provider about eating or drinking restrictions.  Remove your jewelry on the day of the procedure. What happens during the procedure?  An IV will be inserted into one of your veins.  Your health care provider will  inject a small amount of radioactive tracer through the IV.  You will wait for 20-40 minutes while the tracer travels through your bloodstream.  Your heart activity will be monitored with an electrocardiogram (ECG).  You will lie down on an exam table.  Images of your heart will be taken for about 15-20 minutes.  You may also have a stress test. For this test, one  of the following may be done: ? You will exercise on a treadmill or stationary bike. While you exercise, your heart's activity will be monitored with an ECG, and your blood pressure will be checked. ? You will be given medicines that will increase blood flow to parts of your heart. This is done if you are unable to exercise.  When blood flow to your heart has peaked, a tracer will again be injected through the IV.  After 20-40 minutes, you will get back on the exam table and have more images taken of your heart.  Depending on the type of tracer used, scans may need to be repeated 3-4 hours later.  Your IV line will be removed when the procedure is over. The procedure may vary among health care providers and hospitals. What happens after the procedure?  Unless your health care provider tells you otherwise, you may return to your normal schedule, including diet, activities, and medicines.  Unless your health care provider tells you otherwise, you may increase your fluid intake. This will help to flush the contrast dye from your body. Drink enough fluid to keep your urine pale yellow.  Ask your health care provider, or the department that is doing the test: ? When will my results be ready? ? How will I get my results? Summary  A cardiac nuclear scan measures the blood flow to the heart when a person is resting and when he or she is exercising.  Tell your health care provider if you are pregnant.  Before the procedure, ask your health care provider about changing or stopping your regular medicines. This is especially important if you are taking diabetes medicines or blood thinners.  After the procedure, unless your health care provider tells you otherwise, increase your fluid intake. This will help flush the contrast dye from your body.  After the procedure, unless your health care provider tells you otherwise, you may return to your normal schedule, including diet, activities, and  medicines. This information is not intended to replace advice given to you by your health care provider. Make sure you discuss any questions you have with your health care provider. Document Revised: 07/13/2017 Document Reviewed: 07/13/2017 Elsevier Patient Education  [place].

## 2019-02-28 NOTE — Progress Notes (Signed)
Cardiology Office Note:    Date:  02/28/2019   ID:  Karen Shannon, DOB [birth year], MRN [id number]  PCP:  Karen Dress, MD  Cardiologist:  Jenne Campus, MD    Referring MD: Karen Dress, MD   No chief complaint on file.   History of Present Illness:    Karen Shannon is a 69 y.o. female with past medical history significant for TIA.  We are trying to find out why she had this problem.  New problem appears to be cognitive impairment that she does have now.  She is being aggressively evaluated by the neurology team.  She comes actually with her neighbor so she can remember better what we are talking about.  She did have echocardiogram because of history of mitral valve prolapse.  No significant mitral regurgitation no evidence of mitral valve problem discovered, she also had monitor placed on to look for atrial fibrillation and she did have surprisingly nonsustained ventricular tachycardia.  She is completely asymptomatic.  Denies having any dizziness or passing out but obviously this finding need to be stratified.  And this is the reason why I see her in my office today.  She denies have any chest pain tightness squeezing pressure burning chest.  No dizziness no passing out.  Past Medical History:  Diagnosis Date  . Dyspnea on exertion 01/27/2018  . Family history of malignant neoplasm of breast   . Generalized anxiety disorder 01/24/2019   Possible history of panic attacks  . Heart murmur 01/27/2018  . Malignant neoplasm of breast (female), unspecified site   . Mitral valve prolapse 01/27/2018  . Obstructive sleep apnea    Variable CPAP use.  . Palpitations 01/25/2018  . Skin cancer    basal cell  . TIA (transient ischemic attack) 01/17/2019  . Varicose veins of left lower extremity     Past Surgical History:  Procedure Laterality Date  . MASTECTOMY Bilateral 2012    Current Medications: Current Meds  Medication Sig  . albuterol (PROVENTIL) (2.5  MG/3ML) 0.083% nebulizer solution Take 2.5 mg by nebulization every 6 (six) hours as needed for wheezing or shortness of breath.  . Ascorbic Acid (VITAMIN C) 1000 MG tablet Take 1,000 mg by mouth daily.  Marland Kitchen aspirin EC 81 MG tablet Take 81 mg by mouth daily.  Marland Kitchen atorvastatin (LIPITOR) 40 MG tablet Take 40 mg by mouth daily.   . calcium-vitamin D (OSCAL WITH D) 250-125 MG-UNIT tablet Take 1 tablet by mouth daily.  . Cholecalciferol (VITAMIN D3) 2000 units TABS Take 2,000 Int'l Units by mouth 1 day or 1 dose.  . ergocalciferol (VITAMIN D2) 50000 units capsule Take 50,000 Units by mouth once a week.  . Fluticasone-Salmeterol (ADVAIR) 250-50 MCG/DOSE AEPB Inhale 1 puff into the lungs 2 (two) times daily.  . furosemide (LASIX) 40 MG tablet TAKE 2 TABLETS DAILY AND 1 TABLET IN THE EVENING AS NEEDED  . meloxicam (MOBIC) 7.5 MG tablet Take by mouth.  . metoprolol succinate (TOPROL-XL) 25 MG 24 hr tablet Take 25 mg by mouth daily.  . montelukast (SINGULAIR) 10 MG tablet Take 1 tab daily  . ondansetron (ZOFRAN) 4 MG tablet Take 4 mg by mouth every 8 (eight) hours as needed for nausea or vomiting.  . potassium chloride SA (KLOR-CON) 20 MEQ tablet Take 20 mEq by mouth 2 (two) times daily.  . tamsulosin (FLOMAX) 0.4 MG CAPS capsule TAKE 2 (TWO) CAPSULE AT BEDTIME  . Tiotropium Bromide-Olodaterol (STIOLTO RESPIMAT) 2.5-2.5 MCG/ACT AERS Inhale  2 puffs into the lungs 1 day or 1 dose.  . topiramate (TOPAMAX) 50 MG tablet Take 50 mg by mouth 2 (two) times daily.  . vitamin B-12 (CYANOCOBALAMIN) 1000 MCG tablet Take 1,000 mcg by mouth daily.  . [DISCONTINUED] metoprolol succinate (TOPROL-XL) 25 MG 24 hr tablet TAKE 1/2 TABLET BY MOUTH DAILY. TAKE WITH OR IMMEDIATELY FOLLOWING A MEAL.     Allergies:   Clarithromycin, Prednisone, Sertraline, and Sertraline hcl   Social History   Socioeconomic History  . Marital status: Single    Spouse name: Not on file  . Number of children: 0  . Years of education: 16  .  Highest education level: Bachelor's degree (e.g., BA, AB, BS)  Occupational History  . Not on file  Tobacco Use  . Smoking status: Never Smoker  . Smokeless tobacco: Never Used  Substance and Sexual Activity  . Alcohol use: Not Currently  . Drug use: Never  . Sexual activity: Not on file  Other Topics Concern  . Not on file  Social History Narrative   Pt is single she lives alone has no children   Right handed   Social Determinants of Health   Financial Resource Strain:   . Difficulty of Paying Living Expenses: Not on file  Food Insecurity:   . Worried About Charity fundraiser in the Last Year: Not on file  . Ran Out of Food in the Last Year: Not on file  Transportation Needs:   . Lack of Transportation (Medical): Not on file  . Lack of Transportation (Non-Medical): Not on file  Physical Activity:   . Days of Exercise per Week: Not on file  . Minutes of Exercise per Session: Not on file  Stress:   . Feeling of Stress : Not on file  Social Connections:   . Frequency of Communication with Friends and Family: Not on file  . Frequency of Social Gatherings with Friends and Family: Not on file  . Attends Religious Services: Not on file  . Active Member of Clubs or Organizations: Not on file  . Attends Archivist Meetings: Not on file  . Marital Status: Not on file     Family History: [name] history includes Breast cancer in her maternal aunt; Breast cancer (age of onset: 14) in an other family member; Emphysema in her father; Liver cancer in her maternal uncle; Lung cancer in her maternal uncle; Lymphoma (age of onset: 5) in her mother; Prostate cancer in an other family member; Stomach cancer in her paternal uncle. ROS:   Please see the history of present illness.    All 14 point review of systems negative except as described per history of present illness  EKGs/Labs/Other Studies Reviewed:      Recent Labs: No results found for requested labs  within last 8760 hours.  Recent Lipid Panel No results found for: CHOL, TRIG, HDL, CHOLHDL, VLDL, LDLCALC, LDLDIRECT  Physical Exam:    VS:  BP 116/80 (BP Location: Right Arm, Cuff Size: Large)   Pulse (!) 111   Ht 5\' 6"  (1.676 m)   Wt 184 lb (83.5 kg)   SpO2 98%   BMI 29.70 kg/m     Wt Readings from Last 3 Encounters:  02/28/19 184 lb (83.5 kg)  01/17/19 189 lb 6.4 oz (85.9 kg)  12/21/18 188 lb (85.3 kg)     GEN:  Well nourished, well developed in no acute distress HEENT: Normal NECK: No JVD; No carotid bruits  LYMPHATICS: No lymphadenopathy CARDIAC: RRR, no murmurs, no rubs, no gallops RESPIRATORY:  Clear to auscultation without rales, wheezing or rhonchi  ABDOMEN: Soft, non-tender, non-distended MUSCULOSKELETAL:  No edema; No deformity  SKIN: Warm and dry LOWER EXTREMITIES: no swelling NEUROLOGIC:  Alert and oriented x 3 PSYCHIATRIC:  Normal affect   ASSESSMENT:    1. Mitral valve prolapse   2. TIA (transient ischemic attack)   3. Palpitations   4. Ventricular tachycardia ([place])    PLAN:    In order of problems listed above:  1. Mitral valve prolapse.  None noted on the echocardiogram. 2. History of TIA.  No recent issues.  On appropriate medication which I will continue that include aspirin.  I do not see her on statin.  We will check her fasting lipid profile. 3. Palpitations.  Denies having any lately 4. Ventricular tachycardia: We will schedule her to have a stress test as a risk stratification for her ventricular tachycardia.   Medication Adjustments/Labs and Tests Ordered: Current medicines are reviewed at length with the patient today.  Concerns regarding medicines are outlined above.  No orders of the defined types were placed in this encounter.  Medication changes: No orders of the defined types were placed in this encounter.   Signed, Park Liter, MD, [facility] 02/28/2019 2:28 PM    [place]

## 2019-03-01 LAB — LIPID PANEL
Chol/HDL Ratio: 3.1 ratio (ref 0.0–4.4)
Cholesterol, Total: 174 mg/dL (ref 100–199)
HDL: 57 mg/dL (ref >39)
LDL Chol Calc (NIH): 102 mg/dL — ABNORMAL HIGH (ref 0–99)
Triglycerides: 83 mg/dL (ref 0–149)
VLDL Cholesterol Cal: 15 mg/dL (ref 5–40)

## 2019-03-04 DIAGNOSIS — Z20828 Contact with and (suspected) exposure to other viral communicable diseases: Secondary | ICD-10-CM | POA: Diagnosis not present

## 2019-03-08 DIAGNOSIS — H02831 Dermatochalasis of right upper eyelid: Secondary | ICD-10-CM | POA: Diagnosis not present

## 2019-03-08 DIAGNOSIS — H02834 Dermatochalasis of left upper eyelid: Secondary | ICD-10-CM | POA: Diagnosis not present

## 2019-03-08 DIAGNOSIS — H02403 Unspecified ptosis of bilateral eyelids: Secondary | ICD-10-CM | POA: Diagnosis not present

## 2019-03-09 ENCOUNTER — Encounter: Payer: Medicare Other | Admitting: Psychology

## 2019-03-14 DIAGNOSIS — M7989 Other specified soft tissue disorders: Secondary | ICD-10-CM | POA: Diagnosis not present

## 2019-03-14 DIAGNOSIS — M79672 Pain in left foot: Secondary | ICD-10-CM | POA: Diagnosis not present

## 2019-03-14 DIAGNOSIS — G8929 Other chronic pain: Secondary | ICD-10-CM

## 2019-03-14 DIAGNOSIS — M79662 Pain in left lower leg: Secondary | ICD-10-CM | POA: Insufficient documentation

## 2019-03-14 DIAGNOSIS — I824Y2 Acute embolism and thrombosis of unspecified deep veins of left proximal lower extremity: Secondary | ICD-10-CM | POA: Diagnosis not present

## 2019-03-14 DIAGNOSIS — I82452 Acute embolism and thrombosis of left peroneal vein: Secondary | ICD-10-CM | POA: Diagnosis not present

## 2019-03-14 HISTORY — DX: Pain in left lower leg: M79.662

## 2019-03-14 HISTORY — DX: Other chronic pain: G89.29

## 2019-03-15 ENCOUNTER — Telehealth: Payer: Self-pay | Admitting: Cardiology

## 2019-03-15 DIAGNOSIS — I824Z2 Acute embolism and thrombosis of unspecified deep veins of left distal lower extremity: Secondary | ICD-10-CM | POA: Diagnosis not present

## 2019-03-15 NOTE — Telephone Encounter (Signed)
Dr. Gean Quint of [facility] states that he would like to discuss left leg DVT of the patient with Dr. Agustin Cree. Please return call at [phone].

## 2019-03-17 ENCOUNTER — Other Ambulatory Visit: Payer: Self-pay | Admitting: *Deleted

## 2019-03-17 ENCOUNTER — Telehealth: Payer: Self-pay | Admitting: Cardiology

## 2019-03-17 MED ORDER — METOPROLOL SUCCINATE ER 25 MG PO TB24: 25.0000 mg | ORAL_TABLET | Freq: Every day | ORAL | 1 refills | Status: DC

## 2019-03-17 NOTE — Telephone Encounter (Signed)
Pt c/o medication issue:  1. Name of Medication: Eliquis 5 MG  2. How are you currently taking this medication (dosage and times per day)? Take 2 tablets twice a day for a week, then take 1 tablet twice daily for 3 months.  3. Are you having a reaction (difficulty breathing--STAT)? No  4. What is your medication issue? Patient is calling stating she has been put on Eliquis and wanted to clarify with Dr. Agustin Cree it is okay for her to take it along with all her other medications.

## 2019-03-17 NOTE — Telephone Encounter (Signed)
Refill sent to [id number] 

## 2019-03-17 NOTE — Telephone Encounter (Signed)
*  STAT* If patient is at the pharmacy, call can be transferred to refill team.   1. Which medications need to be refilled? (please list name of each medication and dose if known)  metoprolol succinate (TOPROL-XL) 25 MG 24 hr tablet  2. Which pharmacy/location (including street and city if local pharmacy) is medication to be sent to? [id number] [place], [place] [address]  3. Do they need a 30 day or 90 day supply? 90 day supply  Patient only has 4 tablets left.

## 2019-03-18 NOTE — Telephone Encounter (Signed)
It is fine continue the rest of the medications

## 2019-03-22 NOTE — Telephone Encounter (Signed)
Called patient informed her we can go ahead do the stress test or wait two more weeks. She will go ahead with scheduled stress test.

## 2019-03-22 NOTE — Telephone Encounter (Signed)
Called patient informed her per Dr. Agustin Cree she can continue her medications with eliquis. During the call she explained to me why she got put on eliquis she was seeing orthopedic doctor and they discovered she had a blood clot in her leg. She has only be one it for 1 week and has a nuclear stress test scheduled in 2 weeks. Se wants to know if she should still do the stress test due to the recent blood clot in her leg. Will consult with Dr. Agustin Cree.

## 2019-03-22 NOTE — Telephone Encounter (Signed)
Reasonable to do the stress test if she wants we can postpone for another 2 weeks

## 2019-03-29 ENCOUNTER — Telehealth: Payer: Self-pay | Admitting: *Deleted

## 2019-03-29 ENCOUNTER — Encounter: Payer: Self-pay | Admitting: *Deleted

## 2019-03-29 NOTE — Telephone Encounter (Signed)
Patient given detailed instructions per Myocardial Perfusion Study Information Sheet for the test on 04/05/2019 at 0745. Patient notified to arrive 15 minutes early and that it is imperative to arrive on time for appointment to keep from having the test rescheduled.  If you need to cancel or reschedule your appointment, please call the office within 24 hours of your appointment. . Patient verbalized understanding.[place] Mychart letter sent with instructions

## 2019-04-02 DIAGNOSIS — E559 Vitamin D deficiency, unspecified: Secondary | ICD-10-CM | POA: Diagnosis not present

## 2019-04-02 DIAGNOSIS — E785 Hyperlipidemia, unspecified: Secondary | ICD-10-CM | POA: Diagnosis not present

## 2019-04-02 DIAGNOSIS — Z79899 Other long term (current) drug therapy: Secondary | ICD-10-CM | POA: Diagnosis not present

## 2019-04-02 DIAGNOSIS — E0590 Thyrotoxicosis, unspecified without thyrotoxic crisis or storm: Secondary | ICD-10-CM | POA: Diagnosis not present

## 2019-04-02 DIAGNOSIS — I824Z2 Acute embolism and thrombosis of unspecified deep veins of left distal lower extremity: Secondary | ICD-10-CM | POA: Diagnosis not present

## 2019-04-02 DIAGNOSIS — Z139 Encounter for screening, unspecified: Secondary | ICD-10-CM | POA: Diagnosis not present

## 2019-04-04 DIAGNOSIS — M19072 Primary osteoarthritis, left ankle and foot: Secondary | ICD-10-CM | POA: Diagnosis not present

## 2019-04-04 DIAGNOSIS — M79672 Pain in left foot: Secondary | ICD-10-CM | POA: Diagnosis not present

## 2019-04-04 DIAGNOSIS — G8929 Other chronic pain: Secondary | ICD-10-CM | POA: Diagnosis not present

## 2019-04-05 ENCOUNTER — Other Ambulatory Visit: Payer: Self-pay

## 2019-04-05 ENCOUNTER — Ambulatory Visit (INDEPENDENT_AMBULATORY_CARE_PROVIDER_SITE_OTHER): Payer: Medicare Other

## 2019-04-05 DIAGNOSIS — I341 Nonrheumatic mitral (valve) prolapse: Secondary | ICD-10-CM

## 2019-04-05 DIAGNOSIS — G459 Transient cerebral ischemic attack, unspecified: Secondary | ICD-10-CM

## 2019-04-05 DIAGNOSIS — I472 Ventricular tachycardia: Secondary | ICD-10-CM | POA: Diagnosis not present

## 2019-04-05 DIAGNOSIS — R002 Palpitations: Secondary | ICD-10-CM

## 2019-04-05 LAB — MYOCARDIAL PERFUSION IMAGING
LV dias vol: 84 mL (ref 46–106)
LV sys vol: 29 mL
Peak HR: 92 {beats}/min
Rest HR: 67 {beats}/min
SDS: 5
SRS: 8
SSS: 13
TID: 0.96

## 2019-04-05 MED ORDER — TECHNETIUM TC 99M TETROFOSMIN IV KIT
30.1000 | PACK | Freq: Once | INTRAVENOUS | Status: AC | PRN
  Administered 2019-04-05: 30.1 via INTRAVENOUS

## 2019-04-05 MED ORDER — TECHNETIUM TC 99M TETROFOSMIN IV KIT
10.0000 | PACK | Freq: Once | INTRAVENOUS | Status: AC | PRN
  Administered 2019-04-05: 10 via INTRAVENOUS

## 2019-04-05 MED ORDER — REGADENOSON 0.4 MG/5ML IV SOLN
0.4000 mg | Freq: Once | INTRAVENOUS | Status: AC
  Administered 2019-04-05: 0.4 mg via INTRAVENOUS

## 2019-04-07 DIAGNOSIS — Z23 Encounter for immunization: Secondary | ICD-10-CM | POA: Diagnosis not present

## 2019-04-11 DIAGNOSIS — I82452 Acute embolism and thrombosis of left peroneal vein: Secondary | ICD-10-CM

## 2019-04-11 DIAGNOSIS — I82412 Acute embolism and thrombosis of left femoral vein: Secondary | ICD-10-CM | POA: Insufficient documentation

## 2019-04-11 DIAGNOSIS — M659 Synovitis and tenosynovitis, unspecified: Secondary | ICD-10-CM

## 2019-04-11 DIAGNOSIS — M65972 Unspecified synovitis and tenosynovitis, left ankle and foot: Secondary | ICD-10-CM

## 2019-04-11 DIAGNOSIS — M19072 Primary osteoarthritis, left ankle and foot: Secondary | ICD-10-CM | POA: Diagnosis not present

## 2019-04-11 HISTORY — DX: Acute embolism and thrombosis of left peroneal vein: I82.452

## 2019-04-11 HISTORY — DX: Acute embolism and thrombosis of left femoral vein: I82.412

## 2019-04-11 HISTORY — DX: Synovitis and tenosynovitis, unspecified: M65.9

## 2019-04-11 HISTORY — DX: Unspecified synovitis and tenosynovitis, left ankle and foot: M65.972

## 2019-04-14 DIAGNOSIS — M7062 Trochanteric bursitis, left hip: Secondary | ICD-10-CM | POA: Diagnosis not present

## 2019-04-14 DIAGNOSIS — M7061 Trochanteric bursitis, right hip: Secondary | ICD-10-CM | POA: Diagnosis not present

## 2019-04-18 ENCOUNTER — Ambulatory Visit: Payer: Medicare Other | Admitting: Cardiology

## 2019-04-19 NOTE — Progress Notes (Signed)
Virtual Visit via Video Note The purpose of this virtual visit is to provide medical care while limiting exposure to the novel coronavirus.    Consent was obtained for video visit:  Yes.   Answered questions that patient had about telehealth interaction:  Yes.   I discussed the limitations, risks, security and privacy concerns of performing an evaluation and management service by telemedicine. I also discussed with the patient that there may be a patient responsible charge related to this service. The patient expressed understanding and agreed to proceed.  Pt location: Home Physician Location: office Name of referring provider:  Nicoletta Dress, MD I connected with Karen Shannon at patients initiation/request on 04/20/2019 at 10:30 AM EST by video enabled telemedicine application and verified that I am speaking with the correct person using two identifiers. Pt MRN:  [id number] Pt DOB:  1950-05-31 Video Participants:  Karen Shannon   History of Present Illness:  Karen Shannon is a 69 year old female who follows up for migraine.  UPDATE: In November, she reported multiple symptomatology including memory deficits, blurred vision in left eye, and subjective left sided hearing loss.  MRI of brain w wo and MRA of head were normal. Echocardiogram from 01/21/2019 showed EF 55-60% with no cardiac source of embolus. Two week holter monitor from 12/12 to 02/05/2019 showed no a fib. She was subsequently found to have a DVT so ASA was switched to Eliquis in the meantime. She saw Dr. Marshall Cork at [facility] on 01/10/2019 was unremarkable.  Bilateral ptosis was noted.  She was referred for surgery but is on hold while on anticoagulation. Neuropsychological testing performed by Dr. Hazle Coca on 01/24/2019 was normal and her subjective memory complaints was felt to be secondary to depression and anxiety.  Current medications:  Toprol XL;  Mobic  HISTORY: She was diagnosed with "confusional migraines" in the 1970s, where she has severe migraine headache associated with brief confusion lasting 30 minutes. She typically has a couple a year. Initially, she was on nortriptyline for a year but stopped due to side effects.  On the night of 10/19/2018, she woke up with stinging paresthesias involving the left side of her face from forehead, over her left eye and down to the left side of her chin. She also noted moderate left frontotemporal throbbing pain as well as left otalgia, decreased hearing in left ear, blurred vision in left eye, nausea and noted that she was drooling from the corner of both sides of her mouth. She got up and felt dizzy and unsteady on her feet. No slurred speech, speech disturbance, or new unilateral facial droop or extremity weakness. No fevers. She was tested for COVID, which was negative. No specific triggers. She had an MRI of the brain without contrast on 10/29/2018 which was unremarkable. Her PCP thought she may have had a migraine, so she was prescribed topiramate. She stopped after one dose because it didn't make her feel well. Due to the left otalgia and trouble hearing, she saw ENT. Exam was negative. She had mild residual symptoms everyday up until for the next 2 weeks. During that time, she did have one of her typical migraines. She noted confusion off and on over those 3 weeks as well. Typical migraines not associated with left facial paresthesias, drooling and dizziness. Carotid doppler from 11/24/2018 showed only minimal heterogenous plaque with no hemodynamically significant stenosis. In November 2020, she was driving to the grocery store and then she started  having a "fuzzy feeling" on her forehead and she suddenly became confused and was disoriented.  She didn't know where she was.  She had to ask her passenger where to go.  It lasted about 5 minutes.  Since then, she has had residual symptoms.   She reported intermittent slurred speech.  She has short term memory problems.  She states this is different than her prior migraines with confusion.  She has left sided neck pain and ear and her orthopedist started her on Mobic which was ineffective.  Left eye is still blurred.  Sed rate was 24.  She does have chronic neck pain with cervical spondylosis, which is treated by orthopedics. MRI of cervical spine from 03/27/2018 showed degenerative cervical spondylosis with edematous arthritis at C1-2 on right and C3-4 on the left but no significant foraminal or spinal stenosis. She says it has gradually gotten worse over the past few months.  MRI of lumbar spine from same day showed scoliosis as well as multilevel degenerative changes including foraminal narrowing on left that may be affecting left L3 nerve root and right foraminal narrowing at L4-5 and L5-S1, possibly affecting right L5 nerve.  She has history of mitral valve prolapse with slight murmur and palpitations which is monitored by cardiology. Echocardiogram from 02/05/2018 was unremarkable with LV EF of 55-60% and grade 1 diastolic dysfunction. 48 hour Holter monitor in January 2020 reportedly revealed no significant arrhythmias. She was started on metoprolol around that time.   Past migraine preventatives:  Nortriptyline, topiramate  Past Medical History: Past Medical History:  Diagnosis Date  . Dyspnea on exertion 01/27/2018  . Family history of malignant neoplasm of breast   . Generalized anxiety disorder 01/24/2019   Possible history of panic attacks  . Heart murmur 01/27/2018  . Malignant neoplasm of breast (female), unspecified site   . Mitral valve prolapse 01/27/2018  . Obstructive sleep apnea    Variable CPAP use.  . Palpitations 01/25/2018  . Skin cancer    basal cell  . TIA (transient ischemic attack) 01/17/2019  . Varicose veins of left lower extremity     Medications: Outpatient Encounter Medications as of  04/20/2019  Medication Sig  . albuterol (PROVENTIL) (2.5 MG/3ML) 0.083% nebulizer solution Take 2.5 mg by nebulization every 6 (six) hours as needed for wheezing or shortness of breath.  . Ascorbic Acid (VITAMIN C) 1000 MG tablet Take 1,000 mg by mouth daily.  Marland Kitchen aspirin EC 81 MG tablet Take 81 mg by mouth daily.  Marland Kitchen atorvastatin (LIPITOR) 40 MG tablet Take 40 mg by mouth daily.   . calcium-vitamin D (OSCAL WITH D) 250-125 MG-UNIT tablet Take 1 tablet by mouth daily.  . Cholecalciferol (VITAMIN D3) 2000 units TABS Take 2,000 Int'l Units by mouth 1 day or 1 dose.  . ergocalciferol (VITAMIN D2) 50000 units capsule Take 50,000 Units by mouth once a week.  . Fluticasone-Salmeterol (ADVAIR) 250-50 MCG/DOSE AEPB Inhale 1 puff into the lungs 2 (two) times daily.  . furosemide (LASIX) 40 MG tablet TAKE 2 TABLETS DAILY AND 1 TABLET IN THE EVENING AS NEEDED  . meloxicam (MOBIC) 7.5 MG tablet Take by mouth.  . metoprolol succinate (TOPROL-XL) 25 MG 24 hr tablet Take 1 tablet (25 mg total) by mouth daily.  . montelukast (SINGULAIR) 10 MG tablet Take 1 tab daily  . ondansetron (ZOFRAN) 4 MG tablet Take 4 mg by mouth every 8 (eight) hours as needed for nausea or vomiting.  . potassium chloride SA (KLOR-CON) 20 MEQ  tablet Take 20 mEq by mouth 2 (two) times daily.  . tamsulosin (FLOMAX) 0.4 MG CAPS capsule TAKE 2 (TWO) CAPSULE AT BEDTIME  . Tiotropium Bromide-Olodaterol (STIOLTO RESPIMAT) 2.5-2.5 MCG/ACT AERS Inhale 2 puffs into the lungs 1 day or 1 dose.  . topiramate (TOPAMAX) 50 MG tablet Take 50 mg by mouth 2 (two) times daily.  . vitamin B-12 (CYANOCOBALAMIN) 1000 MCG tablet Take 1,000 mcg by mouth daily.   No facility-administered encounter medications on file as of 04/20/2019.    Allergies: Allergies  Allergen Reactions  . Clarithromycin Diarrhea and Other (See Comments)  . Prednisone Other (See Comments)  . Sertraline Diarrhea  . Sertraline Hcl Diarrhea    Family History: Family History    Problem Relation Age of Onset  . Lymphoma Mother 103       deceased 31  . Breast cancer Maternal Aunt        Dx 23s; Deceased 14s  . Lung cancer Maternal Uncle   . Stomach cancer Paternal Uncle        deceased 72  . Liver cancer Maternal Uncle   . Prostate cancer Other        3 mat cousins  . Breast cancer Other 61       mother's mat half-sister  . Emphysema Father     Social History: Social History   Socioeconomic History  . Marital status: Single    Spouse name: Not on file  . Number of children: 0  . Years of education: 16  . Highest education level: Bachelor's degree (e.g., BA, AB, BS)  Occupational History  . Not on file  Tobacco Use  . Smoking status: Never Smoker  . Smokeless tobacco: Never Used  Substance and Sexual Activity  . Alcohol use: Not Currently  . Drug use: Never  . Sexual activity: Not on file  Other Topics Concern  . Not on file  Social History Narrative   Pt is single she lives alone has no children   Right handed   Social Determinants of Health   Financial Resource Strain:   . Difficulty of Paying Living Expenses: Not on file  Food Insecurity:   . Worried About Charity fundraiser in the Last Year: Not on file  . Ran Out of Food in the Last Year: Not on file  Transportation Needs:   . Lack of Transportation (Medical): Not on file  . Lack of Transportation (Non-Medical): Not on file  Physical Activity:   . Days of Exercise per Week: Not on file  . Minutes of Exercise per Session: Not on file  Stress:   . Feeling of Stress : Not on file  Social Connections:   . Frequency of Communication with Friends and Family: Not on file  . Frequency of Social Gatherings with Friends and Family: Not on file  . Attends Religious Services: Not on file  . Active Member of Clubs or Organizations: Not on file  . Attends Archivist Meetings: Not on file  . Marital Status: Not on file  Intimate Partner Violence:   . Fear of Current or  Ex-Partner: Not on file  . Emotionally Abused: Not on file  . Physically Abused: Not on file  . Sexually Abused: Not on file    Observations/Objective:   Height 5\' 6"  (1.676 m), weight 184 lb (83.5 kg). No acute distress.  Alert and oriented.  Speech fluent and not dysarthric.  Language intact.  Eyes orthophoric on primary gaze.  Face  symmetric.  Assessment and Plan:   1.  Subjective visual complaints (decreased acuity in left eye).  Ophthalmologic exam unremarkable. 2.  Subjective memory complaints.  Neuropsychological exam within normal limits. 3.  Subjective left sided hearing loss.  ENT evaluation unremarkable. 4.  Left sided neck and ear pain, likely related to cervical spondylosis. 5.  Neurologic event in September 2020. Differential diagnosis includes cerebrovascular event associated with migraine or prolonged migraine with aura.  Atypical presentation for both, as symptoms persisted for 3 weeks.  As she had some focal associated symptoms never before associated with her migraines, I would favor treating for secondary stroke prevention.   1.  On Eliquis for DVT  2.  Statin therapy (LDL goal less than 70) 3.  Blood pressure control 4.  Consider repeat neuropsych testing in year 5.  Management of anxiety 6.  Continue use of CPAP 7.  Follow up in 6 months  Follow Up Instructions:    -I discussed the assessment and treatment plan with the patient. The patient was provided an opportunity to ask questions and all were answered. The patient agreed with the plan and demonstrated an understanding of the instructions.   The patient was advised to call back or seek an in-person evaluation if the symptoms worsen or if the condition fails to improve as anticipated.    Dudley Major, DO

## 2019-04-20 ENCOUNTER — Encounter: Payer: Self-pay | Admitting: Neurology

## 2019-04-20 ENCOUNTER — Other Ambulatory Visit: Payer: Self-pay

## 2019-04-20 ENCOUNTER — Telehealth (INDEPENDENT_AMBULATORY_CARE_PROVIDER_SITE_OTHER): Payer: Medicare Other | Admitting: Neurology

## 2019-04-20 VITALS — Ht 66.0 in | Wt 184.0 lb

## 2019-04-20 DIAGNOSIS — G43109 Migraine with aura, not intractable, without status migrainosus: Secondary | ICD-10-CM

## 2019-04-20 DIAGNOSIS — G4733 Obstructive sleep apnea (adult) (pediatric): Secondary | ICD-10-CM | POA: Diagnosis not present

## 2019-04-20 DIAGNOSIS — G459 Transient cerebral ischemic attack, unspecified: Secondary | ICD-10-CM | POA: Diagnosis not present

## 2019-04-20 DIAGNOSIS — R4189 Other symptoms and signs involving cognitive functions and awareness: Secondary | ICD-10-CM

## 2019-04-25 DIAGNOSIS — M659 Synovitis and tenosynovitis, unspecified: Secondary | ICD-10-CM | POA: Diagnosis not present

## 2019-04-25 DIAGNOSIS — I82412 Acute embolism and thrombosis of left femoral vein: Secondary | ICD-10-CM | POA: Diagnosis not present

## 2019-04-25 DIAGNOSIS — G8929 Other chronic pain: Secondary | ICD-10-CM | POA: Diagnosis not present

## 2019-04-25 DIAGNOSIS — M79672 Pain in left foot: Secondary | ICD-10-CM | POA: Diagnosis not present

## 2019-05-05 DIAGNOSIS — Z23 Encounter for immunization: Secondary | ICD-10-CM | POA: Diagnosis not present

## 2019-05-16 DIAGNOSIS — R109 Unspecified abdominal pain: Secondary | ICD-10-CM | POA: Diagnosis not present

## 2019-05-16 DIAGNOSIS — S2231XA Fracture of one rib, right side, initial encounter for closed fracture: Secondary | ICD-10-CM | POA: Diagnosis not present

## 2019-05-16 DIAGNOSIS — N390 Urinary tract infection, site not specified: Secondary | ICD-10-CM | POA: Diagnosis not present

## 2019-05-16 DIAGNOSIS — I824Z2 Acute embolism and thrombosis of unspecified deep veins of left distal lower extremity: Secondary | ICD-10-CM | POA: Diagnosis not present

## 2019-05-26 ENCOUNTER — Other Ambulatory Visit: Payer: Self-pay

## 2019-05-27 ENCOUNTER — Other Ambulatory Visit: Payer: Self-pay

## 2019-05-27 ENCOUNTER — Encounter: Payer: Self-pay | Admitting: Cardiology

## 2019-05-27 ENCOUNTER — Ambulatory Visit (INDEPENDENT_AMBULATORY_CARE_PROVIDER_SITE_OTHER): Payer: Medicare Other | Admitting: Cardiology

## 2019-05-27 VITALS — BP 128/78 | Temp 97.3°F | Ht 66.0 in | Wt 185.6 lb

## 2019-05-27 DIAGNOSIS — M7989 Other specified soft tissue disorders: Secondary | ICD-10-CM | POA: Diagnosis not present

## 2019-05-27 DIAGNOSIS — I341 Nonrheumatic mitral (valve) prolapse: Secondary | ICD-10-CM | POA: Diagnosis not present

## 2019-05-27 DIAGNOSIS — R002 Palpitations: Secondary | ICD-10-CM | POA: Diagnosis not present

## 2019-05-27 DIAGNOSIS — R0609 Other forms of dyspnea: Secondary | ICD-10-CM

## 2019-05-27 DIAGNOSIS — R011 Cardiac murmur, unspecified: Secondary | ICD-10-CM | POA: Diagnosis not present

## 2019-05-27 DIAGNOSIS — I82412 Acute embolism and thrombosis of left femoral vein: Secondary | ICD-10-CM

## 2019-05-27 DIAGNOSIS — R0600 Dyspnea, unspecified: Secondary | ICD-10-CM | POA: Diagnosis not present

## 2019-05-27 NOTE — Progress Notes (Signed)
Cardiology Office Note:    Date:  05/27/2019   ID:  Karen Shannon, DOB [birth year], MRN [id number]  PCP:  Nicoletta Dress, MD  Cardiologist:  Jenne Campus, MD    Referring MD: Nicoletta Dress, MD   No chief complaint on file. Have pain right leg sometimes  History of Present Illness:    Karen Shannon is a 69 y.o. female with past medical history significant for mitral regurgitation, TIA, recently recognized DVT, nonsustained V. tach.  She comes today 2 months of follow-up.  She overall doing well but complain of having pain in her legs.  Pain happen especially when she tried to walk but also have pain in her legs when she stands up she described pain being in times as well as in her hips.  Symptoms seem that she has pain when she lays down at night.  She tells me however the pain is much worse with walking.  Denies having dizziness or passing out, no chest pain.  Past Medical History:  Diagnosis Date  . Acute deep vein thrombosis (DVT) of left peroneal vein ([place]) 04/11/2019  . Cervical spondylosis 03/18/2018  . Chronic bilateral low back pain without sciatica 03/18/2018  . Chronic foot pain, left 03/14/2019   Formatting of this note might be different from the original. Possible fracture base 2nd metatarsal  . Chronic neck pain 03/18/2018  . DDD (degenerative disc disease), cervical 03/18/2018  . DVT femoral (deep venous thrombosis) with thrombophlebitis, left ([place]) 04/11/2019  . Dyspnea on exertion 01/27/2018  . Facet arthritis, degenerative, lumbar spine 03/18/2018  . Family history of malignant neoplasm of breast   . Generalized anxiety disorder 01/24/2019   Possible history of panic attacks  . Greater trochanteric bursitis of both hips 04/10/2018  . Heart murmur 01/27/2018  . History of breast cancer 03/18/2018   S/p bilateral mastectomy  Formatting of this note might be different from the original. S/p bilateral mastectomy  . Left leg swelling 02/23/2019  . Lumbar  spondylosis 03/18/2018  . Malignant neoplasm of breast (female), unspecified site   . Mitral valve prolapse 01/27/2018  . Obstructive sleep apnea    Variable CPAP use.  . Other idiopathic scoliosis, lumbar region 03/18/2018  . Pain of left calf 03/14/2019  . Palpitations 01/25/2018  . Pes planus of left foot 02/23/2019  . Skin cancer    basal cell  . Tenosynovitis of left ankle 04/11/2019  . TIA (transient ischemic attack) 01/17/2019  . Varicose veins of left lower extremity   . Ventricular tachycardia ([place]) 02/28/2019    Past Surgical History:  Procedure Laterality Date  . MASTECTOMY Bilateral 2012    Current Medications: Current Meds  Medication Sig  . albuterol (PROVENTIL) (2.5 MG/3ML) 0.083% nebulizer solution Take 2.5 mg by nebulization every 6 (six) hours as needed for wheezing or shortness of breath.  Marland Kitchen apixaban (ELIQUIS) 5 MG TABS tablet   . Ascorbic Acid (VITAMIN C) 1000 MG tablet Take 1,000 mg by mouth daily.  Marland Kitchen atorvastatin (LIPITOR) 40 MG tablet Take 40 mg by mouth daily.   . calcium carbonate (OSCAL) 1500 (600 Ca) MG TABS tablet Take 1 tablet by mouth daily.  . calcium-vitamin D (OSCAL WITH D) 250-125 MG-UNIT tablet Take 1 tablet by mouth daily.  . Cholecalciferol (VITAMIN D3) 2000 units TABS Take 2,000 Int'l Units by mouth 1 day or 1 dose.  . dicyclomine (BENTYL) 20 MG tablet Take 20 mg by mouth 4 (four) times daily as needed.  Marland Kitchen  ergocalciferol (VITAMIN D2) 50000 units capsule Take 50,000 Units by mouth once a week.  . Fluticasone-Salmeterol (ADVAIR) 250-50 MCG/DOSE AEPB Inhale 1 puff into the lungs 2 (two) times daily.  . furosemide (LASIX) 40 MG tablet TAKE 2 TABLETS DAILY AND 1 TABLET IN THE EVENING AS NEEDED  . meloxicam (MOBIC) 7.5 MG tablet Take 1 tablet by mouth daily.  . metoprolol succinate (TOPROL-XL) 25 MG 24 hr tablet Take 1 tablet (25 mg total) by mouth daily.  . montelukast (SINGULAIR) 10 MG tablet Take 1 tab daily  . ondansetron (ZOFRAN) 4 MG tablet Take 4 mg  by mouth every 8 (eight) hours as needed for nausea or vomiting.  . Potassium Chloride ER 20 MEQ TBCR Take 1 tablet by mouth 2 (two) times daily.  . tamsulosin (FLOMAX) 0.4 MG CAPS capsule TAKE 2 (TWO) CAPSULE AT BEDTIME  . Tiotropium Bromide-Olodaterol (STIOLTO RESPIMAT) 2.5-2.5 MCG/ACT AERS Inhale 2 puffs into the lungs 1 day or 1 dose.  . vitamin B-12 (CYANOCOBALAMIN) 1000 MCG tablet Take 1,000 mcg by mouth daily.     Allergies:   Clarithromycin, Prednisone, Sertraline, and Sertraline hcl   Social History   Socioeconomic History  . Marital status: Single    Spouse name: Not on file  . Number of children: 0  . Years of education: 16  . Highest education level: Bachelor's degree (e.g., BA, AB, BS)  Occupational History  . Not on file  Tobacco Use  . Smoking status: Never Smoker  . Smokeless tobacco: Never Used  Substance and Sexual Activity  . Alcohol use: Not Currently  . Drug use: Never  . Sexual activity: Not on file  Other Topics Concern  . Not on file  Social History Narrative   Pt is single she lives alone has no children   Right handed   Social Determinants of Health   Financial Resource Strain:   . Difficulty of Paying Living Expenses:   Food Insecurity:   . Worried About Charity fundraiser in the Last Year:   . Arboriculturist in the Last Year:   Transportation Needs:   . Film/video editor (Medical):   Marland Kitchen Lack of Transportation (Non-Medical):   Physical Activity:   . Days of Exercise per Week:   . Minutes of Exercise per Session:   Stress:   . Feeling of Stress :   Social Connections:   . Frequency of Communication with Friends and Family:   . Frequency of Social Gatherings with Friends and Family:   . Attends Religious Services:   . Active Member of Clubs or Organizations:   . Attends Archivist Meetings:   Marland Kitchen Marital Status:      Family History: [name] history includes Breast cancer in her maternal aunt; Breast cancer  (age of onset: 74) in an other family member; Emphysema in her father; Liver cancer in her maternal uncle; Lung cancer in her maternal uncle; Lymphoma (age of onset: 61) in her mother; Prostate cancer in an other family member; Stomach cancer in her paternal uncle. ROS:   Please see the history of present illness.    All 14 point review of systems negative except as described per history of present illness  EKGs/Labs/Other Studies Reviewed:      Recent Labs: No results found for requested labs within last 8760 hours.  Recent Lipid Panel    Component Value Date/Time   CHOL 174 02/28/2019 1447   TRIG 83 02/28/2019 1447  HDL 57 02/28/2019 1447   CHOLHDL 3.1 02/28/2019 1447   LDLCALC 102 (H) 02/28/2019 1447    Physical Exam:    VS:  BP 128/78   Temp (!) 97.3 F (36.3 C)   Ht 5\' 6"  (1.676 m)   Wt 185 lb 9.6 oz (84.2 kg)   SpO2 96%   BMI 29.96 kg/m     Wt Readings from Last 3 Encounters:  05/27/19 185 lb 9.6 oz (84.2 kg)  04/20/19 184 lb (83.5 kg)  04/05/19 184 lb (83.5 kg)     GEN:  Well nourished, well developed in no acute distress HEENT: Normal NECK: No JVD; No carotid bruits LYMPHATICS: No lymphadenopathy CARDIAC: RRR, no murmurs, no rubs, no gallops RESPIRATORY:  Clear to auscultation without rales, wheezing or rhonchi  ABDOMEN: Soft, non-tender, non-distended MUSCULOSKELETAL:  No edema; No deformity  SKIN: Warm and dry LOWER EXTREMITIES: no swelling NEUROLOGIC:  Alert and oriented x 3 PSYCHIATRIC:  Normal affect   ASSESSMENT:    1. Palpitations   2. Dyspnea on exertion   3. DVT femoral (deep venous thrombosis) with thrombophlebitis, left ([place])   4. Mitral valve prolapse   5. Heart murmur   6. Left leg swelling    PLAN:    In order of problems listed above:  1. Palpitations.  She did wear monitor which showed surprisingly nonsustained ventricular tachycardia.  As a part of stratification she had stress test which showed no evidence of ischemia, she  also had echocardiogram before which showed preserved left ventricle ejection fraction therefore, we will continue conservative approach. 2. Dyspnea on exertion, multifactorial.  Likely looks like there is no cardiac reason for her symptoms. 3. History of DVT.  She is anticoagulated with Eliquis which I will continue.  She is not on aspirin. 4. Heart murmur: Echocardiogram did not show significant valvular pathology. 5.  Leg pain while walking suspicion for claudication.  Ultrasounds of her lower extremities will be done to look out arteries.   Medication Adjustments/Labs and Tests Ordered: Current medicines are reviewed at length with the patient today.  Concerns regarding medicines are outlined above.  Orders Placed This Encounter  Procedures  . VAS [place] ABI WITH/WO TBI   Medication changes: No orders of the defined types were placed in this encounter.   Signed, Park Liter, MD, [facility] 05/27/2019 3:08 PM    [facility]

## 2019-05-27 NOTE — Patient Instructions (Addendum)
Medication Instructions:  None today *If you need a refill on your cardiac medications before your next appointment, please call your pharmacy*   Lab Work: None today If you have labs (blood work) drawn today and your tests are completely normal, you will receive your results only by: Marland Kitchen MyChart Message (if you have MyChart) OR . A paper copy in the mail If you have any lab test that is abnormal or we need to change your treatment, we will call you to review the results.   Testing/Procedures: None today   Follow-Up: At [facility], you and your health needs are our priority.  As part of our continuing mission to provide you with exceptional heart care, we have created designated Provider Care Teams.  These Care Teams include your primary Cardiologist (physician) and Advanced Practice Providers (APPs -  Physician Assistants and Nurse Practitioners) who all work together to provide you with the care you need, when you need it.  We recommend signing up for the patient portal called "MyChart".  Sign up information is provided on this After Visit Summary.  MyChart is used to connect with patients for Virtual Visits (Telemedicine).  Patients are able to view lab/test results, encounter notes, upcoming appointments, etc.  Non-urgent messages can be sent to your provider as well.   To learn more about what you can do with MyChart, go to [email or web address].    Your next appointment:   5 month(s)  The format for your next appointment:   In Person  Provider:   Jenne Campus, MD   Other Instructions Your next apt is Tuesday September 21 at 1:20 pm.

## 2019-06-11 DIAGNOSIS — G44319 Acute post-traumatic headache, not intractable: Secondary | ICD-10-CM | POA: Diagnosis not present

## 2019-06-21 ENCOUNTER — Ambulatory Visit (INDEPENDENT_AMBULATORY_CARE_PROVIDER_SITE_OTHER): Payer: Medicare Other

## 2019-06-21 ENCOUNTER — Other Ambulatory Visit: Payer: Self-pay

## 2019-06-21 DIAGNOSIS — M79604 Pain in right leg: Secondary | ICD-10-CM

## 2019-06-21 DIAGNOSIS — I82412 Acute embolism and thrombosis of left femoral vein: Secondary | ICD-10-CM

## 2019-06-21 DIAGNOSIS — R0609 Other forms of dyspnea: Secondary | ICD-10-CM

## 2019-06-21 DIAGNOSIS — M79605 Pain in left leg: Secondary | ICD-10-CM

## 2019-06-21 NOTE — Progress Notes (Signed)
ABI has been performed.  Jimmy Onika Gudiel RDCS, RVT 

## 2019-06-22 DIAGNOSIS — Z8673 Personal history of transient ischemic attack (TIA), and cerebral infarction without residual deficits: Secondary | ICD-10-CM | POA: Diagnosis not present

## 2019-06-22 DIAGNOSIS — Z853 Personal history of malignant neoplasm of breast: Secondary | ICD-10-CM | POA: Diagnosis not present

## 2019-06-27 DIAGNOSIS — I82442 Acute embolism and thrombosis of left tibial vein: Secondary | ICD-10-CM | POA: Diagnosis not present

## 2019-06-27 DIAGNOSIS — I82432 Acute embolism and thrombosis of left popliteal vein: Secondary | ICD-10-CM | POA: Diagnosis not present

## 2019-06-28 DIAGNOSIS — N23 Unspecified renal colic: Secondary | ICD-10-CM | POA: Diagnosis not present

## 2019-06-28 DIAGNOSIS — K5730 Diverticulosis of large intestine without perforation or abscess without bleeding: Secondary | ICD-10-CM | POA: Diagnosis not present

## 2019-06-28 DIAGNOSIS — R310 Gross hematuria: Secondary | ICD-10-CM | POA: Diagnosis not present

## 2019-07-01 DIAGNOSIS — S93602A Unspecified sprain of left foot, initial encounter: Secondary | ICD-10-CM | POA: Diagnosis not present

## 2019-07-02 DIAGNOSIS — Z79899 Other long term (current) drug therapy: Secondary | ICD-10-CM | POA: Diagnosis not present

## 2019-07-02 DIAGNOSIS — E785 Hyperlipidemia, unspecified: Secondary | ICD-10-CM | POA: Diagnosis not present

## 2019-07-02 DIAGNOSIS — E0590 Thyrotoxicosis, unspecified without thyrotoxic crisis or storm: Secondary | ICD-10-CM | POA: Diagnosis not present

## 2019-07-02 DIAGNOSIS — E559 Vitamin D deficiency, unspecified: Secondary | ICD-10-CM | POA: Diagnosis not present

## 2019-07-12 DIAGNOSIS — S99921A Unspecified injury of right foot, initial encounter: Secondary | ICD-10-CM | POA: Diagnosis not present

## 2019-07-12 DIAGNOSIS — S8991XA Unspecified injury of right lower leg, initial encounter: Secondary | ICD-10-CM | POA: Diagnosis not present

## 2019-07-12 DIAGNOSIS — M25571 Pain in right ankle and joints of right foot: Secondary | ICD-10-CM | POA: Diagnosis not present

## 2019-07-12 DIAGNOSIS — Y999 Unspecified external cause status: Secondary | ICD-10-CM | POA: Diagnosis not present

## 2019-07-12 DIAGNOSIS — W010XXA Fall on same level from slipping, tripping and stumbling without subsequent striking against object, initial encounter: Secondary | ICD-10-CM | POA: Diagnosis not present

## 2019-07-12 HISTORY — PX: SKIN CANCER EXCISION: SHX779

## 2019-07-14 DIAGNOSIS — S93621A Sprain of tarsometatarsal ligament of right foot, initial encounter: Secondary | ICD-10-CM

## 2019-07-14 DIAGNOSIS — M659 Synovitis and tenosynovitis, unspecified: Secondary | ICD-10-CM | POA: Diagnosis not present

## 2019-07-14 HISTORY — DX: Sprain of tarsometatarsal ligament of right foot, initial encounter: S93.621A

## 2019-07-26 DIAGNOSIS — S93621D Sprain of tarsometatarsal ligament of right foot, subsequent encounter: Secondary | ICD-10-CM | POA: Diagnosis not present

## 2019-07-28 DIAGNOSIS — R310 Gross hematuria: Secondary | ICD-10-CM | POA: Diagnosis not present

## 2019-07-28 DIAGNOSIS — R399 Unspecified symptoms and signs involving the genitourinary system: Secondary | ICD-10-CM | POA: Diagnosis not present

## 2019-07-28 DIAGNOSIS — N3941 Urge incontinence: Secondary | ICD-10-CM

## 2019-07-28 HISTORY — DX: Urge incontinence: N39.41

## 2019-08-17 DIAGNOSIS — N959 Unspecified menopausal and perimenopausal disorder: Secondary | ICD-10-CM | POA: Diagnosis not present

## 2019-08-23 DIAGNOSIS — R399 Unspecified symptoms and signs involving the genitourinary system: Secondary | ICD-10-CM | POA: Diagnosis not present

## 2019-08-23 DIAGNOSIS — N390 Urinary tract infection, site not specified: Secondary | ICD-10-CM | POA: Diagnosis not present

## 2019-08-23 DIAGNOSIS — R310 Gross hematuria: Secondary | ICD-10-CM | POA: Diagnosis not present

## 2019-09-01 ENCOUNTER — Other Ambulatory Visit: Payer: Self-pay | Admitting: Cardiology

## 2019-09-02 DIAGNOSIS — R300 Dysuria: Secondary | ICD-10-CM | POA: Diagnosis not present

## 2019-09-09 DIAGNOSIS — H2513 Age-related nuclear cataract, bilateral: Secondary | ICD-10-CM | POA: Diagnosis not present

## 2019-09-09 DIAGNOSIS — H25013 Cortical age-related cataract, bilateral: Secondary | ICD-10-CM | POA: Diagnosis not present

## 2019-09-09 DIAGNOSIS — H40013 Open angle with borderline findings, low risk, bilateral: Secondary | ICD-10-CM | POA: Diagnosis not present

## 2019-09-09 DIAGNOSIS — H04123 Dry eye syndrome of bilateral lacrimal glands: Secondary | ICD-10-CM | POA: Diagnosis not present

## 2019-09-09 DIAGNOSIS — H04122 Dry eye syndrome of left lacrimal gland: Secondary | ICD-10-CM | POA: Diagnosis not present

## 2019-09-16 DIAGNOSIS — I824Z2 Acute embolism and thrombosis of unspecified deep veins of left distal lower extremity: Secondary | ICD-10-CM | POA: Diagnosis not present

## 2019-09-16 DIAGNOSIS — Z86718 Personal history of other venous thrombosis and embolism: Secondary | ICD-10-CM | POA: Diagnosis not present

## 2019-09-19 DIAGNOSIS — C4441 Basal cell carcinoma of skin of scalp and neck: Secondary | ICD-10-CM | POA: Diagnosis not present

## 2019-09-19 DIAGNOSIS — L820 Inflamed seborrheic keratosis: Secondary | ICD-10-CM | POA: Diagnosis not present

## 2019-09-19 DIAGNOSIS — C44311 Basal cell carcinoma of skin of nose: Secondary | ICD-10-CM | POA: Diagnosis not present

## 2019-09-19 DIAGNOSIS — L578 Other skin changes due to chronic exposure to nonionizing radiation: Secondary | ICD-10-CM | POA: Diagnosis not present

## 2019-09-29 DIAGNOSIS — Z86718 Personal history of other venous thrombosis and embolism: Secondary | ICD-10-CM | POA: Diagnosis not present

## 2019-09-29 DIAGNOSIS — S93621D Sprain of tarsometatarsal ligament of right foot, subsequent encounter: Secondary | ICD-10-CM | POA: Diagnosis not present

## 2019-09-29 DIAGNOSIS — R21 Rash and other nonspecific skin eruption: Secondary | ICD-10-CM | POA: Diagnosis not present

## 2019-10-02 DIAGNOSIS — Z86718 Personal history of other venous thrombosis and embolism: Secondary | ICD-10-CM | POA: Insufficient documentation

## 2019-10-02 HISTORY — DX: Personal history of other venous thrombosis and embolism: Z86.718

## 2019-10-03 DIAGNOSIS — R600 Localized edema: Secondary | ICD-10-CM | POA: Diagnosis not present

## 2019-10-03 DIAGNOSIS — I872 Venous insufficiency (chronic) (peripheral): Secondary | ICD-10-CM | POA: Insufficient documentation

## 2019-10-03 DIAGNOSIS — C44311 Basal cell carcinoma of skin of nose: Secondary | ICD-10-CM | POA: Diagnosis not present

## 2019-10-03 DIAGNOSIS — I8310 Varicose veins of unspecified lower extremity with inflammation: Secondary | ICD-10-CM | POA: Diagnosis not present

## 2019-10-03 HISTORY — DX: Venous insufficiency (chronic) (peripheral): I87.2

## 2019-10-04 DIAGNOSIS — H02834 Dermatochalasis of left upper eyelid: Secondary | ICD-10-CM | POA: Diagnosis not present

## 2019-10-04 DIAGNOSIS — H02403 Unspecified ptosis of bilateral eyelids: Secondary | ICD-10-CM | POA: Diagnosis not present

## 2019-10-04 DIAGNOSIS — H02831 Dermatochalasis of right upper eyelid: Secondary | ICD-10-CM | POA: Diagnosis not present

## 2019-10-08 DIAGNOSIS — M160 Bilateral primary osteoarthritis of hip: Secondary | ICD-10-CM | POA: Diagnosis not present

## 2019-10-08 DIAGNOSIS — I872 Venous insufficiency (chronic) (peripheral): Secondary | ICD-10-CM | POA: Diagnosis not present

## 2019-10-08 DIAGNOSIS — E559 Vitamin D deficiency, unspecified: Secondary | ICD-10-CM | POA: Diagnosis not present

## 2019-10-08 DIAGNOSIS — E0590 Thyrotoxicosis, unspecified without thyrotoxic crisis or storm: Secondary | ICD-10-CM | POA: Diagnosis not present

## 2019-10-08 DIAGNOSIS — E785 Hyperlipidemia, unspecified: Secondary | ICD-10-CM | POA: Diagnosis not present

## 2019-10-08 DIAGNOSIS — M19072 Primary osteoarthritis, left ankle and foot: Secondary | ICD-10-CM | POA: Diagnosis not present

## 2019-10-08 DIAGNOSIS — M19071 Primary osteoarthritis, right ankle and foot: Secondary | ICD-10-CM | POA: Diagnosis not present

## 2019-10-24 ENCOUNTER — Encounter: Payer: Self-pay | Admitting: Neurology

## 2019-10-24 ENCOUNTER — Other Ambulatory Visit: Payer: Self-pay

## 2019-10-24 ENCOUNTER — Ambulatory Visit (INDEPENDENT_AMBULATORY_CARE_PROVIDER_SITE_OTHER): Payer: Medicare Other | Admitting: Neurology

## 2019-10-24 VITALS — BP 107/65 | HR 74 | Ht 66.0 in | Wt 188.0 lb

## 2019-10-24 DIAGNOSIS — H02403 Unspecified ptosis of bilateral eyelids: Secondary | ICD-10-CM

## 2019-10-24 DIAGNOSIS — G459 Transient cerebral ischemic attack, unspecified: Secondary | ICD-10-CM

## 2019-10-24 DIAGNOSIS — G43109 Migraine with aura, not intractable, without status migrainosus: Secondary | ICD-10-CM

## 2019-10-24 DIAGNOSIS — R4189 Other symptoms and signs involving cognitive functions and awareness: Secondary | ICD-10-CM | POA: Diagnosis not present

## 2019-10-24 NOTE — Patient Instructions (Signed)
Take aspirin 81mg  daily Follow up in 6 months.

## 2019-10-24 NOTE — Progress Notes (Signed)
NEUROLOGY FOLLOW UP OFFICE NOTE  Karen Shannon [id number]  HISTORY OF PRESENT ILLNESS: Karen Shannon is a 69 year old female whofollows up for migraines.  UPDATE:  She is off of Eliquis.  She is now scheduled to have eyelid surgery next month.    HISTORY: She was diagnosed with "confusional migraines" in the 1970s, where she has severe migraine headache associated with brief confusion lasting 30 minutes. She typically has a couple a year. Initially, she was on nortriptyline for a year but stopped due to side effects.  On the night of 10/19/2018, she woke up with stinging paresthesias involving the left side of her face from forehead, over her left eye and down to the left side of her chin. She also noted moderate left frontotemporal throbbing pain as well as left otalgia, decreased hearing in left ear, blurred vision in left eye, nausea and noted that she was drooling from the corner of both sides of her mouth. She got up and felt dizzy and unsteady on her feet. No slurred speech, speech disturbance, or new unilateral facial droop or extremity weakness. No fevers. She was tested for COVID, which was negative. No specific triggers. She had an MRI of the brain without contrast on 10/29/2018 which was unremarkable. Her PCP thought she may have had a migraine, so she was prescribed topiramate. She stopped after one dose because it didn't make her feel well. Due to the left otalgia and trouble hearing, she saw ENT. Exam was negative. She had mild residual symptoms everyday up untilfor the next 2 weeks. During that time, she did have one of her typical migraines. She noted confusion off and on over those 3 weeks as well. Typical migraines not associated with left facial paresthesias, drooling and dizziness. Carotid doppler from 11/24/2018 showed only minimal heterogenous plaque with no hemodynamically significant stenosis. In November 2020, she was driving to the grocery  store and then she started having a "fuzzy feeling" on her forehead and she suddenly became confused and was disoriented. She didn't know where she was. She had to ask her passenger where to go. It lasted about 5 minutes. Since then, she has had residual symptoms. She reported intermittent slurred speech. She has short term memory problems. She states this is different than her prior migraines with confusion.She has left sided neck pain and ear and her orthopedist started her on Mobic which was ineffective. Left eye is still blurred. Sed rate was 24.  MRI of brain w wo and MRA of head were normal.  Echocardiogram from 01/21/2019 showed EF 55-60% with no cardiac source of embolus.  Two week holter monitor from 12/12 to 02/05/2019 showed no a fib.  She was subsequently found to have a DVT so ASA was switched to Eliquis in the meantime.  She saw Dr. Marshall Cork at [facility] on 01/10/2019 was unremarkable.  Bilateral ptosis was noted.  She was referred for surgery but is on hold while on anticoagulation.  Neuropsychological testing performed by Dr. Hazle Coca on 01/24/2019 was normal and her subjective memory complaints was felt to be secondary to depression and anxiety.  She does have chronic neck pain with cervical spondylosis, which is treated by orthopedics. MRI of cervical spine from 03/27/2018 showed degenerative cervical spondylosis with edematous arthritis at C1-2 on right and C3-4 on the left but no significant foraminal or spinal stenosis. She says it has gradually gotten worse over the past few months.MRI of lumbar spine from same day  showed scoliosis as well as multilevel degenerative changes including foraminal narrowing on left that may be affecting left L3 nerve root and right foraminal narrowing at L4-5 and L5-S1, possibly affecting right L5 nerve.  She has history of mitral valve prolapse with slight murmur and palpitations which is monitored by cardiology.  Echocardiogram from 02/05/2018 was unremarkable with LV EF of 55-60% and grade 1 diastolic dysfunction. 48 hour Holter monitor in January 2020 reportedly revealed no significant arrhythmias. She was started on metoprolol around that time.   Past migraine preventatives: Nortriptyline, topiramate  PAST MEDICAL HISTORY: Past Medical History:  Diagnosis Date  . Acute deep vein thrombosis (DVT) of left peroneal vein ([place]) 04/11/2019  . Cervical spondylosis 03/18/2018  . Chronic bilateral low back pain without sciatica 03/18/2018  . Chronic foot pain, left 03/14/2019   Formatting of this note might be different from the original. Possible fracture base 2nd metatarsal  . Chronic neck pain 03/18/2018  . DDD (degenerative disc disease), cervical 03/18/2018  . DVT femoral (deep venous thrombosis) with thrombophlebitis, left ([place]) 04/11/2019  . Dyspnea on exertion 01/27/2018  . Facet arthritis, degenerative, lumbar spine 03/18/2018  . Family history of malignant neoplasm of breast   . Generalized anxiety disorder 01/24/2019   Possible history of panic attacks  . Greater trochanteric bursitis of both hips 04/10/2018  . Heart murmur 01/27/2018  . History of breast cancer 03/18/2018   S/p bilateral mastectomy  Formatting of this note might be different from the original. S/p bilateral mastectomy  . Left leg swelling 02/23/2019  . Lumbar spondylosis 03/18/2018  . Malignant neoplasm of breast (female), unspecified site   . Mitral valve prolapse 01/27/2018  . Obstructive sleep apnea    Variable CPAP use.  . Other idiopathic scoliosis, lumbar region 03/18/2018  . Pain of left calf 03/14/2019  . Palpitations 01/25/2018  . Pes planus of left foot 02/23/2019  . Skin cancer    basal cell  . Tenosynovitis of left ankle 04/11/2019  . TIA (transient ischemic attack) 01/17/2019  . Varicose veins of left lower extremity   . Ventricular tachycardia ([place]) 02/28/2019    MEDICATIONS: Current Outpatient Medications on File Prior  to Visit  Medication Sig Dispense Refill  . albuterol (PROVENTIL) (2.5 MG/3ML) 0.083% nebulizer solution Take 2.5 mg by nebulization every 6 (six) hours as needed for wheezing or shortness of breath.    Marland Kitchen apixaban (ELIQUIS) 5 MG TABS tablet     . Ascorbic Acid (VITAMIN C) 1000 MG tablet Take 1,000 mg by mouth daily.    Marland Kitchen atorvastatin (LIPITOR) 40 MG tablet Take 40 mg by mouth daily.     . calcium carbonate (OSCAL) 1500 (600 Ca) MG TABS tablet Take 1 tablet by mouth daily.    . calcium-vitamin D (OSCAL WITH D) 250-125 MG-UNIT tablet Take 1 tablet by mouth daily.    . Cholecalciferol (VITAMIN D3) 2000 units TABS Take 2,000 Int'l Units by mouth 1 day or 1 dose.    . dicyclomine (BENTYL) 20 MG tablet Take 20 mg by mouth 4 (four) times daily as needed.    . ergocalciferol (VITAMIN D2) 50000 units capsule Take 50,000 Units by mouth once a week.    . Fluticasone-Salmeterol (ADVAIR) 250-50 MCG/DOSE AEPB Inhale 1 puff into the lungs 2 (two) times daily.    . furosemide (LASIX) 40 MG tablet TAKE 2 TABLETS DAILY AND 1 TABLET IN THE EVENING AS NEEDED  3  . meloxicam (MOBIC) 7.5 MG tablet Take 1 tablet by  mouth daily.    . metoprolol succinate (TOPROL-XL) 25 MG 24 hr tablet TAKE 1 TABLET BY MOUTH EVERY DAY 90 tablet 2  . montelukast (SINGULAIR) 10 MG tablet Take 1 tab daily  12  . ondansetron (ZOFRAN) 4 MG tablet Take 4 mg by mouth every 8 (eight) hours as needed for nausea or vomiting.    . Potassium Chloride ER 20 MEQ TBCR Take 1 tablet by mouth 2 (two) times daily.    . tamsulosin (FLOMAX) 0.4 MG CAPS capsule TAKE 2 (TWO) CAPSULE AT BEDTIME  1  . Tiotropium Bromide-Olodaterol (STIOLTO RESPIMAT) 2.5-2.5 MCG/ACT AERS Inhale 2 puffs into the lungs 1 day or 1 dose.    . vitamin B-12 (CYANOCOBALAMIN) 1000 MCG tablet Take 1,000 mcg by mouth daily.     No current facility-administered medications on file prior to visit.    ALLERGIES: Allergies  Allergen Reactions  . Clarithromycin Diarrhea and Other (See  Comments)  . Prednisone Other (See Comments)  . Sertraline Diarrhea  . Sertraline Hcl Diarrhea    FAMILY HISTORY: Family History  Problem Relation Age of Onset  . Lymphoma Mother 64       deceased 3  . Breast cancer Maternal Aunt        Dx 69s; Deceased 83s  . Lung cancer Maternal Uncle   . Stomach cancer Paternal Uncle        deceased 3  . Liver cancer Maternal Uncle   . Prostate cancer Other        3 mat cousins  . Breast cancer Other 66       mother's mat half-sister  . Emphysema Father    SOCIAL HISTORY: Social History   Socioeconomic History  . Marital status: Single    Spouse name: Not on file  . Number of children: 0  . Years of education: 16  . Highest education level: Bachelor's degree (e.g., BA, AB, BS)  Occupational History  . Not on file  Tobacco Use  . Smoking status: Never Smoker  . Smokeless tobacco: Never Used  Vaping Use  . Vaping Use: Never used  Substance and Sexual Activity  . Alcohol use: Not Currently  . Drug use: Never  . Sexual activity: Not on file  Other Topics Concern  . Not on file  Social History Narrative   Pt is single she lives alone has no children   Right handed   Social Determinants of Health   Financial Resource Strain:   . Difficulty of Paying Living Expenses: Not on file  Food Insecurity:   . Worried About Charity fundraiser in the Last Year: Not on file  . Ran Out of Food in the Last Year: Not on file  Transportation Needs:   . Lack of Transportation (Medical): Not on file  . Lack of Transportation (Non-Medical): Not on file  Physical Activity:   . Days of Exercise per Week: Not on file  . Minutes of Exercise per Session: Not on file  Stress:   . Feeling of Stress : Not on file  Social Connections:   . Frequency of Communication with Friends and Family: Not on file  . Frequency of Social Gatherings with Friends and Family: Not on file  . Attends Religious Services: Not on file  . Active Member of Clubs or  Organizations: Not on file  . Attends Archivist Meetings: Not on file  . Marital Status: Not on file  Intimate Partner Violence:   . Fear of  Current or Ex-Partner: Not on file  . Emotionally Abused: Not on file  . Physically Abused: Not on file  . Sexually Abused: Not on file    PHYSICAL EXAM: Blood pressure 107/65, pulse 74, height 5\' 6"  (1.676 m), weight 188 lb (85.3 kg), SpO2 97 %. General: No acute distress.  Patient appears well-groomed.   Head:  Normocephalic/atraumatic Eyes:  Fundi examined but not visualized Neck: supple, no paraspinal tenderness, full range of motion Heart:  Regular rate and rhythm Lungs:  Clear to auscultation bilaterally Back: No paraspinal tenderness Neurological Exam: alert and oriented to person, place, and time. Attention span and concentration intact, recent and remote memory intact, fund of knowledge intact.  Speech fluent and not dysarthric, language intact.  Bilateral ptosis.  Otherwise, CN II-XII intact. Bulk and tone normal, muscle strength 5-/5 throughout.  Sensation to light touch intact.  Deep tendon reflexes 2+ throughout.  Finger to nose testing intact.  Wide-based cautious gait.  Romberg with mild sway  IMPRESSION: 1.  Neurologic event in September 2020.  Differential diagnosis includes cerebrovascular event associated with migraine or prolonged migraine with aura. Atypical presentation for both, as symptoms persisted for 3 weeks. As she had some focal associated symptoms never before associated with her migraines, I would favor treating for secondary stroke prevention.  2.  Subjective visual complaints (decreased acuity in left eye).  Ophthalmologic exam unremarkable 3.  Subjective memory complaints.  Neuropsychological exam within normal limits. 4.  Subjective left sided hearing loss.  ENT evaluation unremarkable. 5.  Left sided neck and ear pain, likely secondary to cervical spondylosis    PLAN: 1.  As she is now off of Eliqus,  recommend restarting ASA 81mg  daily 2.  Follow up in 6 months.  Metta Clines, DO  CC: Esau Grew, MD

## 2019-11-01 ENCOUNTER — Ambulatory Visit (INDEPENDENT_AMBULATORY_CARE_PROVIDER_SITE_OTHER): Payer: Medicare Other | Admitting: Cardiology

## 2019-11-01 ENCOUNTER — Other Ambulatory Visit: Payer: Self-pay

## 2019-11-01 ENCOUNTER — Encounter: Payer: Self-pay | Admitting: Cardiology

## 2019-11-01 VITALS — BP 90/64 | HR 76 | Ht 66.0 in | Wt 182.0 lb

## 2019-11-01 DIAGNOSIS — I472 Ventricular tachycardia, unspecified: Secondary | ICD-10-CM

## 2019-11-01 DIAGNOSIS — R002 Palpitations: Secondary | ICD-10-CM

## 2019-11-01 DIAGNOSIS — I82412 Acute embolism and thrombosis of left femoral vein: Secondary | ICD-10-CM | POA: Diagnosis not present

## 2019-11-01 NOTE — Addendum Note (Signed)
Addended by: Senaida Ores on: 11/01/2019 01:57 PM   Modules accepted: Orders

## 2019-11-01 NOTE — Patient Instructions (Signed)

## 2019-11-01 NOTE — Progress Notes (Signed)
Cardiology Office Note:    Date:  11/01/2019   ID:  Karen Shannon, DOB 11-24-1950, MRN [id number]  PCP:  Nicoletta Dress, MD  Cardiologist:  Jenne Campus, MD    Referring MD: Nicoletta Dress, MD   Chief Complaint  Patient presents with  . Follow-up  I am doing fine but had some chest pain yesterday  History of Present Illness:    Karen Shannon is a 69 y.o. female with past medical history significant for TIA, DVT which was provoked secondary to like injury anticoagulation has been withdrawn already, mitral regurgitation, history of nonsustained ventricular tachycardia.  She comes today 2 months for follow-up overall she is doing fine she said she does have some balance problem and she fell down few times but no passing out.  Is always tripped on something she end up having some injury to her leg but that gradually getting better.  She walks around using cane and it seems to be helping.  Described to have few episode of chest pain does usually happening at rest lasting for few hours straight.  Intermittent fact yesterday she had episode of chest pain which lasted for few hours.  Taking deep breath coughing did not make any difference she graded sensation as 3-4 in scale up to 10.  There was no shortness of breath no sweating.  She pinpoint location above her left breast when it happened.  Pressing this area today does not reproduce the pain.  Past Medical History:  Diagnosis Date  . Acute deep vein thrombosis (DVT) of left peroneal vein ([place]) 04/11/2019  . Cervical spondylosis 03/18/2018  . Chronic bilateral low back pain without sciatica 03/18/2018  . Chronic foot pain, left 03/14/2019   Formatting of this note might be different from the original. Possible fracture base 2nd metatarsal  . Chronic neck pain 03/18/2018  . COPD (chronic obstructive pulmonary disease) ([facility])    Dr. Delena Bali  . DDD (degenerative disc disease), cervical 03/18/2018  . DVT femoral (deep venous  thrombosis) with thrombophlebitis, left ([place]) 04/11/2019  . Dyspnea on exertion 01/27/2018  . Facet arthritis, degenerative, lumbar spine 03/18/2018  . Family history of malignant neoplasm of breast   . Generalized anxiety disorder 01/24/2019   Possible history of panic attacks  . Greater trochanteric bursitis of both hips 04/10/2018  . Heart murmur 01/27/2018  . History of breast cancer 03/18/2018   S/p bilateral mastectomy  Formatting of this note might be different from the original. S/p bilateral mastectomy  . Left leg swelling 02/23/2019  . Lumbar spondylosis 03/18/2018  . Malignant neoplasm of breast (female), unspecified site   . Mitral valve prolapse 01/27/2018  . Obstructive sleep apnea    Variable CPAP use.  . Other idiopathic scoliosis, lumbar region 03/18/2018  . Pain of left calf 03/14/2019  . Palpitations 01/25/2018  . Pes planus of left foot 02/23/2019  . Skin cancer    basal cell  . Tenosynovitis of left ankle 04/11/2019  . TIA (transient ischemic attack) 01/17/2019  . Varicose veins of left lower extremity   . Ventricular tachycardia ([place]) 02/28/2019    Past Surgical History:  Procedure Laterality Date  . BUNIONECTOMY Right 1999  . MASTECTOMY Bilateral 2012  . NASAL SEPTUM SURGERY  1996  . SKIN CANCER EXCISION  2018   Top of head   . SKIN CANCER EXCISION  07/2019   Nose  . TENDON TRANSFER Right 1999   foot    Current Medications: Current Meds  Medication Sig  . Ascorbic Acid (VITAMIN C) 1000 MG tablet Take 1,000 mg by mouth daily.  Marland Kitchen aspirin EC 81 MG tablet Take 81 mg by mouth daily. Swallow whole.  Marland Kitchen atorvastatin (LIPITOR) 40 MG tablet Take 40 mg by mouth daily.   . calcium carbonate (OSCAL) 1500 (600 Ca) MG TABS tablet Take 1 tablet by mouth daily.  . calcium-vitamin D (OSCAL WITH D) 250-125 MG-UNIT tablet Take 1 tablet by mouth daily.  . Cholecalciferol (VITAMIN D3) 2000 units TABS Take 2,000 Int'l Units by mouth 1 day or 1 dose.  . dicyclomine (BENTYL) 20 MG tablet  Take 20 mg by mouth 4 (four) times daily as needed.  . ergocalciferol (VITAMIN D2) 50000 units capsule Take 50,000 Units by mouth once a week.  . furosemide (LASIX) 40 MG tablet 40 mg daily.  . meloxicam (MOBIC) 7.5 MG tablet Take 1 tablet by mouth daily.  . metoprolol succinate (TOPROL-XL) 25 MG 24 hr tablet TAKE 1 TABLET BY MOUTH EVERY DAY  . montelukast (SINGULAIR) 10 MG tablet Take 1 tab daily  . nitrofurantoin (MACRODANTIN) 100 MG capsule Take 100 mg by mouth at bedtime.  . ondansetron (ZOFRAN) 4 MG tablet Take 4 mg by mouth every 8 (eight) hours as needed for nausea or vomiting.  . Potassium Chloride ER 20 MEQ TBCR Take 1 tablet by mouth 2 (two) times daily.  . Tiotropium Bromide-Olodaterol (STIOLTO RESPIMAT) 2.5-2.5 MCG/ACT AERS Inhale 2 puffs into the lungs 1 day or 1 dose.  . triamcinolone cream (KENALOG) 0.5 % Apply topically.  . vitamin B-12 (CYANOCOBALAMIN) 1000 MCG tablet Take 1,000 mcg by mouth daily.  . [DISCONTINUED] MYRBETRIQ 50 MG TB24 tablet Take 50 mg by mouth in the morning and at bedtime.  . [DISCONTINUED] tamsulosin (FLOMAX) 0.4 MG CAPS capsule TAKE 2 (TWO) CAPSULE AT BEDTIME     Allergies:   Clarithromycin, Prednisone, Sertraline, and Sertraline hcl   Social History   Socioeconomic History  . Marital status: Single    Spouse name: Not on file  . Number of children: 0  . Years of education: 16  . Highest education level: Bachelor's degree (e.g., BA, AB, BS)  Occupational History  . Not on file  Tobacco Use  . Smoking status: Never Smoker  . Smokeless tobacco: Never Used  Vaping Use  . Vaping Use: Never used  Substance and Sexual Activity  . Alcohol use: Not Currently  . Drug use: Never  . Sexual activity: Not on file  Other Topics Concern  . Not on file  Social History Narrative   Pt is single she lives alone has no children   Right handed   Social Determinants of Health   Financial Resource Strain:   . Difficulty of Paying Living Expenses: Not on  file  Food Insecurity:   . Worried About Charity fundraiser in the Last Year: Not on file  . Ran Out of Food in the Last Year: Not on file  Transportation Needs:   . Lack of Transportation (Medical): Not on file  . Lack of Transportation (Non-Medical): Not on file  Physical Activity:   . Days of Exercise per Week: Not on file  . Minutes of Exercise per Session: Not on file  Stress:   . Feeling of Stress : Not on file  Social Connections:   . Frequency of Communication with Friends and Family: Not on file  . Frequency of Social Gatherings with Friends and Family: Not on file  . Attends Religious  Services: Not on file  . Active Member of Clubs or Organizations: Not on file  . Attends Archivist Meetings: Not on file  . Marital Status: Not on file     Family History: [name] history includes Breast cancer in her maternal aunt; Breast cancer (age of onset: 58) in an other family member; Emphysema in her father; Liver cancer in her maternal uncle; Lung cancer in her maternal uncle; Lymphoma (age of onset: 26) in her mother; Prostate cancer in an other family member; Stomach cancer in her paternal uncle. ROS:   Please see the history of present illness.    All 14 point review of systems negative except as described per history of present illness  EKGs/Labs/Other Studies Reviewed:      Recent Labs: No results found for requested labs within last 8760 hours.  Recent Lipid Panel    Component Value Date/Time   CHOL 174 02/28/2019 1447   TRIG 83 02/28/2019 1447   HDL 57 02/28/2019 1447   CHOLHDL 3.1 02/28/2019 1447   LDLCALC 102 (H) 02/28/2019 1447    Physical Exam:    VS:  BP 90/64 (BP Location: Right Arm, Patient Position: Sitting, Cuff Size: Normal)   Pulse 76   Ht 5\' 6"  (1.676 m)   Wt 182 lb (82.6 kg)   SpO2 97%   BMI 29.38 kg/m     Wt Readings from Last 3 Encounters:  11/01/19 182 lb (82.6 kg)  10/24/19 188 lb (85.3 kg)  05/27/19 185 lb 9.6 oz  (84.2 kg)     GEN:  Well nourished, well developed in no acute distress HEENT: Normal NECK: No JVD; No carotid bruits LYMPHATICS: No lymphadenopathy CARDIAC: RRR, no murmurs, no rubs, no gallops RESPIRATORY:  Clear to auscultation without rales, wheezing or rhonchi  ABDOMEN: Soft, non-tender, non-distended MUSCULOSKELETAL:  No edema; No deformity  SKIN: Warm and dry LOWER EXTREMITIES: no swelling NEUROLOGIC:  Alert and oriented x 3 PSYCHIATRIC:  Normal affect   ASSESSMENT:    1. DVT femoral (deep venous thrombosis) with thrombophlebitis, left ([place])   2. Ventricular tachycardia ([facility])   3. Palpitations    PLAN:    In order of problems listed above:  1. History of DVT which was provoked anticoagulation has been withdrawn. 2. Ventricle tachycardia.  Stress test done was negative, echocardiogram showed preserved ejection fraction 3. Palpitations: Denies having any. 4. Atypical chest pain.  I will ask you to have EKG done today.  I doubt very much that this is coronary event.  I did review her stress test from February which was negative.  I told her if she started having pain again she need to go to the emergency room.  I will also check her troponin.   Medication Adjustments/Labs and Tests Ordered: Current medicines are reviewed at length with the patient today.  Concerns regarding medicines are outlined above.  No orders of the defined types were placed in this encounter.  Medication changes: No orders of the defined types were placed in this encounter.   Signed, Park Liter, MD, [facility] 11/01/2019 1:50 PM    [facility]

## 2019-11-08 DIAGNOSIS — Z23 Encounter for immunization: Secondary | ICD-10-CM | POA: Diagnosis not present

## 2019-11-20 DIAGNOSIS — Z20822 Contact with and (suspected) exposure to covid-19: Secondary | ICD-10-CM | POA: Diagnosis not present

## 2019-11-22 DIAGNOSIS — J45901 Unspecified asthma with (acute) exacerbation: Secondary | ICD-10-CM | POA: Diagnosis not present

## 2019-12-05 DIAGNOSIS — J449 Chronic obstructive pulmonary disease, unspecified: Secondary | ICD-10-CM | POA: Diagnosis not present

## 2019-12-05 DIAGNOSIS — H02831 Dermatochalasis of right upper eyelid: Secondary | ICD-10-CM | POA: Diagnosis not present

## 2019-12-05 DIAGNOSIS — H02834 Dermatochalasis of left upper eyelid: Secondary | ICD-10-CM | POA: Diagnosis not present

## 2019-12-05 DIAGNOSIS — H02403 Unspecified ptosis of bilateral eyelids: Secondary | ICD-10-CM | POA: Diagnosis not present

## 2019-12-05 DIAGNOSIS — Z8673 Personal history of transient ischemic attack (TIA), and cerebral infarction without residual deficits: Secondary | ICD-10-CM | POA: Diagnosis not present

## 2019-12-05 DIAGNOSIS — Z7982 Long term (current) use of aspirin: Secondary | ICD-10-CM | POA: Diagnosis not present

## 2019-12-07 DIAGNOSIS — Z23 Encounter for immunization: Secondary | ICD-10-CM | POA: Diagnosis not present

## 2019-12-12 DIAGNOSIS — H02403 Unspecified ptosis of bilateral eyelids: Secondary | ICD-10-CM | POA: Diagnosis not present

## 2019-12-12 DIAGNOSIS — Z9889 Other specified postprocedural states: Secondary | ICD-10-CM | POA: Diagnosis not present

## 2019-12-12 DIAGNOSIS — Z48810 Encounter for surgical aftercare following surgery on the sense organs: Secondary | ICD-10-CM | POA: Diagnosis not present

## 2019-12-21 NOTE — Progress Notes (Signed)
[place]  [address] [place],  [place]  [address] [phone]  Clinic Day:  12/21/2019  Referring physician: Nicoletta Dress, MD   This document serves as a record of services personally performed by Hosie Poisson, MD. It was created on their behalf by [facility], a trained medical scribe. The creation of this record is based on the scribe's personal observations and the provider's statements to them.   CHIEF COMPLAINT:  CC: History of stage IA triple negative breast cancer  Current Treatment:  Surveillance   HISTORY OF PRESENT ILLNESS:  Karen Shannon is a 69 y.o. female with a history of stage IA (T1b N0 M0) triple negative left breast cancer diagnosed in January 2012.  She underwent bilateral mastectomies at her request.  Pathology revealed a 9 mm, grade 1, invasive ductal carcinoma with negative nodes.  Estrogen and progesterone receptors were negative and her 2 Neu negative.  Ki 67 was 9%.  She was placed on observation alone.  She was referred to genetic counselor at [facility] due to her triple negative disease.  She underwent testing with the [facility] Breast Next Panel.  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  She has multiple comorbidities, including severe COPD.  She reported 2 strokes since September 2020.  She is followed by by Dr. Metta Clines, a neurologist, at [facility].  She stated she was scheduled for an MRI/MRA head at [facility], as well as a cognitive test in January 2021.  We were concerned about temporal arteritis, but a sed rate was normal.  She was found to have hypokalemia, so we placed her on potassium 20 mEq daily.  She also complained of nausea after eating and intermittent sharp, shooting abdominal pain.  We gave her ondansetron 4 mg every 4 hours as needed for the nausea.  CT abdomen and pelvis did not reveal any acute abnormality.   Colon diverticulosis without diverticulitis was noted.  She is up-to-date on screening colonoscopy, with the last being in November 2018 with Dr. Melina Copa, at which time had removal of a small polyp.  Follow-up colonoscopy in 5 years was recommended.  She describes transient ischemic attacks in September and October 2020. She states Dr. Tomi Likens did not do the MRI/MRA head, as the sed rate was normal.  She states she had decreased memory on her cognitive test and he plans to repeat that yearly.  She had a virtual visit with Dr. Tomi Likens on March 10th and she states he felt she was doing well.  She was found to have a deep venous thrombus of the left leg on February 1st and was placed on Eliquis.  MRI foot did not reveal any fracture of the foot either.  She has a history of mitral valve prolapse and murmur, for which she sees Dr. Agustin Cree.  She states she wore a heart monitor for 2 weeks in February, which apparently revealed irregular rhythm, so she underwent stress test in his office, which was felt to be normal.  She had ultrasounds of the carotid arteries, but does not have the results.  She states she had cortisone injections in the bilateral hips on March 4th.     INTERVAL HISTORY:  [name] is here for routine follow up and states that she had eyelid surgery on October 25th and is doing well.  She denies any complaints.  She has received her seasonal flu vaccine and both  COVID vaccines.  She undergoes routine lab work at Dr. Denton Lank office.  Her  appetite is good, and she has gained 4 pounds since her last visit.  She denies fever, chills or other signs of infection.  She denies nausea, vomiting, bowel issues, or abdominal pain.  She denies sore throat, cough, dyspnea, or chest pain.   REVIEW OF SYSTEMS:  Review of Systems  All other systems reviewed and are negative.    VITALS:  There were no vitals taken for this visit.  Wt Readings from Last 3 Encounters:  11/01/19 182 lb (82.6 kg)  10/24/19 188  lb (85.3 kg)  05/27/19 185 lb 9.6 oz (84.2 kg)    There is no height or weight on file to calculate BMI.  Performance status (ECOG): 0 - Asymptomatic  PHYSICAL EXAM:  Physical Exam Constitutional:      General: She is not in acute distress.    Appearance: Normal appearance. She is normal weight.  HENT:     Head: Normocephalic and atraumatic.  Eyes:     General: No scleral icterus.    Extraocular Movements: Extraocular movements intact.     Conjunctiva/sclera: Conjunctivae normal.     Pupils: Pupils are equal, round, and reactive to light.     Comments: She has periorbital bruising  Cardiovascular:     Rate and Rhythm: Normal rate and regular rhythm.     Pulses: Normal pulses.     Heart sounds: Normal heart sounds. No murmur heard.  No friction rub. No gallop.   Pulmonary:     Effort: Pulmonary effort is normal. No respiratory distress.     Breath sounds: Normal breath sounds.  Chest:     Breasts:        Right: Absent.        Left: Absent.     Comments: Bilateral mastectomies are negative. Abdominal:     General: Bowel sounds are normal. There is no distension.     Palpations: Abdomen is soft. There is no mass.     Tenderness: There is no abdominal tenderness.  Musculoskeletal:        General: Normal range of motion.     Cervical back: Normal range of motion and neck supple.     Right lower leg: No edema.     Left lower leg: No edema.  Lymphadenopathy:     Cervical: No cervical adenopathy.  Skin:    General: Skin is warm and dry.  Neurological:     General: No focal deficit present.     Mental Status: She is alert and oriented to person, place, and time. Mental status is at baseline.  Psychiatric:        Mood and Affect: Mood normal.        Behavior: Behavior normal.        Thought Content: Thought content normal.        Judgment: Judgment normal.     LABS:  No flowsheet data found. No flowsheet data found.   STUDIES:  No results found.   Allergies:   Allergies  Allergen Reactions  . Clarithromycin Diarrhea and Other (See Comments)  . Prednisone Other (See Comments)  . Sertraline Diarrhea  . Sertraline Hcl Diarrhea    Current Medications: Current Outpatient Medications  Medication Sig Dispense Refill  . Ascorbic Acid (VITAMIN C) 1000 MG tablet Take 1,000 mg by mouth daily.    Marland Kitchen aspirin EC 81 MG tablet Take 81 mg by mouth daily. Swallow whole.    Marland Kitchen  atorvastatin (LIPITOR) 40 MG tablet Take 40 mg by mouth daily.     . calcium carbonate (OSCAL) 1500 (600 Ca) MG TABS tablet Take 1 tablet by mouth daily.    . calcium-vitamin D (OSCAL WITH D) 250-125 MG-UNIT tablet Take 1 tablet by mouth daily.    . Cholecalciferol (VITAMIN D3) 2000 units TABS Take 2,000 Int'l Units by mouth 1 day or 1 dose.    . dicyclomine (BENTYL) 20 MG tablet Take 20 mg by mouth 4 (four) times daily as needed.    . ergocalciferol (VITAMIN D2) 50000 units capsule Take 50,000 Units by mouth once a week.    . furosemide (LASIX) 40 MG tablet 40 mg daily.    . meloxicam (MOBIC) 7.5 MG tablet Take 1 tablet by mouth daily.    . metoprolol succinate (TOPROL-XL) 25 MG 24 hr tablet TAKE 1 TABLET BY MOUTH EVERY DAY 90 tablet 2  . montelukast (SINGULAIR) 10 MG tablet Take 1 tab daily  12  . nitrofurantoin (MACRODANTIN) 100 MG capsule Take 100 mg by mouth at bedtime.    . ondansetron (ZOFRAN) 4 MG tablet Take 4 mg by mouth every 8 (eight) hours as needed for nausea or vomiting.    . Potassium Chloride ER 20 MEQ TBCR Take 1 tablet by mouth 2 (two) times daily.    . Tiotropium Bromide-Olodaterol (STIOLTO RESPIMAT) 2.5-2.5 MCG/ACT AERS Inhale 2 puffs into the lungs 1 day or 1 dose.    . triamcinolone cream (KENALOG) 0.5 % Apply topically.    . vitamin B-12 (CYANOCOBALAMIN) 1000 MCG tablet Take 1,000 mcg by mouth daily.     No current facility-administered medications for this visit.     ASSESSMENT & PLAN:   Assessment:   1. History of stage I triple negative breast cancer.   The patient remains without evidence of recurrence.  2. History of transient ischemic attacks.  She denies further episodes  She continues to follow with Dr. Tomi Likens.  3. Nausea after eating with intermittent abdominal pain.  CT imaging in November did not reveal any acute abnormality.  She is using ondansetron prior to meals and denies continued nausea.    4. Left foot pain, without obvious fracture.  She continues to follow with Dr. Gretta Arab.  5. Deep venous thrombosis of the left lower extremity.  This may have been provoked by her foot pain and inactivity.    Plan: She will have labs soon with Dr. Delena Bali.  We will plan to see her back in 6 months for re-examination, as she is very uncomfortable spacing her visits out to yearly.  The patient understands the plans discussed today and is in agreement with them.  She knows to contact our office if she develops concerns regarding her breast cancer.   I provided 10 minutes (11:13 AM - 11:23 AM) of face-to-face time during this this encounter and > 50% was spent counseling as documented under my assessment and plan.    Derwood Kaplan, MD [facility] [address] [place] [address] Dept: [phone] Dept Fax: [phone]   I, Rita Ohara, am acting as scribe for Derwood Kaplan, MD  I have reviewed this report as typed by the medical scribe, and it is complete and accurate.

## 2019-12-22 ENCOUNTER — Other Ambulatory Visit: Payer: Self-pay

## 2019-12-22 ENCOUNTER — Encounter: Payer: Self-pay | Admitting: Oncology

## 2019-12-22 ENCOUNTER — Telehealth: Payer: Self-pay | Admitting: Oncology

## 2019-12-22 ENCOUNTER — Inpatient Hospital Stay: Payer: Medicare Other | Attending: Oncology | Admitting: Oncology

## 2019-12-22 VITALS — BP 144/70 | HR 70 | Temp 97.6°F | Resp 18 | Ht 66.0 in | Wt 186.6 lb

## 2019-12-22 DIAGNOSIS — Z8673 Personal history of transient ischemic attack (TIA), and cerebral infarction without residual deficits: Secondary | ICD-10-CM | POA: Diagnosis not present

## 2019-12-22 DIAGNOSIS — Z171 Estrogen receptor negative status [ER-]: Secondary | ICD-10-CM | POA: Diagnosis not present

## 2019-12-22 DIAGNOSIS — I82402 Acute embolism and thrombosis of unspecified deep veins of left lower extremity: Secondary | ICD-10-CM

## 2019-12-22 DIAGNOSIS — C50112 Malignant neoplasm of central portion of left female breast: Secondary | ICD-10-CM | POA: Diagnosis not present

## 2019-12-22 NOTE — Telephone Encounter (Signed)
Per 11/11 LOS 6MTH APPT GIVEN TO PATIENT

## 2019-12-23 ENCOUNTER — Inpatient Hospital Stay: Payer: Medicare Other | Admitting: Oncology

## 2020-01-02 DIAGNOSIS — M160 Bilateral primary osteoarthritis of hip: Secondary | ICD-10-CM | POA: Diagnosis not present

## 2020-01-02 DIAGNOSIS — M25552 Pain in left hip: Secondary | ICD-10-CM | POA: Diagnosis not present

## 2020-01-02 DIAGNOSIS — M7062 Trochanteric bursitis, left hip: Secondary | ICD-10-CM | POA: Diagnosis not present

## 2020-01-02 DIAGNOSIS — M7061 Trochanteric bursitis, right hip: Secondary | ICD-10-CM | POA: Diagnosis not present

## 2020-01-07 ENCOUNTER — Other Ambulatory Visit: Payer: Self-pay | Admitting: Hematology and Oncology

## 2020-01-07 DIAGNOSIS — B9689 Other specified bacterial agents as the cause of diseases classified elsewhere: Secondary | ICD-10-CM | POA: Diagnosis not present

## 2020-01-07 DIAGNOSIS — Z6831 Body mass index (BMI) 31.0-31.9, adult: Secondary | ICD-10-CM | POA: Diagnosis not present

## 2020-01-07 DIAGNOSIS — M19072 Primary osteoarthritis, left ankle and foot: Secondary | ICD-10-CM | POA: Diagnosis not present

## 2020-01-07 DIAGNOSIS — M160 Bilateral primary osteoarthritis of hip: Secondary | ICD-10-CM | POA: Diagnosis not present

## 2020-01-07 DIAGNOSIS — E785 Hyperlipidemia, unspecified: Secondary | ICD-10-CM | POA: Diagnosis not present

## 2020-01-07 DIAGNOSIS — J208 Acute bronchitis due to other specified organisms: Secondary | ICD-10-CM | POA: Diagnosis not present

## 2020-01-07 DIAGNOSIS — E0590 Thyrotoxicosis, unspecified without thyrotoxic crisis or storm: Secondary | ICD-10-CM | POA: Diagnosis not present

## 2020-01-07 DIAGNOSIS — M19071 Primary osteoarthritis, right ankle and foot: Secondary | ICD-10-CM | POA: Diagnosis not present

## 2020-01-07 DIAGNOSIS — E876 Hypokalemia: Secondary | ICD-10-CM

## 2020-01-07 DIAGNOSIS — E559 Vitamin D deficiency, unspecified: Secondary | ICD-10-CM | POA: Diagnosis not present

## 2020-01-09 ENCOUNTER — Other Ambulatory Visit: Payer: Self-pay

## 2020-01-09 NOTE — Telephone Encounter (Addendum)
I called pt to notify her that we had received a potassium refill request from pharmacy, but that providers prefer that her PCP take care of this. Pt ok with below recommendations. [phone].   ----- Message from Marvia Pickles, PA-C sent at 01/09/2020  2:08 PM EST ----- Regarding: FW: Rx K? Please let patient know we want Dr. Delena Bali to take over potassium prescription as we don't see her and check her labs regularly. Thanks! ----- Message ----- From: Derwood Kaplan, MD Sent: 01/09/2020  12:35 PM EST To: Marvia Pickles, PA-C Subject: Rx K?                                          No, we need Dr. Delena Bali to take this over, he checks labs q 3 months ----- Message ----- From: Marvia Pickles, PA-C Sent: 01/09/2020  10:49 AM EST To: Derwood Kaplan, MD  Should we continue prescribing potassium? We did not do labs when you saw her in Nov. Thanks!

## 2020-01-30 DIAGNOSIS — N3281 Overactive bladder: Secondary | ICD-10-CM | POA: Diagnosis not present

## 2020-01-30 DIAGNOSIS — R399 Unspecified symptoms and signs involving the genitourinary system: Secondary | ICD-10-CM | POA: Diagnosis not present

## 2020-02-06 DIAGNOSIS — K5792 Diverticulitis of intestine, part unspecified, without perforation or abscess without bleeding: Secondary | ICD-10-CM | POA: Diagnosis not present

## 2020-02-07 DIAGNOSIS — L820 Inflamed seborrheic keratosis: Secondary | ICD-10-CM | POA: Diagnosis not present

## 2020-02-07 DIAGNOSIS — L209 Atopic dermatitis, unspecified: Secondary | ICD-10-CM | POA: Diagnosis not present

## 2020-02-21 DIAGNOSIS — Z9889 Other specified postprocedural states: Secondary | ICD-10-CM | POA: Diagnosis not present

## 2020-03-13 DIAGNOSIS — S92351B Displaced fracture of fifth metatarsal bone, right foot, initial encounter for open fracture: Secondary | ICD-10-CM | POA: Insufficient documentation

## 2020-03-13 DIAGNOSIS — I82409 Acute embolism and thrombosis of unspecified deep veins of unspecified lower extremity: Secondary | ICD-10-CM

## 2020-03-13 HISTORY — DX: Displaced fracture of fifth metatarsal bone, right foot, initial encounter for open fracture: S92.351B

## 2020-03-13 HISTORY — DX: Acute embolism and thrombosis of unspecified deep veins of unspecified lower extremity: I82.409

## 2020-03-23 ENCOUNTER — Other Ambulatory Visit: Payer: Self-pay

## 2020-03-23 DIAGNOSIS — J449 Chronic obstructive pulmonary disease, unspecified: Secondary | ICD-10-CM | POA: Insufficient documentation

## 2020-03-23 DIAGNOSIS — I8392 Asymptomatic varicose veins of left lower extremity: Secondary | ICD-10-CM | POA: Insufficient documentation

## 2020-03-23 DIAGNOSIS — S99911A Unspecified injury of right ankle, initial encounter: Secondary | ICD-10-CM | POA: Diagnosis not present

## 2020-03-23 DIAGNOSIS — R600 Localized edema: Secondary | ICD-10-CM | POA: Diagnosis not present

## 2020-03-23 DIAGNOSIS — S92405A Nondisplaced unspecified fracture of left great toe, initial encounter for closed fracture: Secondary | ICD-10-CM | POA: Diagnosis not present

## 2020-03-23 DIAGNOSIS — I82441 Acute embolism and thrombosis of right tibial vein: Secondary | ICD-10-CM | POA: Diagnosis not present

## 2020-03-24 DIAGNOSIS — Z6833 Body mass index (BMI) 33.0-33.9, adult: Secondary | ICD-10-CM | POA: Diagnosis not present

## 2020-03-24 DIAGNOSIS — M79661 Pain in right lower leg: Secondary | ICD-10-CM | POA: Diagnosis not present

## 2020-03-24 DIAGNOSIS — M25562 Pain in left knee: Secondary | ICD-10-CM | POA: Diagnosis not present

## 2020-03-24 DIAGNOSIS — I824Z1 Acute embolism and thrombosis of unspecified deep veins of right distal lower extremity: Secondary | ICD-10-CM | POA: Diagnosis not present

## 2020-03-24 DIAGNOSIS — M25561 Pain in right knee: Secondary | ICD-10-CM | POA: Diagnosis not present

## 2020-03-26 DIAGNOSIS — M818 Other osteoporosis without current pathological fracture: Secondary | ICD-10-CM | POA: Diagnosis not present

## 2020-03-26 DIAGNOSIS — Z9181 History of falling: Secondary | ICD-10-CM | POA: Diagnosis not present

## 2020-03-26 DIAGNOSIS — M25561 Pain in right knee: Secondary | ICD-10-CM | POA: Diagnosis not present

## 2020-03-26 DIAGNOSIS — M25562 Pain in left knee: Secondary | ICD-10-CM | POA: Diagnosis not present

## 2020-03-26 DIAGNOSIS — M79661 Pain in right lower leg: Secondary | ICD-10-CM | POA: Diagnosis not present

## 2020-04-04 ENCOUNTER — Ambulatory Visit: Payer: Medicare Other | Admitting: Cardiology

## 2020-04-07 DIAGNOSIS — M19072 Primary osteoarthritis, left ankle and foot: Secondary | ICD-10-CM | POA: Diagnosis not present

## 2020-04-07 DIAGNOSIS — Z79899 Other long term (current) drug therapy: Secondary | ICD-10-CM | POA: Diagnosis not present

## 2020-04-07 DIAGNOSIS — E559 Vitamin D deficiency, unspecified: Secondary | ICD-10-CM | POA: Diagnosis not present

## 2020-04-07 DIAGNOSIS — I824Z1 Acute embolism and thrombosis of unspecified deep veins of right distal lower extremity: Secondary | ICD-10-CM | POA: Diagnosis not present

## 2020-04-07 DIAGNOSIS — M160 Bilateral primary osteoarthritis of hip: Secondary | ICD-10-CM | POA: Diagnosis not present

## 2020-04-07 DIAGNOSIS — E785 Hyperlipidemia, unspecified: Secondary | ICD-10-CM | POA: Diagnosis not present

## 2020-04-07 DIAGNOSIS — E0590 Thyrotoxicosis, unspecified without thyrotoxic crisis or storm: Secondary | ICD-10-CM | POA: Diagnosis not present

## 2020-04-07 DIAGNOSIS — I341 Nonrheumatic mitral (valve) prolapse: Secondary | ICD-10-CM | POA: Diagnosis not present

## 2020-04-07 DIAGNOSIS — M19071 Primary osteoarthritis, right ankle and foot: Secondary | ICD-10-CM | POA: Diagnosis not present

## 2020-04-07 DIAGNOSIS — J309 Allergic rhinitis, unspecified: Secondary | ICD-10-CM | POA: Diagnosis not present

## 2020-04-14 DIAGNOSIS — E0590 Thyrotoxicosis, unspecified without thyrotoxic crisis or storm: Secondary | ICD-10-CM | POA: Diagnosis not present

## 2020-04-14 DIAGNOSIS — E785 Hyperlipidemia, unspecified: Secondary | ICD-10-CM | POA: Diagnosis not present

## 2020-04-14 DIAGNOSIS — Z79899 Other long term (current) drug therapy: Secondary | ICD-10-CM | POA: Diagnosis not present

## 2020-04-14 DIAGNOSIS — E559 Vitamin D deficiency, unspecified: Secondary | ICD-10-CM | POA: Diagnosis not present

## 2020-04-18 ENCOUNTER — Other Ambulatory Visit: Payer: Self-pay

## 2020-04-18 ENCOUNTER — Encounter: Payer: Self-pay | Admitting: Neurology

## 2020-04-18 ENCOUNTER — Telehealth (INDEPENDENT_AMBULATORY_CARE_PROVIDER_SITE_OTHER): Payer: Medicare Other | Admitting: Neurology

## 2020-04-18 DIAGNOSIS — G459 Transient cerebral ischemic attack, unspecified: Secondary | ICD-10-CM | POA: Diagnosis not present

## 2020-04-18 DIAGNOSIS — R4189 Other symptoms and signs involving cognitive functions and awareness: Secondary | ICD-10-CM

## 2020-04-18 DIAGNOSIS — H539 Unspecified visual disturbance: Secondary | ICD-10-CM | POA: Diagnosis not present

## 2020-04-18 DIAGNOSIS — G43109 Migraine with aura, not intractable, without status migrainosus: Secondary | ICD-10-CM

## 2020-04-18 NOTE — Progress Notes (Signed)
Virtual Visit via Video Note The purpose of this virtual visit is to provide medical care while limiting exposure to the novel coronavirus.    Consent was obtained for video visit:  Yes.   Answered questions that patient had about telehealth interaction:  Yes.   I discussed the limitations, risks, security and privacy concerns of performing an evaluation and management service by telemedicine. I also discussed with the patient that there may be a patient responsible charge related to this service. The patient expressed understanding and agreed to proceed.  Pt location: Home Physician Location: office Name of referring provider:  Nicoletta Dress, MD I connected with Karen Shannon at patients initiation/request on 04/18/2020 at  9:50 AM EST by video enabled telemedicine application and verified that I am speaking with the correct person using two identifiers. Pt MRN:  [id number] Pt DOB:  1950-10-17 Video Participants:  Karen Shannon  Assessment and Plan:   1. Neurologic event in September 2020.  Differential diagnosis includes cerebrovascular event associated with migraine or prolonged migraine with aura. Atypical presentation for both, as symptoms persisted for 3 weeks. As she had some focal associated symptoms never before associated with her migraines, I would favor treating for secondary stroke prevention. 2.  Subjective visual deficits (decreased acuity in left eye).  Ophthalmologic exam unremarkable 3.  Subjective memory problems.  Neuropsychological exam within normal limits. 4.  Subjective left sided hearing loss.  ENT evaluation unremarkable.   1.  Secondary stroke prevention as managed by PCP: - ASA - Statin.  LDL goal less than 70 - Blood pressure control - Hgb A1c goal less than 7 2.  Follow up as needed.  History of Present Illness:  Karen Shannon is a 70 year old female whofollows up for migraines.  UPDATE: Current medications:  ASA 325mg  daily,  atorvastatin 40mg , Lasix  Underwent blepharoplasty in October.  That went well.  She has a follow up appointment in 2 or 3 weeks.  She fell while being chased by her neighbor's dog.  She fractured two toes in her left foot.  She was found to have a DVT again but in her right leg.  Unable to afford Eliquis so she was advised to continue ASA but at 325mg .  Otherwise, doing well  HISTORY: She was diagnosed with "confusional migraines" in the 1970s, where she has severe migraine headache associated with brief confusion lasting 30 minutes. She typically has a couple a year. Initially, she was on nortriptyline for a year but stopped due to side effects.  On the night of 10/19/2018, she woke up with stinging paresthesias involving the left side of her face from forehead, over her left eye and down to the left side of her chin. She also noted moderate left frontotemporal throbbing pain as well as left otalgia, decreased hearing in left ear, blurred vision in left eye, nausea and noted that she was drooling from the corner of both sides of her mouth. She got up and felt dizzy and unsteady on her feet. No slurred speech, speech disturbance, or new unilateral facial droop or extremity weakness. No fevers. She was tested for COVID, which was negative. No specific triggers. She had an MRI of the brain without contrast on 10/29/2018 which was unremarkable. Her PCP thought she may have had a migraine, so she was prescribed topiramate. She stopped after one dose because it didn't make her feel well. Due to the left otalgia and trouble hearing, she saw ENT. Exam was negative.  She had mild residual symptoms everyday up untilfor the next 2 weeks. During that time, she did have one of her typical migraines. She noted confusion off and on over those 3 weeks as well. Typical migraines not associated with left facial paresthesias, drooling and dizziness. Carotid doppler from 11/24/2018 showed only minimal  heterogenous plaque with no hemodynamically significant stenosis.In November 2020, she was driving to the grocery store and then she started having a "fuzzy feeling" on her forehead and she suddenly became confused and was disoriented. She didn't know where she was. She had to ask her passenger where to go. It lasted about 5 minutes. Since then, she has had residual symptoms. Shereported intermittentslurred speech. She has short term memory problems. She states this is different than her prior migraines with confusion.She has left sided neck pain and ear and her orthopedist started her on Mobic which was ineffective. Left eye is still blurred.Sed rate was 24.  MRI of brain w wo and MRA of head were normal.  Echocardiogram from 01/21/2019 showed EF 55-60% with no cardiac source of embolus.  Two week holter monitor from 12/12 to 02/05/2019 showed no a fib.  She was subsequently found to have a DVT so ASA was switched to Eliquis in the meantime.  She saw Dr. Marshall Cork at [facility] on 01/10/2019 was unremarkable. Bilateral ptosis was noted. She was referred for surgery but is on hold while on anticoagulation.  Neuropsychological testing performed by Dr. Hazle Coca on 01/24/2019 was normal and her subjective memory complaints was felt to be secondary to depression and anxiety.  She does have chronic neck pain with cervical spondylosis, which is treated by orthopedics. MRI of cervical spine from 03/27/2018 showed degenerative cervical spondylosis with edematous arthritis at C1-2 on right and C3-4 on the left but no significant foraminal or spinal stenosis. She says it has gradually gotten worse over the past few months.MRI of lumbar spine from same day showed scoliosis as well as multilevel degenerative changes including foraminal narrowing on left that may be affecting left L3 nerve root and right foraminal narrowing at L4-5 and L5-S1, possibly affecting right L5  nerve.  She has history of mitral valve prolapse with slight murmur and palpitations which is monitored by cardiology. Echocardiogram from 02/05/2018 was unremarkable with LV EF of 55-60% and grade 1 diastolic dysfunction. 48 hour Holter monitor in January 2020 reportedly revealed no significant arrhythmias. She was started on metoprolol around that time.   Past migraine preventatives: Nortriptyline, topiramate  Past Medical History: Past Medical History:  Diagnosis Date  . Acute deep vein thrombosis (DVT) of left peroneal vein ([place]) 04/11/2019  . Cervical spondylosis 03/18/2018  . Chronic bilateral low back pain without sciatica 03/18/2018  . Chronic foot pain, left 03/14/2019   Formatting of this note might be different from the original. Possible fracture base 2nd metatarsal  . Chronic neck pain 03/18/2018  . COPD (chronic obstructive pulmonary disease) (HCC)    Dr. Delena Bali  . DDD (degenerative disc disease), cervical 03/18/2018  . DVT femoral (deep venous thrombosis) with thrombophlebitis, left ([place]) 04/11/2019  . Dyspnea on exertion 01/27/2018  . Facet arthritis, degenerative, lumbar spine 03/18/2018  . Family history of malignant neoplasm of breast   . Generalized anxiety disorder 01/24/2019   Possible history of panic attacks  . Greater trochanteric bursitis of both hips 04/10/2018  . Heart murmur 01/27/2018  . History of breast cancer 03/18/2018   S/p bilateral mastectomy  Formatting of this note  might be different from the original. S/p bilateral mastectomy  . History of deep vein thrombosis (DVT) of lower extremity 10/02/2019  . Left leg swelling 02/23/2019  . Lumbar spondylosis 03/18/2018  . Malignant neoplasm of breast (female), unspecified site   . Malignant neoplasm of central portion of left female breast ([place])   . Mitral valve prolapse 01/27/2018  . Obstructive sleep apnea    Variable CPAP use.  . Other idiopathic scoliosis, lumbar region 03/18/2018  . Pain of left calf 03/14/2019   . Palpitations 01/25/2018  . Pes planus of left foot 02/23/2019  . Skin cancer    basal cell  . Sprain of tarsometatarsal ligament of right foot 07/14/2019  . Stasis dermatitis, acute 10/03/2019  . Tenosynovitis of left ankle 04/11/2019  . TIA (transient ischemic attack) 01/17/2019  . Urge incontinence 07/28/2019  . Varicose veins of left lower extremity   . Ventricular tachycardia ([place]) 02/28/2019    Medications: Outpatient Encounter Medications as of 04/18/2020  Medication Sig  . ascorbic acid (VITAMIN C) 500 MG tablet Take by mouth.  Marland Kitchen aspirin EC 81 MG tablet Take 81 mg by mouth daily. Swallow whole.  Marland Kitchen atorvastatin (LIPITOR) 40 MG tablet Take 40 mg by mouth daily.   . calcium carbonate (OSCAL) 1500 (600 Ca) MG TABS tablet Take 1 tablet by mouth daily.  . calcium-vitamin D (OSCAL WITH D) 250-125 MG-UNIT tablet Take 1 tablet by mouth daily.  . Cholecalciferol 25 MCG (1000 UT) capsule Take by mouth.  . dicyclomine (BENTYL) 10 MG capsule Take by mouth.  . diphenoxylate-atropine (LOMOTIL) 2.5-0.025 MG tablet Take by mouth.  . ergocalciferol (VITAMIN D2) 50000 units capsule Take 50,000 Units by mouth once a week.  . furosemide (LASIX) 40 MG tablet Take by mouth.  . meloxicam (MOBIC) 7.5 MG tablet Take 1 tablet by mouth daily.  . metoprolol succinate (TOPROL-XL) 25 MG 24 hr tablet TAKE 1 TABLET BY MOUTH EVERY DAY  . montelukast (SINGULAIR) 10 MG tablet Take by mouth.  . nitrofurantoin (MACRODANTIN) 100 MG capsule Take 100 mg by mouth at bedtime.  . ondansetron (ZOFRAN) 4 MG tablet Take 4 mg by mouth every 8 (eight) hours as needed for nausea or vomiting.  . Potassium Chloride ER 20 MEQ TBCR Take 1 tablet by mouth daily.  . solifenacin (VESICARE) 10 MG tablet Take 1 tablet by mouth daily.  . tamsulosin (FLOMAX) 0.4 MG CAPS capsule Take 0.8 mg by mouth daily. Take 2 - 0.4mg  tablets daily  . Tiotropium Bromide-Olodaterol (STIOLTO RESPIMAT) 2.5-2.5 MCG/ACT AERS Inhale 2 puffs into the lungs 1 day  or 1 dose.  . triamcinolone cream (KENALOG) 0.5 % Apply topically.  . triamcinolone ointment (KENALOG) 0.5 % Apply 1 application topically 3 (three) times daily as needed.  . vitamin B-12 (CYANOCOBALAMIN) 1000 MCG tablet Take 1,000 mcg by mouth daily.   No facility-administered encounter medications on file as of 04/18/2020.    Allergies: Allergies  Allergen Reactions  . Clarithromycin Diarrhea and Other (See Comments)  . Prednisone Other (See Comments)    Passed out  . Sertraline Diarrhea  . Sertraline Hcl Diarrhea    Family History: Family History  Problem Relation Age of Onset  . Lymphoma Mother 60       deceased 79  . Breast cancer Maternal Aunt        Dx 71s; Deceased 52s  . Lung cancer Maternal Uncle   . Stomach cancer Paternal Uncle        deceased 35  .  Liver cancer Maternal Uncle   . Prostate cancer Other        3 mat cousins  . Breast cancer Other 46       mother's mat half-sister  . Emphysema Father     Observations/Objective:   There were no vitals taken for this visit. No acute distress.  Alert and oriented.  Speech fluent and not dysarthric.  Language intact.     Follow Up Instructions:    -I discussed the assessment and treatment plan with the patient. The patient was provided an opportunity to ask questions and all were answered. The patient agreed with the plan and demonstrated an understanding of the instructions.   The patient was advised to call back or seek an in-person evaluation if the symptoms worsen or if the condition fails to improve as anticipated.   Dudley Major, DO

## 2020-04-23 ENCOUNTER — Telehealth: Payer: Medicare Other | Admitting: Neurology

## 2020-05-07 DIAGNOSIS — M7062 Trochanteric bursitis, left hip: Secondary | ICD-10-CM | POA: Diagnosis not present

## 2020-05-07 DIAGNOSIS — M7061 Trochanteric bursitis, right hip: Secondary | ICD-10-CM | POA: Diagnosis not present

## 2020-05-21 DIAGNOSIS — L821 Other seborrheic keratosis: Secondary | ICD-10-CM | POA: Diagnosis not present

## 2020-05-21 DIAGNOSIS — L570 Actinic keratosis: Secondary | ICD-10-CM | POA: Diagnosis not present

## 2020-05-21 DIAGNOSIS — L820 Inflamed seborrheic keratosis: Secondary | ICD-10-CM | POA: Diagnosis not present

## 2020-05-31 ENCOUNTER — Ambulatory Visit (INDEPENDENT_AMBULATORY_CARE_PROVIDER_SITE_OTHER): Payer: Medicare Other | Admitting: Cardiology

## 2020-05-31 ENCOUNTER — Other Ambulatory Visit: Payer: Self-pay

## 2020-05-31 ENCOUNTER — Encounter: Payer: Self-pay | Admitting: Cardiology

## 2020-05-31 VITALS — BP 92/50 | HR 80 | Ht 66.0 in | Wt 179.0 lb

## 2020-05-31 DIAGNOSIS — R0609 Other forms of dyspnea: Secondary | ICD-10-CM

## 2020-05-31 DIAGNOSIS — Z86718 Personal history of other venous thrombosis and embolism: Secondary | ICD-10-CM | POA: Diagnosis not present

## 2020-05-31 DIAGNOSIS — R0600 Dyspnea, unspecified: Secondary | ICD-10-CM

## 2020-05-31 DIAGNOSIS — I472 Ventricular tachycardia, unspecified: Secondary | ICD-10-CM

## 2020-05-31 DIAGNOSIS — R002 Palpitations: Secondary | ICD-10-CM | POA: Diagnosis not present

## 2020-05-31 NOTE — Patient Instructions (Signed)
Medication Instructions:  Your physician recommends that you continue on your current medications as directed. Please refer to the Current Medication list given to you today.  *If you need a refill on your cardiac medications before your next appointment, please call your pharmacy*   Lab Work: None If you have labs (blood work) drawn today and your tests are completely normal, you will receive your results only by: Marland Kitchen MyChart Message (if you have MyChart) OR . A paper copy in the mail If you have any lab test that is abnormal or we need to change your treatment, we will call you to review the results.   Testing/Procedures: Your physician has requested that you have an echocardiogram. Echocardiography is a painless test that uses sound waves to create images of your heart. It provides your doctor with information about the size and shape of your heart and how well your heart's chambers and valves are working. This procedure takes approximately one hour. There are no restrictions for this procedure.  Your physician has requested that you have a lower or upper extremity venous duplex. This test is an ultrasound of the veins in the legs or arms. It looks at venous blood flow that carries blood from the heart to the legs or arms. Allow one hour for a Lower Venous exam. Allow thirty minutes for an Upper Venous exam. There are no restrictions or special instructions.      Follow-Up: At [facility], you and your health needs are our priority.  As part of our continuing mission to provide you with exceptional heart care, we have created designated Provider Care Teams.  These Care Teams include your primary Cardiologist (physician) and Advanced Practice Providers (APPs -  Physician Assistants and Nurse Practitioners) who all work together to provide you with the care you need, when you need it.  We recommend signing up for the patient portal called "MyChart".  Sign up information is provided on  this After Visit Summary.  MyChart is used to connect with patients for Virtual Visits (Telemedicine).  Patients are able to view lab/test results, encounter notes, upcoming appointments, etc.  Non-urgent messages can be sent to your provider as well.   To learn more about what you can do with MyChart, go to [email or web address].    Your next appointment:   6 month(s)  The format for your next appointment:   In Person  Provider:   Jenne Campus, MD   Other Instructions   Echocardiogram An echocardiogram is a test that uses sound waves (ultrasound) to produce images of the heart. Images from an echocardiogram can provide important information about:  Heart size and shape.  The size and thickness and movement of your heart's walls.  Heart muscle function and strength.  Heart valve function or if you have stenosis. Stenosis is when the heart valves are too narrow.  If blood is flowing backward through the heart valves (regurgitation).  A tumor or infectious growth around the heart valves.  Areas of heart muscle that are not working well because of poor blood flow or injury from a heart attack.  Aneurysm detection. An aneurysm is a weak or damaged part of an artery wall. The wall bulges out from the normal force of blood pumping through the body. Tell a health care provider about:  Any allergies you have.  All medicines you are taking, including vitamins, herbs, eye drops, creams, and over-the-counter medicines.  Any blood disorders you have.  Any surgeries you have had.  Any medical conditions you have.  Whether you are pregnant or may be pregnant. What are the risks? Generally, this is a safe test. However, problems may occur, including an allergic reaction to dye (contrast) that may be used during the test. What happens before the test? No specific preparation is needed. You may eat and drink normally. What happens during the test?  You will take off your  clothes from the waist up and put on a hospital gown.  Electrodes or electrocardiogram (ECG)patches may be placed on your chest. The electrodes or patches are then connected to a device that monitors your heart rate and rhythm.  You will lie down on a table for an ultrasound exam. A gel will be applied to your chest to help sound waves pass through your skin.  A handheld device, called a transducer, will be pressed against your chest and moved over your heart. The transducer produces sound waves that travel to your heart and bounce back (or "echo" back) to the transducer. These sound waves will be captured in real-time and changed into images of your heart that can be viewed on a video monitor. The images will be recorded on a computer and reviewed by your health care provider.  You may be asked to change positions or hold your breath for a short time. This makes it easier to get different views or better views of your heart.  In some cases, you may receive contrast through an IV in one of your veins. This can improve the quality of the pictures from your heart. The procedure may vary among health care providers and hospitals.   What can I expect after the test? You may return to your normal, everyday life, including diet, activities, and medicines, unless your health care provider tells you not to do that. Follow these instructions at home:  It is up to you to get the results of your test. Ask your health care provider, or the department that is doing the test, when your results will be ready.  Keep all follow-up visits. This is important. Summary  An echocardiogram is a test that uses sound waves (ultrasound) to produce images of the heart.  Images from an echocardiogram can provide important information about the size and shape of your heart, heart muscle function, heart valve function, and other possible heart problems.  You do not need to do anything to prepare before this test. You may  eat and drink normally.  After the echocardiogram is completed, you may return to your normal, everyday life, unless your health care provider tells you not to do that. This information is not intended to replace advice given to you by your health care provider. Make sure you discuss any questions you have with your health care provider. Document Revised: 09/20/2019 Document Reviewed: 09/20/2019 Elsevier Patient Education  2021 [employer].

## 2020-05-31 NOTE — Progress Notes (Signed)
Cardiology Office Note:    Date:  05/31/2020   ID:  Karen Shannon, DOB 1950-06-04, MRN [id number]  PCP:  Nicoletta Dress, MD  Cardiologist:  Jenne Campus, MD    Referring MD: Nicoletta Dress, MD   Chief Complaint  Patient presents with  . Follow-up  I am doing fine  History of Present Illness:    Karen Shannon is a 70 y.o. female with past medical history significant for DVT in the left lower extremities which was provoked, essential hypertension, nonsustained ventricular tachycardia, systolic heart murmur.  She is coming today to my office for follow-up.  Overall she is doing well but few weeks ago she fell down she end up having some swelling in the right calf apparently DVT study was done which was positive she could not afford Eliquis and she was given just aspirin.  Since that time like it looks much better there is not much pain and no tenderness.  She denies having dizziness passing out.  She walks with no difficulties her blood pressure slightly low today which make me concerned.  Past Medical History:  Diagnosis Date  . Acute deep vein thrombosis (DVT) of left peroneal vein ([place]) 04/11/2019  . Cervical spondylosis 03/18/2018  . Chronic bilateral low back pain without sciatica 03/18/2018  . Chronic foot pain, left 03/14/2019   Formatting of this note might be different from the original. Possible fracture base 2nd metatarsal  . Chronic neck pain 03/18/2018  . COPD (chronic obstructive pulmonary disease) (H[facility])    Dr. Delena Bali  . DDD (degenerative disc disease), cervical 03/18/2018  . Disp fx of fifth metatarsal bone, r foot, init for opn fx   . DVT (deep venous thrombosis) ([place]) 03/2020  . DVT femoral (deep venous thrombosis) with thrombophlebitis, left ([place]) 04/11/2019  . Dyspnea on exertion 01/27/2018  . Facet arthritis, degenerative, lumbar spine 03/18/2018  . Family history of malignant neoplasm of breast   . Generalized anxiety disorder 01/24/2019   Possible  history of panic attacks  . Greater trochanteric bursitis of both hips 04/10/2018  . Heart murmur 01/27/2018  . History of breast cancer 03/18/2018   S/p bilateral mastectomy  Formatting of this note might be different from the original. S/p bilateral mastectomy  . History of deep vein thrombosis (DVT) of lower extremity 10/02/2019  . Left leg swelling 02/23/2019  . Lumbar spondylosis 03/18/2018  . Malignant neoplasm of breast (female), unspecified site   . Malignant neoplasm of central portion of left female breast ([place])   . Mitral valve prolapse 01/27/2018  . Obstructive sleep apnea    Variable CPAP use.  . Other idiopathic scoliosis, lumbar region 03/18/2018  . Pain of left calf 03/14/2019  . Palpitations 01/25/2018  . Pes planus of left foot 02/23/2019  . Skin cancer    basal cell  . Sprain of tarsometatarsal ligament of right foot 07/14/2019  . Stasis dermatitis, acute 10/03/2019  . Tenosynovitis of left ankle 04/11/2019  . TIA (transient ischemic attack) 01/17/2019  . Urge incontinence 07/28/2019  . Varicose veins of left lower extremity   . Ventricular tachycardia ([place]) 02/28/2019    Past Surgical History:  Procedure Laterality Date  . BUNIONECTOMY Right 1999  . MASTECTOMY Bilateral 2012  . NASAL SEPTUM SURGERY  1996  . SKIN CANCER EXCISION  2018   Top of head   . SKIN CANCER EXCISION  07/2019   Nose  . TENDON TRANSFER Right 1999   foot    Current  Medications: Current Meds  Medication Sig  . ascorbic acid (VITAMIN C) 500 MG tablet Take 500 mg by mouth daily.  Marland Kitchen aspirin 325 MG tablet Take 325 mg by mouth daily.  Marland Kitchen atorvastatin (LIPITOR) 40 MG tablet Take 40 mg by mouth daily.   . Cholecalciferol 25 MCG (1000 UT) capsule Take 1,000 Units by mouth daily.  Marland Kitchen dicyclomine (BENTYL) 10 MG capsule Take 10 mg by mouth as needed (IBS).  Marland Kitchen diphenoxylate-atropine (LOMOTIL) 2.5-0.025 MG tablet Take 1 tablet by mouth as needed for diarrhea or loose stools.  . ergocalciferol (VITAMIN D2) 50000  units capsule Take 50,000 Units by mouth once a week.  . furosemide (LASIX) 40 MG tablet Take 40 mg by mouth daily.  . metoprolol succinate (TOPROL-XL) 25 MG 24 hr tablet TAKE 1 TABLET BY MOUTH EVERY DAY (Patient taking differently: Take 25 mg by mouth daily.)  . montelukast (SINGULAIR) 10 MG tablet Take 10 mg by mouth at bedtime.  . ondansetron (ZOFRAN) 4 MG tablet Take 4 mg by mouth every 8 (eight) hours as needed for nausea or vomiting.  . Potassium Chloride ER 20 MEQ TBCR Take 1 tablet by mouth daily.  . Tiotropium Bromide-Olodaterol 2.5-2.5 MCG/ACT AERS Inhale 2 puffs into the lungs 1 day or 1 dose.  . vitamin B-12 (CYANOCOBALAMIN) 1000 MCG tablet Take 1,000 mcg by mouth daily.  . [DISCONTINUED] nitrofurantoin (MACRODANTIN) 100 MG capsule Take 100 mg by mouth at bedtime.     Allergies:   Clarithromycin, Prednisone, Sertraline, and Sertraline hcl   Social History   Socioeconomic History  . Marital status: Single    Spouse name: Not on file  . Number of children: 0  . Years of education: 16  . Highest education level: Bachelor's degree (e.g., BA, AB, BS)  Occupational History  . Not on file  Tobacco Use  . Smoking status: Never Smoker  . Smokeless tobacco: Never Used  Vaping Use  . Vaping Use: Never used  Substance and Sexual Activity  . Alcohol use: Not Currently  . Drug use: Never  . Sexual activity: Not on file  Other Topics Concern  . Not on file  Social History Narrative   Pt is single she lives alone has no children   Right handed   Social Determinants of Health   Financial Resource Strain: Not on file  Food Insecurity: Not on file  Transportation Needs: Not on file  Physical Activity: Not on file  Stress: Not on file  Social Connections: Not on file     Family History: [name] history includes Breast cancer in her maternal aunt; Breast cancer (age of onset: 25) in an other family member; Emphysema in her father; Liver cancer in her maternal  uncle; Lung cancer in her maternal uncle; Lymphoma (age of onset: 16) in her mother; Prostate cancer in an other family member; Stomach cancer in her paternal uncle. ROS:   Please see the history of present illness.    All 14 point review of systems negative except as described per history of present illness  EKGs/Labs/Other Studies Reviewed:      Recent Labs: No results found for requested labs within last 8760 hours.  Recent Lipid Panel    Component Value Date/Time   CHOL 174 02/28/2019 1447   TRIG 83 02/28/2019 1447   HDL 57 02/28/2019 1447   CHOLHDL 3.1 02/28/2019 1447   LDLCALC 102 (H) 02/28/2019 1447    Physical Exam:    VS:  BP (!) 92/50 (  BP Location: Right Arm, Patient Position: Sitting)   Pulse 80   Ht 5\' 6"  (1.676 m)   Wt 179 lb (81.2 kg)   SpO2 98%   BMI 28.89 kg/m     Wt Readings from Last 3 Encounters:  05/31/20 179 lb (81.2 kg)  12/22/19 186 lb 9.6 oz (84.6 kg)  11/01/19 182 lb (82.6 kg)     GEN:  Well nourished, well developed in no acute distress HEENT: Normal NECK: No JVD; No carotid bruits LYMPHATICS: No lymphadenopathy CARDIAC: RRR, systolic ejection murmur grade 2/6 best heard right upper portion of the sternum, no rubs, no gallops RESPIRATORY:  Clear to auscultation without rales, wheezing or rhonchi  ABDOMEN: Soft, non-tender, non-distended MUSCULOSKELETAL:  No edema; No deformity  SKIN: Warm and dry LOWER EXTREMITIES: no swelling NEUROLOGIC:  Alert and oriented x 3 PSYCHIATRIC:  Normal affect   ASSESSMENT:    1. Ventricular tachycardia ([place])   2. History of deep vein thrombosis (DVT) of lower extremity   3. Palpitations   4. Dyspnea on exertion    PLAN:    In order of problems listed above:  1. Ventricular tachycardia patient completely asymptomatic no dizziness no passing out.  Stress test echocardiogram negative. 2. History of deep vein thrombosis which was provoked and she is not anticoagulated however recently she also got  posttraumatic right DVT she was not able to afford Eliquis I will schedule her to have repeated ultrasounds of the lower extremities make sure that DVT dissolved. 3. Palpitations denies having any. 4. Dyspnea on exertion will get echocardiogram to assess left ventricle ejection fraction. 5. Dyslipidemia her LDL is 84 HDL 56 this is data from April 14, 2020.  We will continue present management.  This data came from [facility] that I review for this visit   Medication Adjustments/Labs and Tests Ordered: Current medicines are reviewed at length with the patient today.  Concerns regarding medicines are outlined above.  No orders of the defined types were placed in this encounter.  Medication changes: No orders of the defined types were placed in this encounter.   Signed, Park Liter, MD, [facility] 05/31/2020 2:35 PM    [facility]

## 2020-05-31 NOTE — Addendum Note (Signed)
Addended by: Senaida Ores on: 05/31/2020 02:44 PM   Modules accepted: Orders

## 2020-06-01 ENCOUNTER — Telehealth: Payer: Self-pay

## 2020-06-01 DIAGNOSIS — M79604 Pain in right leg: Secondary | ICD-10-CM | POA: Diagnosis not present

## 2020-06-01 DIAGNOSIS — Z86718 Personal history of other venous thrombosis and embolism: Secondary | ICD-10-CM | POA: Diagnosis not present

## 2020-06-01 DIAGNOSIS — M79605 Pain in left leg: Secondary | ICD-10-CM | POA: Diagnosis not present

## 2020-06-01 DIAGNOSIS — R600 Localized edema: Secondary | ICD-10-CM | POA: Diagnosis not present

## 2020-06-01 NOTE — Telephone Encounter (Signed)
Megan from [facility] called to clarify the site needing ultrasound. Per Dr. Agustin Cree notes will recheck ultrasound of lower extremities. I clarified to proceed bilaterally.

## 2020-06-20 ENCOUNTER — Encounter: Payer: Self-pay | Admitting: Hematology and Oncology

## 2020-06-20 ENCOUNTER — Other Ambulatory Visit: Payer: Self-pay

## 2020-06-20 ENCOUNTER — Inpatient Hospital Stay: Payer: Medicare Other | Attending: Hematology and Oncology | Admitting: Hematology and Oncology

## 2020-06-20 ENCOUNTER — Telehealth: Payer: Self-pay | Admitting: Hematology and Oncology

## 2020-06-20 VITALS — BP 133/71 | HR 69 | Temp 98.0°F | Resp 18 | Ht 66.0 in | Wt 181.1 lb

## 2020-06-20 DIAGNOSIS — Z171 Estrogen receptor negative status [ER-]: Secondary | ICD-10-CM

## 2020-06-20 DIAGNOSIS — C50112 Malignant neoplasm of central portion of left female breast: Secondary | ICD-10-CM | POA: Diagnosis not present

## 2020-06-20 NOTE — Telephone Encounter (Signed)
Per 5/11 los next appt scheduled and given to patient 

## 2020-06-20 NOTE — Progress Notes (Signed)
[place]  [address] [place],  [place]  [id number] [phone]  Clinic Day:  06/21/2020  Referring physician: Nicoletta Dress, MD   CHIEF COMPLAINT:  CC:   Stage I A triple negative breast cancer  Current Treatment:   Observation    HISTORY OF PRESENT ILLNESS:  Karen Shannon is a 70 y.o. female with a history of\ stage IA (T1b N0 M0) triple negative left breast cancer diagnosed in January 2012.  She underwent bilateral mastectomies at her request.  Pathology revealed a 9 mm, grade 1, invasive ductal carcinoma with negative nodes.  Estrogen and progesterone receptors were negative and her 2 Neu negative.  Ki 67 was 9%.  She was placed on observation alone.  She was referred to genetic counselor at [facility] due to her triple negative disease, as well as family history of malignancy.  She underwent testing with the [facility] Next Panel in 2015.  This did not reveal any clinically significant mutation. 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  She reported 2 strokes since September 2020. She described transient ischemic attacks in September and October 2020. She states Dr. Tomi Likens did not do the MRI/MRA head, as the sed rate was normal.  She is followed by by Dr. Metta Clines, a neurologist, at [facility].  She stated she was scheduled for an MRI/MRA head at [facility], as well as a cognitive test in January 2021.  We were concerned about temporal arteritis, but a sed rate was normal.  She was found to have hypokalemia, so we placed her on potassium 20 mEq daily.  She also complained of nausea after eating and intermittent sharp, shooting abdominal pain.  We gave her ondansetron 4 mg every 4 hours as needed for the nausea.  CT abdomen and pelvis did not reveal any acute abnormality. Colon diverticulosis without diverticulitis was noted.   She told us she had decreased memory on her cognitive test and he plans to repeat that yearly.    She  was found to have a deep venous thrombus of the left leg in at [facility] in February 2021, for which she was placed on apixaban 5 mg twice daily and was given samples. She could not afford apixaban, so was placed on aspirin 81 mg daily once the thrombus resolved resolved. She has a history of mitral valve prolapse and murmur, for which she sees Dr. Agustin Cree.  She states she wore a heart monitor for 2 weeks in February 2021, which apparently revealed irregular rhythm. She had a stress test stress test in his office, which was felt to be normal.  She also had ultrasounds of the carotid arteries, which were normal. Bone density scan in July 2021 revealed osteopenia. She had eyelid surgery in October 2021. She undergoes routine lab work at Dr. Denton Lank office.  INTERVAL HISTORY:  Karen Shannon is here today for repeat clinical assessment. Since her last visit, she had a fall with injury to her right leg and was found to have a deep venous thrombosis in March.  The aspirin was increased to 325 mg daily due to this  She saw Dr. Agustin Cree in April and bilateral lower extremity venous ultrasound did not reveal any evidence of deep venous thrombosis, so she states she is now taking aspirin 77m daily again. She denies any changes in her mastectomy sites.  She states she is only using ondansetron as needed and denies continuous nausea. She denies fevers or chills. She  denies pain. Her appetite is good. She has been walking 1-1/2 miles daily and losing weight. Her weight has decreased 5 pounds over last 6 months. She continues potassium as prescribed.  She states she takes vitamin-D, but not calcium, so I asked her to discuss the this with Dr. Delena Bali. She states she had a precancerous lesion removed from her scalp.  REVIEW OF SYSTEMS:  Review of Systems  Constitutional: Negative for appetite change, chills, fatigue, fever and unexpected weight change.  HENT:   Negative for lump/mass, mouth sores and sore throat.    Respiratory: Positive for shortness of breath (Chronic, stable due to COPD). Negative for cough.   Cardiovascular: Negative for chest pain and leg swelling.  Gastrointestinal: Negative for abdominal pain, constipation, diarrhea, nausea and vomiting.  Endocrine: Negative for hot flashes.  Genitourinary: Negative for difficulty urinating, dysuria, frequency and hematuria.   Musculoskeletal: Negative for arthralgias, back pain and myalgias.  Skin: Negative for rash.  Neurological: Negative for dizziness and headaches.  Hematological: Negative for adenopathy. Does not bruise/bleed easily.  Psychiatric/Behavioral: Negative for depression and sleep disturbance. The patient is not nervous/anxious.     VITALS:  Blood pressure 133/71, pulse 69, temperature 98 F (36.7 C), temperature source Oral, resp. rate 18, height _0  (1.676 m), weight 181 lb 1.6 oz (82.1 kg), SpO2 100 %.  Wt Readings from Last 3 Encounters:  06/20/20 181 lb 1.6 oz (82.1 kg)  05/31/20 179 lb (81.2 kg)  12/22/19 186 lb 9.6 oz (84.6 kg)    Body mass index is 29.23 kg/m.  Performance status (ECOG): 0 - Asymptomatic  PHYSICAL EXAM:  Physical Exam Vitals and nursing note reviewed.  Constitutional:      General: She is not in acute distress.    Appearance: Normal appearance.  HENT:     Head: Normocephalic and atraumatic.     Mouth/Throat:     Mouth: Mucous membranes are moist.     Pharynx: Oropharynx is clear. No oropharyngeal exudate or posterior oropharyngeal erythema.  Eyes:     General: No scleral icterus.    Extraocular Movements: Extraocular movements intact.     Conjunctiva/sclera: Conjunctivae normal.     Pupils: Pupils are equal, round, and reactive to light.  Cardiovascular:     Rate and Rhythm: Normal rate and regular rhythm.     Heart sounds: Normal heart sounds. No murmur heard. No friction rub. No gallop.   Pulmonary:     Effort: Pulmonary effort is normal.     Breath sounds: Normal breath sounds.  No wheezing, rhonchi or rales.  Chest:  Breasts:     Right: Absent. No axillary adenopathy or supraclavicular adenopathy.     Left: Absent. No axillary adenopathy or supraclavicular adenopathy.      Comments:  Bilateral mastectomy sites are negative Abdominal:     General: There is no distension.     Palpations: Abdomen is soft. There is no hepatomegaly, splenomegaly or mass.     Tenderness: There is no abdominal tenderness.  Musculoskeletal:        General: Normal range of motion.     Cervical back: Normal range of motion and neck supple. No tenderness.     Right lower leg: No edema.     Left lower leg: No edema.  Lymphadenopathy:     Cervical: No cervical adenopathy.     Upper Body:     Right upper body: No supraclavicular or axillary adenopathy.     Left upper body: No supraclavicular  or axillary adenopathy.     Lower Body: No right inguinal adenopathy. No left inguinal adenopathy.  Skin:    General: Skin is warm and dry.     Coloration: Skin is not jaundiced.     Findings: No rash.  Neurological:     Mental Status: She is alert and oriented to person, place, and time.     Cranial Nerves: No cranial nerve deficit.  Psychiatric:        Mood and Affect: Mood normal.        Behavior: Behavior normal.        Thought Content: Thought content normal.    LABS:  No flowsheet data found. No flowsheet data found.   No results found for: CEA1 / No results found for: CEA1 No results found for: PSA1 No results found for: GOT157 No results found for: CAN125  No results found for: TOTALPROTELP, ALBUMINELP, A1GS, A2GS, BETS, BETA2SER, GAMS, MSPIKE, SPEI No results found for: TIBC, FERRITIN, IRONPCTSAT No results found for: LDH  STUDIES:  No results found.  Exam(s): [id number] US/US EXTREM LOW VENOUS-BILAT CLINICAL DATA:  Bilateral leg pain and edema  EXAM: BILATERAL LOWER EXTREMITY VENOUS DOPPLER ULTRASOUND  TECHNIQUE: Gray-scale sonography with graded compression, as well  as color Doppler and duplex ultrasound were performed to evaluate the lower extremity deep venous systems from the level of the common femoral vein and including the common femoral, femoral, profunda femoral, popliteal and calf veins including the posterior tibial, peroneal and gastrocnemius veins when visible. The superficial great saphenous vein was also interrogated. Spectral Doppler was utilized to evaluate flow at rest and with distal augmentation maneuvers in the common femoral, femoral and popliteal veins.  COMPARISON:  None.  FINDINGS: RIGHT LOWER EXTREMITY  Common Femoral Vein: No evidence of thrombus. Normal compressibility, respiratory phasicity and response to augmentation.  Saphenofemoral Junction: No evidence of thrombus. Normal compressibility and flow on color Doppler imaging.  Profunda Femoral Vein: No evidence of thrombus. Normal compressibility and flow on color Doppler imaging.  Femoral Vein: No evidence of thrombus. Normal compressibility, respiratory phasicity and response to augmentation.  Popliteal Vein: No evidence of thrombus. Normal compressibility, respiratory phasicity and response to augmentation.  Calf Veins: No evidence of thrombus. Normal compressibility and flow on color Doppler imaging.  LEFT LOWER EXTREMITY  Common Femoral Vein: No evidence of thrombus. Normal compressibility, respiratory phasicity and response to augmentation.  Saphenofemoral Junction: No evidence of thrombus. Normal compressibility and flow on color Doppler imaging.  Profunda Femoral Vein: No evidence of thrombus. Normal compressibility and flow on color Doppler imaging.  Femoral Vein: No evidence of thrombus. Normal compressibility, respiratory phasicity and response to augmentation.  Popliteal Vein: No evidence of thrombus. Normal compressibility, respiratory phasicity and response to augmentation.  Calf Veins: No evidence of thrombus. Normal compressibility  and flow on color Doppler imaging.  IMPRESSION: No evidence of deep venous thrombosis in either lower extremity.   HISTORY:   Past Medical History:  Diagnosis Date  . Acute deep vein thrombosis (DVT) of left peroneal vein ([place]) 04/11/2019  . Cervical spondylosis 03/18/2018  . Chronic bilateral low back pain without sciatica 03/18/2018  . Chronic foot pain, left 03/14/2019   Formatting of this note might be different from the original. Possible fracture base 2nd metatarsal  . Chronic neck pain 03/18/2018  . COPD (chronic obstructive pulmonary disease) ([facility])    Dr. Delena Bali  . DDD (degenerative disc disease), cervical 03/18/2018  . Disp fx of fifth metatarsal bone, r foot,  init for opn fx   . DVT (deep venous thrombosis) ([place]) 03/2020  . DVT femoral (deep venous thrombosis) with thrombophlebitis, left ([place]) 04/11/2019  . Dyspnea on exertion 01/27/2018  . Facet arthritis, degenerative, lumbar spine 03/18/2018  . Family history of malignant neoplasm of breast   . Generalized anxiety disorder 01/24/2019   Possible history of panic attacks  . Greater trochanteric bursitis of both hips 04/10/2018  . Heart murmur 01/27/2018  . History of breast cancer 03/18/2018   S/p bilateral mastectomy  Formatting of this note might be different from the original. S/p bilateral mastectomy  . History of deep vein thrombosis (DVT) of lower extremity 10/02/2019  . Left leg swelling 02/23/2019  . Lumbar spondylosis 03/18/2018  . Malignant neoplasm of breast (female), unspecified site   . Malignant neoplasm of central portion of left female breast ([place])   . Mitral valve prolapse 01/27/2018  . Obstructive sleep apnea    Variable CPAP use.  . Other idiopathic scoliosis, lumbar region 03/18/2018  . Pain of left calf 03/14/2019  . Palpitations 01/25/2018  . Pes planus of left foot 02/23/2019  . Skin cancer    basal cell  . Sprain of tarsometatarsal ligament of right foot 07/14/2019  . Stasis dermatitis, acute 10/03/2019  .  Tenosynovitis of left ankle 04/11/2019  . TIA (transient ischemic attack) 01/17/2019  . Urge incontinence 07/28/2019  . Varicose veins of left lower extremity   . Ventricular tachycardia ([place]) 02/28/2019    Past Surgical History:  Procedure Laterality Date  . BUNIONECTOMY Right 1999  . MASTECTOMY Bilateral 2012  . NASAL SEPTUM SURGERY  1996  . SKIN CANCER EXCISION  2018   Top of head   . SKIN CANCER EXCISION  07/2019   Nose  . TENDON TRANSFER Right 1999   foot    Family History  Problem Relation Age of Onset  . Lymphoma Mother 47       deceased 63  . Breast cancer Maternal Aunt        Dx 67s; Deceased 24s  . Lung cancer Maternal Uncle   . Stomach cancer Paternal Uncle        deceased 39  . Liver cancer Maternal Uncle   . Prostate cancer Other        3 mat cousins  . Breast cancer Other 10       mother's mat half-sister  . Emphysema Father     Social History:  reports that she has never smoked. She has never used smokeless tobacco. She reports previous alcohol use. She reports that she does not use drugs.The patient is alone today.  Allergies:  Allergies  Allergen Reactions  . Clarithromycin Diarrhea and Other (See Comments)  . Prednisone Other (See Comments)    Passed out  . Sertraline Diarrhea  . Sertraline Hcl Diarrhea    Current Medications: Current Outpatient Medications  Medication Sig Dispense Refill  . ascorbic acid (VITAMIN C) 500 MG tablet Take 500 mg by mouth daily.    Marland Kitchen aspirin 325 MG tablet Take 325 mg by mouth daily.    Marland Kitchen atorvastatin (LIPITOR) 40 MG tablet Take 40 mg by mouth daily.     . Cholecalciferol 25 MCG (1000 UT) capsule Take 1,000 Units by mouth daily.    Marland Kitchen dicyclomine (BENTYL) 10 MG capsule Take 10 mg by mouth as needed (IBS).    Marland Kitchen diphenoxylate-atropine (LOMOTIL) 2.5-0.025 MG tablet Take 1 tablet by mouth as needed for diarrhea or loose stools.    Marland Kitchen  ergocalciferol (VITAMIN D2) 50000 units capsule Take 50,000 Units by mouth once a week.     . furosemide (LASIX) 40 MG tablet Take 40 mg by mouth daily.    . metoprolol succinate (TOPROL-XL) 25 MG 24 hr tablet TAKE 1 TABLET BY MOUTH EVERY DAY (Patient taking differently: Take 25 mg by mouth daily.) 90 tablet 2  . montelukast (SINGULAIR) 10 MG tablet Take 10 mg by mouth at bedtime.    . ondansetron (ZOFRAN) 4 MG tablet Take 4 mg by mouth every 8 (eight) hours as needed for nausea or vomiting.    . Potassium Chloride ER 20 MEQ TBCR Take 1 tablet by mouth daily.    . Tiotropium Bromide-Olodaterol 2.5-2.5 MCG/ACT AERS Inhale 2 puffs into the lungs 1 day or 1 dose.    . vitamin B-12 (CYANOCOBALAMIN) 1000 MCG tablet Take 1,000 mcg by mouth daily.     No current facility-administered medications for this visit.     ASSESSMENT & PLAN:   Assessment:   1. History of stage I triple negative breast cancer, status post bilateral mastectomy. She remains without evidence of recurrence. Genetic testing in 2015 was negative.  I reviewed her case with the genetic counselor  And further testing is not recommended based on her personal family history.  2. History of transient ischemic attacks.  She denies further episodes  She continues to follow with Dr. Tomi Likens.  3. Nausea after eating with intermittent abdominal pain.  She is only using ondansetron as needed now.    4. Deep venous thrombosis of the left lower extremity in May 2021.  5. Provoked deep venous thrombosis of the right lower extremity in February 2022, for which she continues aspirin  81 mg daily daily.  Plan:    We will plan to see her back in 6 months for re-examination, as she remains very uncomfortable switching to annual visits.  The patient understands the plans discussed today and is in agreement with them.  She knows to contact our office if she develops concerns prior to her next appointment.     Marvia Pickles, PA-C

## 2020-06-22 ENCOUNTER — Telehealth: Payer: Self-pay

## 2020-06-22 NOTE — Telephone Encounter (Signed)
-----   Message from Marvia Pickles, PA-C sent at 06/21/2020  8:07 AM EDT ----- Please let her know that the genetic counselor felt the testing she had already was what she needed based on her and her family's history. Thanks!

## 2020-06-25 ENCOUNTER — Telehealth: Payer: Self-pay

## 2020-06-25 NOTE — Telephone Encounter (Addendum)
I spoke with Karen Shannon, and notified her of below. She verbalized understanding. ----- Message from Marvia Pickles, PA-C sent at 06/25/2020 12:36 PM EDT ----- Regarding: RE: RNA test panel Contact: [phone] I sent a message to someone to call her regarding my discussion with the genetic counselor. Based on her personal and family history of cancer, as well as previous genetic testing, she would not recommend additional genetic testing. Thanks! ----- Message ----- From: Dairl Ponder, RN Sent: 06/25/2020  11:55 AM EDT To: Marvia Pickles, PA-C Subject: RNA test panel                                 Karen Shannon states she saw you last week. She thinks she may have missed a call from you regarding the RNA test panel results?

## 2020-06-29 ENCOUNTER — Other Ambulatory Visit: Payer: Medicare Other

## 2020-07-14 DIAGNOSIS — E559 Vitamin D deficiency, unspecified: Secondary | ICD-10-CM | POA: Diagnosis not present

## 2020-07-14 DIAGNOSIS — M19071 Primary osteoarthritis, right ankle and foot: Secondary | ICD-10-CM | POA: Diagnosis not present

## 2020-07-14 DIAGNOSIS — Z139 Encounter for screening, unspecified: Secondary | ICD-10-CM | POA: Diagnosis not present

## 2020-07-14 DIAGNOSIS — M160 Bilateral primary osteoarthritis of hip: Secondary | ICD-10-CM | POA: Diagnosis not present

## 2020-07-14 DIAGNOSIS — E0590 Thyrotoxicosis, unspecified without thyrotoxic crisis or storm: Secondary | ICD-10-CM | POA: Diagnosis not present

## 2020-07-14 DIAGNOSIS — E785 Hyperlipidemia, unspecified: Secondary | ICD-10-CM | POA: Diagnosis not present

## 2020-07-14 DIAGNOSIS — R413 Other amnesia: Secondary | ICD-10-CM | POA: Diagnosis not present

## 2020-07-14 DIAGNOSIS — M19072 Primary osteoarthritis, left ankle and foot: Secondary | ICD-10-CM | POA: Diagnosis not present

## 2020-07-14 DIAGNOSIS — J4540 Moderate persistent asthma, uncomplicated: Secondary | ICD-10-CM | POA: Diagnosis not present

## 2020-07-14 DIAGNOSIS — Z86718 Personal history of other venous thrombosis and embolism: Secondary | ICD-10-CM | POA: Diagnosis not present

## 2020-07-14 DIAGNOSIS — R002 Palpitations: Secondary | ICD-10-CM | POA: Diagnosis not present

## 2020-07-14 DIAGNOSIS — Z6830 Body mass index (BMI) 30.0-30.9, adult: Secondary | ICD-10-CM | POA: Diagnosis not present

## 2020-07-16 DIAGNOSIS — E559 Vitamin D deficiency, unspecified: Secondary | ICD-10-CM | POA: Diagnosis not present

## 2020-07-16 DIAGNOSIS — R413 Other amnesia: Secondary | ICD-10-CM | POA: Diagnosis not present

## 2020-07-16 DIAGNOSIS — Z6830 Body mass index (BMI) 30.0-30.9, adult: Secondary | ICD-10-CM | POA: Diagnosis not present

## 2020-07-19 DIAGNOSIS — R413 Other amnesia: Secondary | ICD-10-CM | POA: Diagnosis not present

## 2020-07-23 ENCOUNTER — Ambulatory Visit (INDEPENDENT_AMBULATORY_CARE_PROVIDER_SITE_OTHER): Payer: Medicare Other

## 2020-07-23 ENCOUNTER — Other Ambulatory Visit: Payer: Self-pay

## 2020-07-23 DIAGNOSIS — R0600 Dyspnea, unspecified: Secondary | ICD-10-CM | POA: Diagnosis not present

## 2020-07-23 DIAGNOSIS — I472 Ventricular tachycardia, unspecified: Secondary | ICD-10-CM

## 2020-07-23 DIAGNOSIS — R002 Palpitations: Secondary | ICD-10-CM | POA: Diagnosis not present

## 2020-07-23 DIAGNOSIS — R0609 Other forms of dyspnea: Secondary | ICD-10-CM

## 2020-07-23 LAB — ECHOCARDIOGRAM COMPLETE
AR max vel: 1.13 cm2
AV Area VTI: 1.16 cm2
AV Area mean vel: 1.02 cm2
AV Mean grad: 14 mmHg
AV Peak grad: 22.4 mmHg
Ao pk vel: 2.37 m/s
Area-P 1/2: 4.21 cm2
S' Lateral: 3.1 cm

## 2020-07-23 NOTE — Progress Notes (Signed)
Complete echocardiogram performed.  Jimmy Angellica Maddison RDCS, RVT  

## 2020-07-24 ENCOUNTER — Telehealth: Payer: Self-pay | Admitting: Emergency Medicine

## 2020-07-24 NOTE — Telephone Encounter (Signed)
Patient wants Dr. Agustin Cree to know she has had a stroke. She was informed yesterday.

## 2020-08-04 DIAGNOSIS — R221 Localized swelling, mass and lump, neck: Secondary | ICD-10-CM | POA: Diagnosis not present

## 2020-08-04 DIAGNOSIS — R413 Other amnesia: Secondary | ICD-10-CM | POA: Diagnosis not present

## 2020-08-04 DIAGNOSIS — I6990 Unspecified sequelae of unspecified cerebrovascular disease: Secondary | ICD-10-CM | POA: Diagnosis not present

## 2020-08-08 DIAGNOSIS — I6990 Unspecified sequelae of unspecified cerebrovascular disease: Secondary | ICD-10-CM | POA: Diagnosis not present

## 2020-08-08 DIAGNOSIS — R221 Localized swelling, mass and lump, neck: Secondary | ICD-10-CM | POA: Diagnosis not present

## 2020-08-08 DIAGNOSIS — I6523 Occlusion and stenosis of bilateral carotid arteries: Secondary | ICD-10-CM | POA: Diagnosis not present

## 2020-08-21 DIAGNOSIS — H43811 Vitreous degeneration, right eye: Secondary | ICD-10-CM | POA: Diagnosis not present

## 2020-08-21 DIAGNOSIS — H40013 Open angle with borderline findings, low risk, bilateral: Secondary | ICD-10-CM | POA: Diagnosis not present

## 2020-08-28 DIAGNOSIS — E041 Nontoxic single thyroid nodule: Secondary | ICD-10-CM | POA: Diagnosis not present

## 2020-08-28 DIAGNOSIS — E079 Disorder of thyroid, unspecified: Secondary | ICD-10-CM | POA: Diagnosis not present

## 2020-08-28 DIAGNOSIS — M47812 Spondylosis without myelopathy or radiculopathy, cervical region: Secondary | ICD-10-CM | POA: Diagnosis not present

## 2020-08-29 ENCOUNTER — Encounter: Payer: Self-pay | Admitting: Vascular Surgery

## 2020-08-29 ENCOUNTER — Ambulatory Visit (INDEPENDENT_AMBULATORY_CARE_PROVIDER_SITE_OTHER): Payer: Medicare Other | Admitting: Vascular Surgery

## 2020-08-29 ENCOUNTER — Other Ambulatory Visit: Payer: Self-pay

## 2020-08-29 VITALS — BP 112/74 | HR 72 | Temp 98.0°F | Resp 14 | Ht 66.0 in | Wt 175.6 lb

## 2020-08-29 DIAGNOSIS — I6522 Occlusion and stenosis of left carotid artery: Secondary | ICD-10-CM

## 2020-08-29 NOTE — Progress Notes (Signed)
ASSESSMENT & PLAN   LEFT CAROTID STENOSIS: This patient has an asymptomatic 50 to 69% left carotid stenosis.  There is no significant stenosis on the right.  She had a right brain stroke but has no carotid stenosis on the right.  She is on aspirin and is on a statin.  I explained that we would not consider left carotid endarterectomy unless the stenosis progressed to greater than 80% or she develop new left hemispheric symptoms.  She knows to continue her aspirin and statin.  She is not a smoker.  I have ordered a follow-up carotid duplex scan in 6 months and I will see her back at that time.  She knows to call sooner if she has problems.  REASON FOR CONSULT:    Carotid stenosis.  The consult is requested by Dr. Nelda Bucks.    HPI:   Karen Shannon is a 70 y.o. female who was referred with carotid disease.  I have reviewed the records from the referring office.  The patient has a history of a CVA.  They had a carotid duplex scan done on 08/08/2020.  This showed no significant carotid stenosis on the right with a 50 to 69% stenosis on the left.  I reviewed the office note from 08/04/2020.  The patient was complaining of some memory impairment.  This has been gradual in onset and been going on intermittently for the last 2 years.  On my history, the patient states that she had a TIA on October 19, 2018 which was associated with some headaches and paresthesias on the left side of her face.  She had another TIA in November 2020.  In June 2022 she says that she had a stroke.  Reportedly CT scan showed a stroke in the right brain and she has had some left hand tingling since the stroke.  I do not get any history of right upper extremity or right lower extremity weakness or paresthesias.  She is had no expressive or receptive aphasia or amaurosis fugax.  She is on aspirin and is on a statin.  She is not a smoker.  She does have COPD.  Past Medical History:  Diagnosis Date   Acute deep vein  thrombosis (DVT) of left peroneal vein (HCC) 04/11/2019   Cervical spondylosis 03/18/2018   Chronic bilateral low back pain without sciatica 03/18/2018   Chronic foot pain, left 03/14/2019   Formatting of this note might be different from the original. Possible fracture base 2nd metatarsal   Chronic neck pain 03/18/2018   COPD (chronic obstructive pulmonary disease) ([place])    Dr. Delena Bali   DDD (degenerative disc disease), cervical 03/18/2018   Disp fx of fifth metatarsal bone, r foot, init for opn fx    DVT (deep venous thrombosis) ([place]) 03/2020   DVT femoral (deep venous thrombosis) with thrombophlebitis, left ([place]) 04/11/2019   Dyspnea on exertion 01/27/2018   Facet arthritis, degenerative, lumbar spine 03/18/2018   Family history of malignant neoplasm of breast    Generalized anxiety disorder 01/24/2019   Possible history of panic attacks   Greater trochanteric bursitis of both hips 04/10/2018   Heart murmur 01/27/2018   History of breast cancer 03/18/2018   S/p bilateral mastectomy  Formatting of this note might be different from the original. S/p bilateral mastectomy   History of deep vein thrombosis (DVT) of lower extremity 10/02/2019   Left leg swelling 02/23/2019   Lumbar spondylosis 03/18/2018   Malignant neoplasm of breast (female), unspecified site  Malignant neoplasm of central portion of left female breast ([place])    Mitral valve prolapse 01/27/2018   Obstructive sleep apnea    Variable CPAP use.   Other idiopathic scoliosis, lumbar region 03/18/2018   Pain of left calf 03/14/2019   Palpitations 01/25/2018   Pes planus of left foot 02/23/2019   Skin cancer    basal cell   Sprain of tarsometatarsal ligament of right foot 07/14/2019   Stasis dermatitis, acute 10/03/2019   Tenosynovitis of left ankle 04/11/2019   TIA (transient ischemic attack) 01/17/2019   Urge incontinence 07/28/2019   Varicose veins of left lower extremity    Ventricular tachycardia ([place]) 02/28/2019    Family History  Problem  Relation Age of Onset   Lymphoma Mother 16       deceased 56   Breast cancer Maternal Aunt        Dx 58s; Deceased 70s   Lung cancer Maternal Uncle    Stomach cancer Paternal Uncle        deceased 61   Liver cancer Maternal Uncle    Prostate cancer Other        3 mat cousins   Breast cancer Other 36       mother's mat half-sister   Emphysema Father     SOCIAL HISTORY: Social History   Tobacco Use   Smoking status: Never   Smokeless tobacco: Never  Substance Use Topics   Alcohol use: Not Currently    Allergies  Allergen Reactions   Clarithromycin Diarrhea and Other (See Comments)   Prednisone Other (See Comments)    Passed out   Sertraline Diarrhea   Sertraline Hcl Diarrhea    Current Outpatient Medications  Medication Sig Dispense Refill   ascorbic acid (VITAMIN C) 500 MG tablet Take 500 mg by mouth daily.     aspirin 81 MG chewable tablet Chew 81 mg by mouth daily.     atorvastatin (LIPITOR) 40 MG tablet Take 40 mg by mouth daily.      Cholecalciferol 25 MCG (1000 UT) capsule Take 1,000 Units by mouth daily.     dicyclomine (BENTYL) 10 MG capsule Take 10 mg by mouth as needed (IBS).     diphenoxylate-atropine (LOMOTIL) 2.5-0.025 MG tablet Take 1 tablet by mouth as needed for diarrhea or loose stools.     donepezil (ARICEPT) 5 MG tablet Take 5 mg by mouth at bedtime.     ergocalciferol (VITAMIN D2) 50000 units capsule Take 50,000 Units by mouth once a week.     furosemide (LASIX) 40 MG tablet Take 40 mg by mouth daily.     metoprolol succinate (TOPROL-XL) 25 MG 24 hr tablet TAKE 1 TABLET BY MOUTH EVERY DAY (Patient taking differently: Take 25 mg by mouth daily.) 90 tablet 2   montelukast (SINGULAIR) 10 MG tablet Take 10 mg by mouth at bedtime.     ondansetron (ZOFRAN) 4 MG tablet Take 4 mg by mouth every 8 (eight) hours as needed for nausea or vomiting.     Tiotropium Bromide-Olodaterol 2.5-2.5 MCG/ACT AERS Inhale 2 puffs into the lungs 1 day or 1 dose.     vitamin  B-12 (CYANOCOBALAMIN) 1000 MCG tablet Take 1,000 mcg by mouth daily.     aspirin 325 MG tablet Take 325 mg by mouth daily.     Potassium Chloride ER 20 MEQ TBCR Take 1 tablet by mouth daily.     No current facility-administered medications for this visit.    REVIEW OF SYSTEMS:  [  X] denotes positive finding, [ ]  denotes negative finding Cardiac  Comments:  Chest pain or chest pressure:    Shortness of breath upon exertion:    Short of breath when lying flat:    Irregular heart rhythm:        Vascular    Pain in calf, thigh, or hip brought on by ambulation:    Pain in feet at night that wakes you up from your sleep:     Blood clot in your veins: x   Leg swelling:  x       Pulmonary    Oxygen at home:    Productive cough:     Wheezing:         Neurologic    Sudden weakness in arms or legs:     Sudden numbness in arms or legs:  x Left upper extremity  Sudden onset of difficulty speaking or slurred speech:    Temporary loss of vision in one eye:     Problems with dizziness:  x       Gastrointestinal    Blood in stool:     Vomited blood:         Genitourinary    Burning when urinating:     Blood in urine:        Psychiatric    Major depression:         Hematologic    Bleeding problems:    Problems with blood clotting too easily:        Skin    Rashes or ulcers:        Constitutional    Fever or chills:    -  PHYSICAL EXAM:   Vitals:   08/29/20 1442  BP: 112/74  Pulse: 72  Resp: 14  Temp: 98 F (36.7 C)  TempSrc: Temporal  SpO2: 98%  Weight: 175 lb 9.6 oz (79.7 kg)  Height: 5\' 6"  (1.676 m)   Body mass index is 28.34 kg/m. GENERAL: The patient is a well-nourished female, in no acute distress. The vital signs are documented above. CARDIAC: There is a regular rate and rhythm.  VASCULAR: I do not detect carotid bruits. She has mild bilateral lower extremity swelling. PULMONARY: There is good air exchange bilaterally without wheezing or rales. ABDOMEN:  Soft and non-tender with normal pitched bowel sounds.  MUSCULOSKELETAL: There are no major deformities. NEUROLOGIC: No focal weakness or paresthesias are detected. SKIN: There are no ulcers or rashes noted. PSYCHIATRIC: The patient has a normal affect.  DATA:    CAROTID DUPLEX: I did review the carotid duplex scan that was done on 08/08/2020.  This showed no significant carotid stenosis on the right.  The right vertebral artery had antegrade flow.  On the left side there was a 50 to 69% stenosis.  The left vertebral artery had antegrade flow.  Deitra Mayo [facility]

## 2020-08-31 ENCOUNTER — Other Ambulatory Visit: Payer: Self-pay

## 2020-08-31 DIAGNOSIS — I6522 Occlusion and stenosis of left carotid artery: Secondary | ICD-10-CM

## 2020-09-01 DIAGNOSIS — R339 Retention of urine, unspecified: Secondary | ICD-10-CM | POA: Diagnosis not present

## 2020-09-01 DIAGNOSIS — I6522 Occlusion and stenosis of left carotid artery: Secondary | ICD-10-CM | POA: Diagnosis not present

## 2020-09-01 DIAGNOSIS — R413 Other amnesia: Secondary | ICD-10-CM | POA: Diagnosis not present

## 2020-09-01 DIAGNOSIS — E041 Nontoxic single thyroid nodule: Secondary | ICD-10-CM | POA: Diagnosis not present

## 2020-09-01 DIAGNOSIS — E785 Hyperlipidemia, unspecified: Secondary | ICD-10-CM | POA: Diagnosis not present

## 2020-09-01 DIAGNOSIS — Z6830 Body mass index (BMI) 30.0-30.9, adult: Secondary | ICD-10-CM | POA: Diagnosis not present

## 2020-09-01 DIAGNOSIS — R3989 Other symptoms and signs involving the genitourinary system: Secondary | ICD-10-CM | POA: Diagnosis not present

## 2020-09-12 DIAGNOSIS — G4733 Obstructive sleep apnea (adult) (pediatric): Secondary | ICD-10-CM | POA: Diagnosis not present

## 2020-09-12 DIAGNOSIS — J4540 Moderate persistent asthma, uncomplicated: Secondary | ICD-10-CM | POA: Diagnosis not present

## 2020-09-12 DIAGNOSIS — R5383 Other fatigue: Secondary | ICD-10-CM | POA: Diagnosis not present

## 2020-09-17 DIAGNOSIS — H43812 Vitreous degeneration, left eye: Secondary | ICD-10-CM | POA: Diagnosis not present

## 2020-09-19 DIAGNOSIS — N390 Urinary tract infection, site not specified: Secondary | ICD-10-CM | POA: Diagnosis not present

## 2020-09-19 DIAGNOSIS — R339 Retention of urine, unspecified: Secondary | ICD-10-CM | POA: Diagnosis not present

## 2020-09-19 DIAGNOSIS — R319 Hematuria, unspecified: Secondary | ICD-10-CM | POA: Diagnosis not present

## 2020-09-26 DIAGNOSIS — R5383 Other fatigue: Secondary | ICD-10-CM | POA: Diagnosis not present

## 2020-09-26 DIAGNOSIS — G4733 Obstructive sleep apnea (adult) (pediatric): Secondary | ICD-10-CM | POA: Diagnosis not present

## 2020-09-26 DIAGNOSIS — J4540 Moderate persistent asthma, uncomplicated: Secondary | ICD-10-CM | POA: Diagnosis not present

## 2020-09-29 DIAGNOSIS — Z6830 Body mass index (BMI) 30.0-30.9, adult: Secondary | ICD-10-CM | POA: Diagnosis not present

## 2020-09-29 DIAGNOSIS — N3091 Cystitis, unspecified with hematuria: Secondary | ICD-10-CM | POA: Diagnosis not present

## 2020-09-29 DIAGNOSIS — R413 Other amnesia: Secondary | ICD-10-CM | POA: Diagnosis not present

## 2020-10-01 DIAGNOSIS — E669 Obesity, unspecified: Secondary | ICD-10-CM | POA: Diagnosis not present

## 2020-10-01 DIAGNOSIS — Z1331 Encounter for screening for depression: Secondary | ICD-10-CM | POA: Diagnosis not present

## 2020-10-01 DIAGNOSIS — Z6830 Body mass index (BMI) 30.0-30.9, adult: Secondary | ICD-10-CM | POA: Diagnosis not present

## 2020-10-01 DIAGNOSIS — Z9181 History of falling: Secondary | ICD-10-CM | POA: Diagnosis not present

## 2020-10-01 DIAGNOSIS — E785 Hyperlipidemia, unspecified: Secondary | ICD-10-CM | POA: Diagnosis not present

## 2020-10-01 DIAGNOSIS — Z0000 Encounter for general adult medical examination without abnormal findings: Secondary | ICD-10-CM | POA: Diagnosis not present

## 2020-10-11 DIAGNOSIS — M5030 Other cervical disc degeneration, unspecified cervical region: Secondary | ICD-10-CM | POA: Diagnosis not present

## 2020-10-11 DIAGNOSIS — M7061 Trochanteric bursitis, right hip: Secondary | ICD-10-CM | POA: Diagnosis not present

## 2020-10-11 DIAGNOSIS — M5136 Other intervertebral disc degeneration, lumbar region: Secondary | ICD-10-CM | POA: Diagnosis not present

## 2020-10-11 DIAGNOSIS — I639 Cerebral infarction, unspecified: Secondary | ICD-10-CM | POA: Diagnosis not present

## 2020-10-11 DIAGNOSIS — M7062 Trochanteric bursitis, left hip: Secondary | ICD-10-CM | POA: Diagnosis not present

## 2020-10-15 DIAGNOSIS — G4733 Obstructive sleep apnea (adult) (pediatric): Secondary | ICD-10-CM | POA: Diagnosis not present

## 2020-10-16 DIAGNOSIS — I6529 Occlusion and stenosis of unspecified carotid artery: Secondary | ICD-10-CM | POA: Diagnosis not present

## 2020-10-16 DIAGNOSIS — Z9989 Dependence on other enabling machines and devices: Secondary | ICD-10-CM | POA: Diagnosis not present

## 2020-10-16 DIAGNOSIS — Z8673 Personal history of transient ischemic attack (TIA), and cerebral infarction without residual deficits: Secondary | ICD-10-CM | POA: Diagnosis not present

## 2020-10-16 DIAGNOSIS — G4733 Obstructive sleep apnea (adult) (pediatric): Secondary | ICD-10-CM | POA: Diagnosis not present

## 2020-10-16 DIAGNOSIS — J3489 Other specified disorders of nose and nasal sinuses: Secondary | ICD-10-CM | POA: Diagnosis not present

## 2020-10-16 DIAGNOSIS — E669 Obesity, unspecified: Secondary | ICD-10-CM | POA: Diagnosis not present

## 2020-10-16 DIAGNOSIS — J342 Deviated nasal septum: Secondary | ICD-10-CM | POA: Diagnosis not present

## 2020-10-16 DIAGNOSIS — R221 Localized swelling, mass and lump, neck: Secondary | ICD-10-CM | POA: Diagnosis not present

## 2020-10-16 DIAGNOSIS — J45909 Unspecified asthma, uncomplicated: Secondary | ICD-10-CM | POA: Diagnosis not present

## 2020-10-16 DIAGNOSIS — I10 Essential (primary) hypertension: Secondary | ICD-10-CM | POA: Diagnosis not present

## 2020-10-16 DIAGNOSIS — Q279 Congenital malformation of peripheral vascular system, unspecified: Secondary | ICD-10-CM | POA: Diagnosis not present

## 2020-10-18 DIAGNOSIS — N3281 Overactive bladder: Secondary | ICD-10-CM | POA: Diagnosis not present

## 2020-10-18 DIAGNOSIS — R351 Nocturia: Secondary | ICD-10-CM | POA: Diagnosis not present

## 2020-10-23 DIAGNOSIS — M5136 Other intervertebral disc degeneration, lumbar region: Secondary | ICD-10-CM | POA: Diagnosis not present

## 2020-10-23 DIAGNOSIS — M545 Low back pain, unspecified: Secondary | ICD-10-CM | POA: Diagnosis not present

## 2020-10-23 DIAGNOSIS — R2681 Unsteadiness on feet: Secondary | ICD-10-CM | POA: Diagnosis not present

## 2020-10-23 DIAGNOSIS — R531 Weakness: Secondary | ICD-10-CM | POA: Diagnosis not present

## 2020-10-23 DIAGNOSIS — M5030 Other cervical disc degeneration, unspecified cervical region: Secondary | ICD-10-CM | POA: Diagnosis not present

## 2020-10-23 DIAGNOSIS — R293 Abnormal posture: Secondary | ICD-10-CM | POA: Diagnosis not present

## 2020-10-23 DIAGNOSIS — R2689 Other abnormalities of gait and mobility: Secondary | ICD-10-CM | POA: Diagnosis not present

## 2020-10-24 DIAGNOSIS — J45901 Unspecified asthma with (acute) exacerbation: Secondary | ICD-10-CM | POA: Diagnosis not present

## 2020-10-26 ENCOUNTER — Other Ambulatory Visit: Payer: Self-pay

## 2020-10-29 ENCOUNTER — Ambulatory Visit (INDEPENDENT_AMBULATORY_CARE_PROVIDER_SITE_OTHER): Payer: Medicare Other | Admitting: Cardiology

## 2020-10-29 ENCOUNTER — Other Ambulatory Visit: Payer: Self-pay

## 2020-10-29 ENCOUNTER — Encounter: Payer: Self-pay | Admitting: Cardiology

## 2020-10-29 VITALS — BP 114/74 | HR 83 | Ht 64.0 in | Wt 171.2 lb

## 2020-10-29 DIAGNOSIS — I472 Ventricular tachycardia, unspecified: Secondary | ICD-10-CM

## 2020-10-29 DIAGNOSIS — G4733 Obstructive sleep apnea (adult) (pediatric): Secondary | ICD-10-CM | POA: Diagnosis not present

## 2020-10-29 DIAGNOSIS — R002 Palpitations: Secondary | ICD-10-CM | POA: Diagnosis not present

## 2020-10-29 DIAGNOSIS — I739 Peripheral vascular disease, unspecified: Secondary | ICD-10-CM

## 2020-10-29 DIAGNOSIS — I6930 Unspecified sequelae of cerebral infarction: Secondary | ICD-10-CM

## 2020-10-29 DIAGNOSIS — R0600 Dyspnea, unspecified: Secondary | ICD-10-CM | POA: Diagnosis not present

## 2020-10-29 DIAGNOSIS — E785 Hyperlipidemia, unspecified: Secondary | ICD-10-CM

## 2020-10-29 DIAGNOSIS — R0609 Other forms of dyspnea: Secondary | ICD-10-CM

## 2020-10-29 DIAGNOSIS — I341 Nonrheumatic mitral (valve) prolapse: Secondary | ICD-10-CM | POA: Diagnosis not present

## 2020-10-29 HISTORY — DX: Peripheral vascular disease, unspecified: I73.9

## 2020-10-29 LAB — LDL CHOLESTEROL, DIRECT: LDL Direct: 89 mg/dL (ref 0–99)

## 2020-10-29 NOTE — Patient Instructions (Signed)
Medication Instructions:  Your physician recommends that you continue on your current medications as directed. Please refer to the Current Medication list given to you today.  *If you need a refill on your cardiac medications before your next appointment, please call your pharmacy*   Lab Work: Your physician recommends that you return for lab work in:  TODAY: Direct LDL If you have labs (blood work) drawn today and your tests are completely normal, you will receive your results only by: [place] (if you have MyChart) OR A paper copy in the mail If you have any lab test that is abnormal or we need to change your treatment, we will call you to review the results.   Testing/Procedures: None   Follow-Up: At [facility], you and your health needs are our priority.  As part of our continuing mission to provide you with exceptional heart care, we have created designated Provider Care Teams.  These Care Teams include your primary Cardiologist (physician) and Advanced Practice Providers (APPs -  Physician Assistants and Nurse Practitioners) who all work together to provide you with the care you need, when you need it.  We recommend signing up for the patient portal called "MyChart".  Sign up information is provided on this After Visit Summary.  MyChart is used to connect with patients for Virtual Visits (Telemedicine).  Patients are able to view lab/test results, encounter notes, upcoming appointments, etc.  Non-urgent messages can be sent to your provider as well.   To learn more about what you can do with MyChart, go to [email or web address].    Your next appointment:   5 month(s)  The format for your next appointment:   In Person  Provider:   Jenne Campus, MD   Other Instructions

## 2020-10-29 NOTE — Addendum Note (Signed)
Addended by: Orvan July on: 10/29/2020 10:34 AM   Modules accepted: Orders

## 2020-10-29 NOTE — Addendum Note (Signed)
Addended by: Darrel Reach on: 10/29/2020 11:53 AM   Modules accepted: Orders

## 2020-10-29 NOTE — Progress Notes (Signed)
Cardiology Office Note:    Date:  10/29/2020   ID:  Karen Shannon, DOB 1950/12/09, MRN [id number]  PCP:  Nicoletta Dress, MD  Cardiologist:  Jenne Campus, MD    Referring MD: Nicoletta Dress, MD   Chief Complaint  Patient presents with   Follow-up    Had a stoke on 07/11/2020/ 69% blockage carotid artery on the l side     History of Present Illness:    Karen Shannon is a 70 y.o. female past medical history significant for DVT in the left lower extremity which was provoked, essential hypertension, nonsustained ventricular tachycardia, so far echocardiogram showed no evidence of cardiomyopathy stress test showed no evidence of ischemia.  Also recently in June she ended going to the hospital where she was find to have lacunar stroke on the right side of her brain she also got carotic ultrasounds which showed up to 69% stenosis on the left side.  She did visit with vascular surgeon who very appropriately told her there is no indication for surgery.  She is being treated conservatively.  Since that time she has been doing well.  She denies some tingling on the left side of her arm.  She also participating in physical therapy to improve her balance and stability while walking.  Denies have any chest pain tightness squeezing pressure burning chest.  There is no passing out.  No palpitations.  Past Medical History:  Diagnosis Date   Acute deep vein thrombosis (DVT) of left peroneal vein (HCC) 04/11/2019   Alzheimer's dementia ([place])    Cervical spondylosis 03/18/2018   Chronic bilateral low back pain without sciatica 03/18/2018   Chronic foot pain, left 03/14/2019   Formatting of this note might be different from the original. Possible fracture base 2nd metatarsal   Chronic neck pain 03/18/2018   COPD (chronic obstructive pulmonary disease) ([place])    Dr. Delena Bali   DDD (degenerative disc disease), cervical 03/18/2018   Disp fx of fifth metatarsal bone, r foot, init for opn  fx    DVT (deep venous thrombosis) ([place]) 03/2020   DVT femoral (deep venous thrombosis) with thrombophlebitis, left ([place]) 04/11/2019   Dyspnea on exertion 01/27/2018   Facet arthritis, degenerative, lumbar spine 03/18/2018   Family history of malignant neoplasm of breast    Generalized anxiety disorder 01/24/2019   Possible history of panic attacks   Greater trochanteric bursitis of both hips 04/10/2018   Heart murmur 01/27/2018   History of breast cancer 03/18/2018   S/p bilateral mastectomy  Formatting of this note might be different from the original. S/p bilateral mastectomy   History of deep vein thrombosis (DVT) of lower extremity 10/02/2019   Left leg swelling 02/23/2019   Lumbar spondylosis 03/18/2018   Malignant neoplasm of breast (female), unspecified site    Malignant neoplasm of central portion of left female breast ([place])    Mitral valve prolapse 01/27/2018   Obstructive sleep apnea    Variable CPAP use.   Other idiopathic scoliosis, lumbar region 03/18/2018   Pain of left calf 03/14/2019   Palpitations 01/25/2018   Pes planus of left foot 02/23/2019   Skin cancer    basal cell   Sprain of tarsometatarsal ligament of right foot 07/14/2019   Stasis dermatitis, acute 10/03/2019   Tenosynovitis of left ankle 04/11/2019   TIA (transient ischemic attack) 01/17/2019   Urge incontinence 07/28/2019   Varicose veins of left lower extremity    Ventricular tachycardia ([place]) 02/28/2019  Past Surgical History:  Procedure Laterality Date   BUNIONECTOMY Right 1999   MASTECTOMY Bilateral 2012   NASAL SEPTUM SURGERY  1996   SKIN CANCER EXCISION  2018   Top of head    SKIN CANCER EXCISION  07/2019   Nose   TENDON TRANSFER Right 1999   foot    Current Medications: Current Meds  Medication Sig   amoxicillin (AMOXIL) 500 MG tablet Take 500 mg by mouth in the morning, at noon, and at bedtime.   ascorbic acid (VITAMIN C) 500 MG tablet Take 500 mg by mouth daily.   aspirin  81 MG chewable tablet Chew 81 mg by mouth daily.   atorvastatin (LIPITOR) 40 MG tablet Take 40 mg by mouth daily.    Cholecalciferol 25 MCG (1000 UT) capsule Take 1,000 Units by mouth daily.   dicyclomine (BENTYL) 10 MG capsule Take 10 mg by mouth as needed for spasms (IBS).   diphenoxylate-atropine (LOMOTIL) 2.5-0.025 MG tablet Take 1 tablet by mouth as needed for diarrhea or loose stools.   donepezil (ARICEPT) 5 MG tablet Take 5 mg by mouth at bedtime.   ergocalciferol (VITAMIN D2) 50000 units capsule Take 50,000 Units by mouth once a week.   furosemide (LASIX) 40 MG tablet Take 40 mg by mouth daily.   memantine (NAMENDA) 5 MG tablet Take 5 mg by mouth 2 (two) times daily.   metoprolol succinate (TOPROL-XL) 25 MG 24 hr tablet TAKE 1 TABLET BY MOUTH EVERY DAY (Patient taking differently: Take 25 mg by mouth daily.)   montelukast (SINGULAIR) 10 MG tablet Take 10 mg by mouth at bedtime.   ondansetron (ZOFRAN) 4 MG tablet Take 4 mg by mouth every 8 (eight) hours as needed for nausea or vomiting.   Tiotropium Bromide-Olodaterol 2.5-2.5 MCG/ACT AERS Inhale 2 puffs into the lungs 1 day or 1 dose.   vitamin B-12 (CYANOCOBALAMIN) 1000 MCG tablet Take 1,000 mcg by mouth daily.     Allergies:   Clarithromycin, Prednisone, Sertraline, and Sertraline hcl   Social History   Socioeconomic History   Marital status: Single    Spouse name: Not on file   Number of children: 0   Years of education: 16   Highest education level: Bachelor's degree (e.g., BA, AB, BS)  Occupational History   Not on file  Tobacco Use   Smoking status: Never   Smokeless tobacco: Never  Vaping Use   Vaping Use: Never used  Substance and Sexual Activity   Alcohol use: Not Currently   Drug use: Never   Sexual activity: Not on file  Other Topics Concern   Not on file  Social History Narrative   Pt is single she lives alone has no children   Right handed   Social Determinants of Health   Financial Resource Strain:  Not on file  Food Insecurity: Not on file  Transportation Needs: Not on file  Physical Activity: Not on file  Stress: Not on file  Social Connections: Not on file     Family History: [name] history includes Breast cancer in her maternal aunt; Breast cancer (age of onset: 47) in an other family member; Emphysema in her father; Liver cancer in her maternal uncle; Lung cancer in her maternal uncle; Lymphoma (age of onset: 47) in her mother; Prostate cancer in an other family member; Stomach cancer in her paternal uncle. ROS:   Please see the history of present illness.    All 14 point review of systems negative except  as described per history of present illness  EKGs/Labs/Other Studies Reviewed:      Recent Labs: No results found for requested labs within last 8760 hours.  Recent Lipid Panel    Component Value Date/Time   CHOL 174 02/28/2019 1447   TRIG 83 02/28/2019 1447   HDL 57 02/28/2019 1447   CHOLHDL 3.1 02/28/2019 1447   LDLCALC 102 (H) 02/28/2019 1447    Physical Exam:    VS:  BP 114/74 (BP Location: Right Arm, Patient Position: Sitting)   Pulse 83   Ht '5\' 4"'$  (1.626 m)   Wt 171 lb 3.2 oz (77.7 kg)   SpO2 94%   BMI 29.39 kg/m     Wt Readings from Last 3 Encounters:  10/29/20 171 lb 3.2 oz (77.7 kg)  08/29/20 175 lb 9.6 oz (79.7 kg)  06/20/20 181 lb 1.6 oz (82.1 kg)     GEN:  Well nourished, well developed in no acute distress HEENT: Normal NECK: No JVD; No carotid bruits LYMPHATICS: No lymphadenopathy CARDIAC: RRR, no murmurs, no rubs, no gallops RESPIRATORY:  Clear to auscultation without rales, wheezing or rhonchi  ABDOMEN: Soft, non-tender, non-distended MUSCULOSKELETAL:  No edema; No deformity  SKIN: Warm and dry LOWER EXTREMITIES: no swelling NEUROLOGIC:  Alert and oriented x 3 PSYCHIATRIC:  Normal affect   ASSESSMENT:    1. Ventricular tachycardia ([place])   2. Obstructive sleep apnea   3. Mitral valve prolapse   4. Dyspnea on  exertion   5. Palpitations   6. Peripheral vascular disease, unspecified ([place]) up to 69% stenosis on the lef internal carotic artery based on cardiac ultrasounds from June 2022 t   7. Late effect of cerebrovascular accident (CVA) right basal ganglion noted on MRI in June 2022 felt to be small vessel disease but    PLAN:    In order of problems listed above:  Ventricle tachycardia denies having any symptoms right now.  Work-up previously was negative.  We will continue present medications. Obstructive sleep apnea followed by antimedicine team Dyspnea on exertion seems to be stable but her ability to exercise is limited because of a sequela of CVA that she suffered from Peripheral vascular disease she got up to 69% stenosis in the left internal carotic artery.  She does have already scheduled carotic ultrasounds in February will follow on that.  She is on antiplatelet therapy, she is also on statin. Late effect of CVA.  It looks like description from MRI did chart review showed lacunar stroke.  Therefore it is probably something that happened in situ.  The key management of this problem is antiplatelet therapy blood pressure control diabetes control risk factors modifications including statin. Dyslipidemia I did review her K PN which show me her LDL of 75 HDL 54 this is from June 2022 she is on 40 mg of Lipitor which is considered high intensity statin.  I will check her direct LDL.  The goal is to keep her LDL below 70.  I did review note by vascular surgeon as well as MRI as well as note from hospital visit.   Medication Adjustments/Labs and Tests Ordered: Current medicines are reviewed at length with the patient today.  Concerns regarding medicines are outlined above.  No orders of the defined types were placed in this encounter.  Medication changes: No orders of the defined types were placed in this encounter.   Signed, Park Liter, MD, [facility] 10/29/2020 10:24 AM    [place]

## 2020-10-30 ENCOUNTER — Telehealth: Payer: Self-pay

## 2020-10-30 NOTE — Telephone Encounter (Signed)
Spoke with patient regarding results and recommendation.  Patient verbalizes understanding and is agreeable to plan of care. Advised patient to call back with any issues or concerns.  

## 2020-10-30 NOTE — Telephone Encounter (Signed)
-----   Message from Park Liter, MD sent at 10/30/2020  1:30 PM EDT ----- Cholesterol is acceptable for now

## 2020-11-03 DIAGNOSIS — J4540 Moderate persistent asthma, uncomplicated: Secondary | ICD-10-CM | POA: Diagnosis not present

## 2020-11-03 DIAGNOSIS — R413 Other amnesia: Secondary | ICD-10-CM | POA: Diagnosis not present

## 2020-11-03 DIAGNOSIS — R002 Palpitations: Secondary | ICD-10-CM | POA: Diagnosis not present

## 2020-11-03 DIAGNOSIS — J45901 Unspecified asthma with (acute) exacerbation: Secondary | ICD-10-CM | POA: Diagnosis not present

## 2020-11-03 DIAGNOSIS — E559 Vitamin D deficiency, unspecified: Secondary | ICD-10-CM | POA: Diagnosis not present

## 2020-11-03 DIAGNOSIS — E0590 Thyrotoxicosis, unspecified without thyrotoxic crisis or storm: Secondary | ICD-10-CM | POA: Diagnosis not present

## 2020-11-03 DIAGNOSIS — Z6830 Body mass index (BMI) 30.0-30.9, adult: Secondary | ICD-10-CM | POA: Diagnosis not present

## 2020-11-03 DIAGNOSIS — Z86718 Personal history of other venous thrombosis and embolism: Secondary | ICD-10-CM | POA: Diagnosis not present

## 2020-11-03 DIAGNOSIS — M19072 Primary osteoarthritis, left ankle and foot: Secondary | ICD-10-CM | POA: Diagnosis not present

## 2020-11-03 DIAGNOSIS — E785 Hyperlipidemia, unspecified: Secondary | ICD-10-CM | POA: Diagnosis not present

## 2020-11-03 DIAGNOSIS — M19071 Primary osteoarthritis, right ankle and foot: Secondary | ICD-10-CM | POA: Diagnosis not present

## 2020-11-03 DIAGNOSIS — M160 Bilateral primary osteoarthritis of hip: Secondary | ICD-10-CM | POA: Diagnosis not present

## 2020-11-05 DIAGNOSIS — M5030 Other cervical disc degeneration, unspecified cervical region: Secondary | ICD-10-CM | POA: Diagnosis not present

## 2020-11-05 DIAGNOSIS — R531 Weakness: Secondary | ICD-10-CM | POA: Diagnosis not present

## 2020-11-05 DIAGNOSIS — R2681 Unsteadiness on feet: Secondary | ICD-10-CM | POA: Diagnosis not present

## 2020-11-05 DIAGNOSIS — M5136 Other intervertebral disc degeneration, lumbar region: Secondary | ICD-10-CM | POA: Diagnosis not present

## 2020-11-05 DIAGNOSIS — M545 Low back pain, unspecified: Secondary | ICD-10-CM | POA: Diagnosis not present

## 2020-11-05 DIAGNOSIS — R2689 Other abnormalities of gait and mobility: Secondary | ICD-10-CM | POA: Diagnosis not present

## 2020-11-07 DIAGNOSIS — M545 Low back pain, unspecified: Secondary | ICD-10-CM | POA: Diagnosis not present

## 2020-11-07 DIAGNOSIS — R531 Weakness: Secondary | ICD-10-CM | POA: Diagnosis not present

## 2020-11-07 DIAGNOSIS — R2681 Unsteadiness on feet: Secondary | ICD-10-CM | POA: Diagnosis not present

## 2020-11-07 DIAGNOSIS — M5136 Other intervertebral disc degeneration, lumbar region: Secondary | ICD-10-CM | POA: Diagnosis not present

## 2020-11-07 DIAGNOSIS — R2689 Other abnormalities of gait and mobility: Secondary | ICD-10-CM | POA: Diagnosis not present

## 2020-11-07 DIAGNOSIS — M5030 Other cervical disc degeneration, unspecified cervical region: Secondary | ICD-10-CM | POA: Diagnosis not present

## 2020-11-13 DIAGNOSIS — M545 Low back pain, unspecified: Secondary | ICD-10-CM | POA: Diagnosis not present

## 2020-11-13 DIAGNOSIS — R2681 Unsteadiness on feet: Secondary | ICD-10-CM | POA: Diagnosis not present

## 2020-11-13 DIAGNOSIS — M5136 Other intervertebral disc degeneration, lumbar region: Secondary | ICD-10-CM | POA: Diagnosis not present

## 2020-11-13 DIAGNOSIS — Z8673 Personal history of transient ischemic attack (TIA), and cerebral infarction without residual deficits: Secondary | ICD-10-CM | POA: Diagnosis not present

## 2020-11-13 DIAGNOSIS — M5030 Other cervical disc degeneration, unspecified cervical region: Secondary | ICD-10-CM | POA: Diagnosis not present

## 2020-11-13 DIAGNOSIS — R293 Abnormal posture: Secondary | ICD-10-CM | POA: Diagnosis not present

## 2020-11-14 ENCOUNTER — Encounter: Payer: Self-pay | Admitting: Genetic Counselor

## 2020-11-14 DIAGNOSIS — Z1379 Encounter for other screening for genetic and chromosomal anomalies: Secondary | ICD-10-CM | POA: Insufficient documentation

## 2020-11-14 DIAGNOSIS — J4540 Moderate persistent asthma, uncomplicated: Secondary | ICD-10-CM | POA: Diagnosis not present

## 2020-11-14 DIAGNOSIS — G4733 Obstructive sleep apnea (adult) (pediatric): Secondary | ICD-10-CM | POA: Diagnosis not present

## 2020-11-14 DIAGNOSIS — R5383 Other fatigue: Secondary | ICD-10-CM | POA: Diagnosis not present

## 2020-11-14 HISTORY — DX: Encounter for other screening for genetic and chromosomal anomalies: Z13.79

## 2020-11-21 DIAGNOSIS — M5030 Other cervical disc degeneration, unspecified cervical region: Secondary | ICD-10-CM | POA: Diagnosis not present

## 2020-11-21 DIAGNOSIS — R2681 Unsteadiness on feet: Secondary | ICD-10-CM | POA: Diagnosis not present

## 2020-11-21 DIAGNOSIS — Z23 Encounter for immunization: Secondary | ICD-10-CM | POA: Diagnosis not present

## 2020-11-21 DIAGNOSIS — M545 Low back pain, unspecified: Secondary | ICD-10-CM | POA: Diagnosis not present

## 2020-11-21 DIAGNOSIS — R293 Abnormal posture: Secondary | ICD-10-CM | POA: Diagnosis not present

## 2020-11-21 DIAGNOSIS — Z8673 Personal history of transient ischemic attack (TIA), and cerebral infarction without residual deficits: Secondary | ICD-10-CM | POA: Diagnosis not present

## 2020-11-21 DIAGNOSIS — M5136 Other intervertebral disc degeneration, lumbar region: Secondary | ICD-10-CM | POA: Diagnosis not present

## 2020-11-27 DIAGNOSIS — M545 Low back pain, unspecified: Secondary | ICD-10-CM | POA: Diagnosis not present

## 2020-11-27 DIAGNOSIS — M5030 Other cervical disc degeneration, unspecified cervical region: Secondary | ICD-10-CM | POA: Diagnosis not present

## 2020-11-27 DIAGNOSIS — Z8673 Personal history of transient ischemic attack (TIA), and cerebral infarction without residual deficits: Secondary | ICD-10-CM | POA: Diagnosis not present

## 2020-11-27 DIAGNOSIS — M5136 Other intervertebral disc degeneration, lumbar region: Secondary | ICD-10-CM | POA: Diagnosis not present

## 2020-11-27 DIAGNOSIS — R293 Abnormal posture: Secondary | ICD-10-CM | POA: Diagnosis not present

## 2020-11-27 DIAGNOSIS — R2681 Unsteadiness on feet: Secondary | ICD-10-CM | POA: Diagnosis not present

## 2020-11-29 DIAGNOSIS — H43813 Vitreous degeneration, bilateral: Secondary | ICD-10-CM | POA: Diagnosis not present

## 2020-11-29 DIAGNOSIS — H2513 Age-related nuclear cataract, bilateral: Secondary | ICD-10-CM | POA: Diagnosis not present

## 2020-11-29 DIAGNOSIS — H40013 Open angle with borderline findings, low risk, bilateral: Secondary | ICD-10-CM | POA: Diagnosis not present

## 2020-11-29 DIAGNOSIS — H04123 Dry eye syndrome of bilateral lacrimal glands: Secondary | ICD-10-CM | POA: Diagnosis not present

## 2020-12-03 DIAGNOSIS — R2681 Unsteadiness on feet: Secondary | ICD-10-CM | POA: Diagnosis not present

## 2020-12-03 DIAGNOSIS — Z8673 Personal history of transient ischemic attack (TIA), and cerebral infarction without residual deficits: Secondary | ICD-10-CM | POA: Diagnosis not present

## 2020-12-03 DIAGNOSIS — R293 Abnormal posture: Secondary | ICD-10-CM | POA: Diagnosis not present

## 2020-12-03 DIAGNOSIS — M5030 Other cervical disc degeneration, unspecified cervical region: Secondary | ICD-10-CM | POA: Diagnosis not present

## 2020-12-03 DIAGNOSIS — M545 Low back pain, unspecified: Secondary | ICD-10-CM | POA: Diagnosis not present

## 2020-12-03 DIAGNOSIS — M5136 Other intervertebral disc degeneration, lumbar region: Secondary | ICD-10-CM | POA: Diagnosis not present

## 2020-12-05 DIAGNOSIS — R2681 Unsteadiness on feet: Secondary | ICD-10-CM | POA: Diagnosis not present

## 2020-12-05 DIAGNOSIS — Z8673 Personal history of transient ischemic attack (TIA), and cerebral infarction without residual deficits: Secondary | ICD-10-CM | POA: Diagnosis not present

## 2020-12-05 DIAGNOSIS — M545 Low back pain, unspecified: Secondary | ICD-10-CM | POA: Diagnosis not present

## 2020-12-05 DIAGNOSIS — M5136 Other intervertebral disc degeneration, lumbar region: Secondary | ICD-10-CM | POA: Diagnosis not present

## 2020-12-05 DIAGNOSIS — R293 Abnormal posture: Secondary | ICD-10-CM | POA: Diagnosis not present

## 2020-12-05 DIAGNOSIS — M5030 Other cervical disc degeneration, unspecified cervical region: Secondary | ICD-10-CM | POA: Diagnosis not present

## 2020-12-06 NOTE — Progress Notes (Signed)
[facility] Note  12/10/2020     CHIEF COMPLAINT Patient presents for Retina Evaluation   HISTORY OF PRESENT ILLNESS: Karen Shannon is a 70 y.o. female who presents to the clinic today for:   HPI     Retina Evaluation   In both eyes.  This started 4 months ago.  Associated Symptoms Flashes and Floaters.  I, the attending physician,  performed the HPI with the patient and updated documentation appropriately.        Comments   Retina eval per Dr. Kathlen Mody for PVD and FOLs.   Past 2 weeks her vision has been blurred, like looking through a cloud.  Having migraines around OD that is causing eye to hurt.  Patient has had TIA 10/2018, 12/2018, CVA 07/2020- 1st stroke left her with no hearing and part of vision OS.  Most of the vision has come back OS.  After the CVA she started having FOLs OD.  Then started having this "blob of a spider web" moving over OD.  A couple of months later the FOLs and web started in OS.  On occasion OD will still flash but OS has not slowed down exp at night.   11/1997 she woke up OD swollen shut, went to OS, Dr. 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  Lasted from 10/99- 03/1998.  Almost went blind from it per pt.        Last edited by Bernarda Caffey, MD on 12/11/2020  9:04 AM.    Pt is here on the referral of Dr. Kathlen Mody for concern of fol and floaters OU, pt states she mainly sees the fol at night, she started seeing them OD in June and OS was a couple months later, pt states Dr. Kathlen Mody told her he can tell she has the beginnings of ARMD in the left eye  Referring physician: Hortencia Pilar, MD [address],  [place] [address]  HISTORICAL INFORMATION:   Selected notes from the [facility] Referred by Dr. Kathlen Mody for eval of PVD OU LEE: 10.20.2022 Ocular Hx-PVD OU, cataracts, glc suspect PMH-    CURRENT MEDICATIONS: No current outpatient medications on  file. (Ophthalmic Drugs)   No current facility-administered medications for this visit. (Ophthalmic Drugs)   Current Outpatient Medications (Other)  Medication Sig   ascorbic acid (VITAMIN C) 500 MG tablet Take 500 mg by mouth daily.   aspirin 81 MG chewable tablet Chew 81 mg by mouth daily.   atorvastatin (LIPITOR) 40 MG tablet Take 40 mg by mouth daily.    Cholecalciferol 25 MCG (1000 UT) capsule Take 1,000 Units by mouth daily.   dicyclomine (BENTYL) 10 MG capsule Take 10 mg by mouth as needed for spasms (IBS).   diphenoxylate-atropine (LOMOTIL) 2.5-0.025 MG tablet Take 1 tablet by mouth as needed for diarrhea or loose stools.   donepezil (ARICEPT) 5 MG tablet Take 5 mg by mouth at bedtime.   ergocalciferol (VITAMIN D2) 50000 units capsule Take 50,000 Units by mouth once a week.   furosemide (LASIX) 40 MG tablet Take 40 mg by mouth daily.   memantine (NAMENDA) 5 MG tablet Take 5 mg by mouth 2 (two) times daily.   metoprolol succinate (TOPROL-XL) 25 MG 24 hr tablet TAKE 1 TABLET BY MOUTH EVERY DAY (Patient taking differently: Take 25 mg by mouth daily.)   montelukast (SINGULAIR) 10 MG tablet Take 10 mg by mouth at bedtime.  ondansetron (ZOFRAN) 4 MG tablet Take 4 mg by mouth every 8 (eight) hours as needed for nausea or vomiting.   Tiotropium Bromide-Olodaterol 2.5-2.5 MCG/ACT AERS Inhale 2 puffs into the lungs 1 day or 1 dose.   vitamin B-12 (CYANOCOBALAMIN) 1000 MCG tablet Take 1,000 mcg by mouth daily.   amoxicillin (AMOXIL) 500 MG tablet Take 500 mg by mouth in the morning, at noon, and at bedtime.   No current facility-administered medications for this visit. (Other)   REVIEW OF SYSTEMS: ROS   Positive for: Neurological, Skin, Musculoskeletal, Cardiovascular, Eyes, Respiratory, Allergic/Imm Negative for: Constitutional, Gastrointestinal, Genitourinary, HENT, Endocrine, Psychiatric, Heme/Lymph Last edited by Leonie Douglas, COA on 12/10/2020  9:08 AM.     ALLERGIES Allergies   Allergen Reactions   Clarithromycin Diarrhea and Other (See Comments)   Prednisone Other (See Comments)    Passed out   Sertraline Diarrhea   Sertraline Hcl Diarrhea    PAST MEDICAL HISTORY Past Medical History:  Diagnosis Date   Acute deep vein thrombosis (DVT) of left peroneal vein (HCC) 04/11/2019   Alzheimer's dementia ([place])    Cervical spondylosis 03/18/2018   Chronic bilateral low back pain without sciatica 03/18/2018   Chronic foot pain, left 03/14/2019   Formatting of this note might be different from the original. Possible fracture base 2nd metatarsal   Chronic neck pain 03/18/2018   COPD (chronic obstructive pulmonary disease) ([place])    Dr. Delena Bali   DDD (degenerative disc disease), cervical 03/18/2018   Disp fx of fifth metatarsal bone, r foot, init for opn fx    DVT (deep venous thrombosis) ([place]) 03/2020   DVT femoral (deep venous thrombosis) with thrombophlebitis, left ([place]) 04/11/2019   Dyspnea on exertion 01/27/2018   Facet arthritis, degenerative, lumbar spine 03/18/2018   Family history of malignant neoplasm of breast    Generalized anxiety disorder 01/24/2019   Possible history of panic attacks   Greater trochanteric bursitis of both hips 04/10/2018   Heart murmur 01/27/2018   History of breast cancer 03/18/2018   S/p bilateral mastectomy  Formatting of this note might be different from the original. S/p bilateral mastectomy   History of deep vein thrombosis (DVT) of lower extremity 10/02/2019   Left leg swelling 02/23/2019   Lumbar spondylosis 03/18/2018   Malignant neoplasm of breast (female), unspecified site    Malignant neoplasm of central portion of left female breast ([place])    Mitral valve prolapse 01/27/2018   Obstructive sleep apnea    Variable CPAP use.   Other idiopathic scoliosis, lumbar region 03/18/2018   Pain of left calf 03/14/2019   Palpitations 01/25/2018   Pes planus of left foot 02/23/2019   Skin cancer    basal cell   Sprain of  tarsometatarsal ligament of right foot 07/14/2019   Stasis dermatitis, acute 10/03/2019   Tenosynovitis of left ankle 04/11/2019   TIA (transient ischemic attack) 01/17/2019   Urge incontinence 07/28/2019   Varicose veins of left lower extremity    Ventricular tachycardia 02/28/2019   Past Surgical History:  Procedure Laterality Date   BUNIONECTOMY Right 1999   MASTECTOMY Bilateral 2012   [facility]   SKIN CANCER EXCISION  2018   Top of head    SKIN CANCER EXCISION  07/2019   Nose   TENDON TRANSFER Right 1999   foot    FAMILY HISTORY Family History  Problem Relation Age of Onset   Lymphoma Mother 11       deceased 39  Breast cancer Maternal Aunt        Dx 41s; Deceased 98s   Lung cancer Maternal Uncle    Stomach cancer Paternal Uncle        deceased 18   Liver cancer Maternal Uncle    Prostate cancer Other        3 mat cousins   Breast cancer Other 29       mother's mat half-sister   Emphysema Father     SOCIAL HISTORY Social History   Tobacco Use   Smoking status: Never   Smokeless tobacco: Never  Vaping Use   Vaping Use: Never used  Substance Use Topics   Alcohol use: Not Currently   Drug use: Never       OPHTHALMIC EXAM: Base Eye Exam     Visual Acuity (Snellen - Linear)       Right Left   Dist Teller 20/20 -2 20/20 -3         Tonometry (Tonopen, 9:17 AM)       Right Left   Pressure 12 12         Pupils       Dark Light Shape React APD   Right 2 1.5 Round Brisk None   Left 2 1.5 Round Brisk None         Visual Fields (Counting fingers)       Left Right    Full Full         Extraocular Movement       Right Left    Full Full         Neuro/Psych     Mood/Affect: Normal         Dilation     Both eyes: 1.0% Mydriacyl, 2.5% Phenylephrine @ 9:17 AM           Slit Lamp and Fundus Exam     External Exam       Right Left   External Normal Normal         Slit Lamp Exam       Right Left    Lids/Lashes Dermatochalasis - upper lid, Ptosis UL Dermatochalasis - upper lid, Ptosis UL   Conjunctiva/Sclera White and quiet White and quiet   Cornea trace PEE trace PEE   Anterior Chamber Deep and quiet Deep and quiet   Iris Round and dilated Round and dilated   Lens 2+ Nuclear sclerosis, 2+ Cortical cataract 2+ Nuclear sclerosis, 2+ Cortical cataract   Vitreous Mild syneresis, Posterior vitreous detachment, mild vitreous condensations Mild syneresis, Posterior vitreous detachment, vitreous condensations         Fundus Exam       Right Left   Disc Pink and Sharp, +cupping Pink and Sharp   C/D Ratio 0.65 0.6   Macula Flat, Blunted foveal reflex, RPE mottling, rare, fine drusen, No heme or edema Flat, Blunted foveal reflex, RPE mottling, rare, fine drusen, No heme or edema   Vessels mild attenuation, mild tortuousity mild attenuation, mild tortuousity   Periphery Attached, mild reticular degeneration, No heme  Attached, mild reticular degeneration, focal pigmented CR scars with VR tuft at 0130, No heme            Refraction     Wearing Rx     Type: Readers           IMAGING AND PROCEDURES  Imaging and Procedures for 12/10/2020  OCT, Retina - OU - Both Eyes       Right  Eye Quality was good. Central Foveal Thickness: 262. Progression has no prior data. Findings include normal foveal contour, no IRF, no SRF, retinal drusen .   Left Eye Quality was good. Central Foveal Thickness: 261. Progression has no prior data (Mild drusen). Findings include normal foveal contour, no IRF, no SRF.   Notes *Images captured and stored on drive  Diagnosis / Impression:  NFP, no IRF/SRF OU  Clinical management:  See below  Abbreviations: NFP - Normal foveal profile. CME - cystoid macular edema. PED - pigment epithelial detachment. IRF - intraretinal fluid. SRF - subretinal fluid. EZ - ellipsoid zone. ERM - epiretinal membrane. ORA - outer retinal atrophy. ORT - outer retinal  tubulation. SRHM - subretinal hyper-reflective material. IRHM - intraretinal hyper-reflective material            ASSESSMENT/PLAN:    ICD-10-CM   1. Vitreoretinal tuft of left eye  Q14.1     2. Retinal edema  H35.81 OCT, Retina - OU - Both Eyes    3. Posterior vitreous detachment of both eyes  H43.813     4. Early dry stage nonexudative age-related macular degeneration of both eyes  H35.3131     5. Essential hypertension  I10     6. Hypertensive retinopathy of both eyes  H35.033     7. Combined forms of age-related cataract of both eyes  H25.813      1,2. Pigmented CR scar with VR tuft OS  - pt with h/o PVD OS and persistent flashes/floaters  - exam shows pigmented CR scar with VR tuft at 0130  - discussed findings, prognosis and treatment options  - recommend laser retinopexy OS  - will bring pt back next Tuesday for laser  3. PVD / vitreous syneresis OU  - symptomatic flashes/floaters -- improving  - Discussed findings and prognosis  - No RT or RD on 360 scleral depressed exam  - Reviewed s/s of RT/RD  - Strict return precautions for any such RT/RD signs/symptoms  4. Age related macular degeneration, non-exudative, both eyes  - early stage with early fine drusen  - The incidence, anatomy, and pathology of dry AMD, risk of progression, and the AREDS and AREDS 2 study including smoking risks discussed with patient.  - Recommend amsler grid monitoring  - f/u 3 months  5,6. Hypertensive retinopathy OU - discussed importance of tight BP control - monitor  7. Mixed Cataract OU - The symptoms of cataract, surgical options, and treatments and risks were discussed with patient. - discussed diagnosis and progression - not yet visually significant - monitor for now  Ophthalmic Meds Ordered this visit:  No orders of the defined types were placed in this encounter.    Return in about 8 days (around 12/18/2020) for f/u VR tuft OS, DFE, OCT.  There are no Patient  Instructions on file for this visit.  This document serves as a record of services personally performed by Gardiner Sleeper, MD, PhD. It was created on their behalf by Leeann Must, [place], an ophthalmic technician. The creation of this record is the provider's dictation and/or activities during the visit.    Electronically signed by: Leeann Must, COA @TODAY @ 9:08 AM  This document serves as a record of services personally performed by Gardiner Sleeper, MD, PhD. It was created on their behalf by San Jetty. Owens Shark, OA an ophthalmic technician. The creation of this record is the provider's dictation and/or activities during the visit.    Electronically signed by: San Jetty.  [place], [place] 10.31.2022 9:08 AM  Gardiner Sleeper, M.D., Ph.D. Diseases & Surgery of the Retina and [place] 12/10/2020   I have reviewed the above documentation for accuracy and completeness, and I agree with the above. Gardiner Sleeper, M.D., Ph.D. 12/11/20 9:08 AM   Abbreviations: M myopia (nearsighted); A astigmatism; H hyperopia (farsighted); P presbyopia; Mrx spectacle prescription;  CTL contact lenses; OD right eye; OS left eye; OU both eyes  XT exotropia; ET esotropia; PEK punctate epithelial keratitis; PEE punctate epithelial erosions; DES dry eye syndrome; MGD meibomian gland dysfunction; ATs artificial tears; PFAT's preservative free artificial tears; [place] nuclear sclerotic cataract; PSC posterior subcapsular cataract; ERM epi-retinal membrane; PVD posterior vitreous detachment; RD retinal detachment; DM diabetes mellitus; DR diabetic retinopathy; NPDR non-proliferative diabetic retinopathy; PDR proliferative diabetic retinopathy; CSME clinically significant macular edema; DME diabetic macular edema; dbh dot blot hemorrhages; CWS cotton wool spot; POAG primary open angle glaucoma; C/D cup-to-disc ratio; HVF humphrey visual field; GVF goldmann visual field; OCT optical coherence tomography; IOP  intraocular pressure; BRVO Branch retinal vein occlusion; CRVO central retinal vein occlusion; CRAO central retinal artery occlusion; BRAO branch retinal artery occlusion; RT retinal tear; SB scleral buckle; PPV pars plana vitrectomy; VH Vitreous hemorrhage; PRP panretinal laser photocoagulation; IVK intravitreal kenalog; VMT vitreomacular traction; MH Macular hole;  NVD neovascularization of the disc; NVE neovascularization elsewhere; AREDS age related eye disease study; ARMD age related macular degeneration; POAG primary open angle glaucoma; EBMD epithelial/anterior basement membrane dystrophy; ACIOL anterior chamber intraocular lens; IOL intraocular lens; PCIOL posterior chamber intraocular lens; Phaco/IOL phacoemulsification with intraocular lens placement; [place] photorefractive keratectomy; LASIK laser assisted in situ keratomileusis; HTN hypertension; DM diabetes mellitus; COPD chronic obstructive pulmonary disease

## 2020-12-10 ENCOUNTER — Ambulatory Visit (INDEPENDENT_AMBULATORY_CARE_PROVIDER_SITE_OTHER): Payer: Medicare Other | Admitting: Ophthalmology

## 2020-12-10 ENCOUNTER — Other Ambulatory Visit: Payer: Self-pay

## 2020-12-10 DIAGNOSIS — H35033 Hypertensive retinopathy, bilateral: Secondary | ICD-10-CM

## 2020-12-10 DIAGNOSIS — H43813 Vitreous degeneration, bilateral: Secondary | ICD-10-CM | POA: Diagnosis not present

## 2020-12-10 DIAGNOSIS — I10 Essential (primary) hypertension: Secondary | ICD-10-CM

## 2020-12-10 DIAGNOSIS — H25813 Combined forms of age-related cataract, bilateral: Secondary | ICD-10-CM

## 2020-12-10 DIAGNOSIS — Q141 Congenital malformation of retina: Secondary | ICD-10-CM | POA: Diagnosis not present

## 2020-12-10 DIAGNOSIS — H353131 Nonexudative age-related macular degeneration, bilateral, early dry stage: Secondary | ICD-10-CM | POA: Diagnosis not present

## 2020-12-10 DIAGNOSIS — H3581 Retinal edema: Secondary | ICD-10-CM

## 2020-12-11 ENCOUNTER — Encounter (INDEPENDENT_AMBULATORY_CARE_PROVIDER_SITE_OTHER): Payer: Self-pay | Admitting: Ophthalmology

## 2020-12-11 NOTE — Progress Notes (Signed)
[facility] Note  12/18/2020     CHIEF COMPLAINT Patient presents for Retina Follow Up   HISTORY OF PRESENT ILLNESS: Karen Shannon is a 70 y.o. female who presents to the clinic today for:   HPI     Retina Follow Up   Patient presents with  Other.  In left eye.  This started 8 days ago.  I, the attending physician,  performed the HPI with the patient and updated documentation appropriately.        Comments   Patient here for 8 days retina follow up for VR tuft OS. Patient states vision doing ok. Been having blurry OU for a little while. Been like that before last visit. Been having pain in OD not OS. Been having headaches every afternoon at 3 pm. In OD temperally.  Takes allergy med and benadryl every am. After having dilation felt loopy.  Didn't take them this am.       Last edited by Bernarda Caffey, MD on 12/18/2020 12:02 PM.     Referring physician: Hortencia Pilar, MD [place],  [place] [id number]  HISTORICAL INFORMATION:  Selected notes from the MEDICAL RECORD NUMBER Referred by Dr. Kathlen Mody for eval of PVD OU LEE: 10.20.2022 Ocular Hx-PVD OU, cataracts, glc suspect   CURRENT MEDICATIONS: Current Outpatient Medications (Ophthalmic Drugs)  Medication Sig   prednisoLONE acetate (PRED FORTE) 1 % ophthalmic suspension Place 1 drop into the left eye 4 (four) times daily for 7 days.   No current facility-administered medications for this visit. (Ophthalmic Drugs)   Current Outpatient Medications (Other)  Medication Sig   amoxicillin (AMOXIL) 500 MG tablet Take 500 mg by mouth in the morning, at noon, and at bedtime.   ascorbic acid (VITAMIN C) 500 MG tablet Take 500 mg by mouth daily.   aspirin 81 MG chewable tablet Chew 81 mg by mouth daily.   atorvastatin (LIPITOR) 40 MG tablet Take 40 mg by mouth daily.    Cholecalciferol 25 MCG (1000 UT) capsule Take 1,000 Units by mouth daily.   dicyclomine (BENTYL) 10 MG  capsule Take 10 mg by mouth as needed for spasms (IBS).   diphenoxylate-atropine (LOMOTIL) 2.5-0.025 MG tablet Take 1 tablet by mouth as needed for diarrhea or loose stools.   donepezil (ARICEPT) 5 MG tablet Take 5 mg by mouth at bedtime.   ergocalciferol (VITAMIN D2) 50000 units capsule Take 50,000 Units by mouth once a week.   furosemide (LASIX) 40 MG tablet Take 40 mg by mouth daily.   memantine (NAMENDA) 5 MG tablet Take 5 mg by mouth 2 (two) times daily.   metoprolol succinate (TOPROL-XL) 25 MG 24 hr tablet TAKE 1 TABLET BY MOUTH EVERY DAY (Patient taking differently: Take 25 mg by mouth daily.)   montelukast (SINGULAIR) 10 MG tablet Take 10 mg by mouth at bedtime.   ondansetron (ZOFRAN) 4 MG tablet Take 4 mg by mouth every 8 (eight) hours as needed for nausea or vomiting.   Tiotropium Bromide-Olodaterol 2.5-2.5 MCG/ACT AERS Inhale 2 puffs into the lungs 1 day or 1 dose.   vitamin B-12 (CYANOCOBALAMIN) 1000 MCG tablet Take 1,000 mcg by mouth daily.   No current facility-administered medications for this visit. (Other)   REVIEW OF SYSTEMS: ROS   Positive for: Neurological, Skin, Musculoskeletal, Cardiovascular, Eyes, Respiratory Negative for: Constitutional, Gastrointestinal, Genitourinary, HENT, Endocrine, Psychiatric, Allergic/Imm, Heme/Lymph Last edited by Bernarda Caffey, MD on 12/18/2020 12:08 PM.  ALLERGIES Allergies  Allergen Reactions   Clarithromycin Diarrhea and Other (See Comments)   Prednisone Other (See Comments)    Passed out   Sertraline Diarrhea   Sertraline Hcl Diarrhea   PAST MEDICAL HISTORY Past Medical History:  Diagnosis Date   Acute deep vein thrombosis (DVT) of left peroneal vein (HCC) 04/11/2019   Alzheimer's dementia ([place])    Cervical spondylosis 03/18/2018   Chronic bilateral low back pain without sciatica 03/18/2018   Chronic foot pain, left 03/14/2019   Formatting of this note might be different from the original. Possible fracture base 2nd  metatarsal   Chronic neck pain 03/18/2018   COPD (chronic obstructive pulmonary disease) ([place])    Dr. Delena Bali   DDD (degenerative disc disease), cervical 03/18/2018   Disp fx of fifth metatarsal bone, r foot, init for opn fx    DVT (deep venous thrombosis) ([place]) 03/2020   DVT femoral (deep venous thrombosis) with thrombophlebitis, left ([place]) 04/11/2019   Dyspnea on exertion 01/27/2018   Facet arthritis, degenerative, lumbar spine 03/18/2018   Family history of malignant neoplasm of breast    Generalized anxiety disorder 01/24/2019   Possible history of panic attacks   Greater trochanteric bursitis of both hips 04/10/2018   Heart murmur 01/27/2018   History of breast cancer 03/18/2018   S/p bilateral mastectomy  Formatting of this note might be different from the original. S/p bilateral mastectomy   History of deep vein thrombosis (DVT) of lower extremity 10/02/2019   Left leg swelling 02/23/2019   Lumbar spondylosis 03/18/2018   Malignant neoplasm of breast (female), unspecified site    Malignant neoplasm of central portion of left female breast ([place])    Mitral valve prolapse 01/27/2018   Obstructive sleep apnea    Variable CPAP use.   Other idiopathic scoliosis, lumbar region 03/18/2018   Pain of left calf 03/14/2019   Palpitations 01/25/2018   Pes planus of left foot 02/23/2019   Skin cancer    basal cell   Sprain of tarsometatarsal ligament of right foot 07/14/2019   Stasis dermatitis, acute 10/03/2019   Tenosynovitis of left ankle 04/11/2019   TIA (transient ischemic attack) 01/17/2019   Urge incontinence 07/28/2019   Varicose veins of left lower extremity    Ventricular tachycardia 02/28/2019   Past Surgical History:  Procedure Laterality Date   BUNIONECTOMY Right 1999   MASTECTOMY Bilateral 2012   [facility]   SKIN CANCER EXCISION  2018   Top of head    SKIN CANCER EXCISION  07/2019   Nose   TENDON TRANSFER Right 1999   foot    FAMILY  HISTORY Family History  Problem Relation Age of Onset   Lymphoma Mother 60       deceased 59   Breast cancer Maternal Aunt        Dx 22s; Deceased 41s   Lung cancer Maternal Uncle    Stomach cancer Paternal Uncle        deceased 43   Liver cancer Maternal Uncle    Prostate cancer Other        3 mat cousins   Breast cancer Other 1       mother's mat half-sister   Emphysema Father     SOCIAL HISTORY Social History   Tobacco Use   Smoking status: Never   Smokeless tobacco: Never  Vaping Use   Vaping Use: Never used  Substance Use Topics   Alcohol use: Not Currently   Drug use:  Never       OPHTHALMIC EXAM: Base Eye Exam     Visual Acuity (Snellen - Linear)       Right Left   Dist [place] 20/20 -2 20/20         Tonometry (Tonopen, 9:52 AM)       Right Left   Pressure 22 19         Pupils       Dark Light Shape React APD   Right 2 1.5 Round Brisk None   Left 2 1.5 Round Brisk None         Visual Fields (Counting fingers)       Left Right    Full Full         Extraocular Movement       Right Left    Full Full         Neuro/Psych     Oriented x3: Yes   Mood/Affect: Normal         Dilation     Both eyes: 1.0% Mydriacyl, 2.5% Phenylephrine @ 9:52 AM           Slit Lamp and Fundus Exam     External Exam       Right Left   External Normal Normal         Slit Lamp Exam       Right Left   Lids/Lashes Dermatochalasis - upper lid, Ptosis UL Dermatochalasis - upper lid, Ptosis UL   Conjunctiva/Sclera White and quiet White and quiet   Cornea trace PEE trace PEE   Anterior Chamber Deep and quiet Deep and quiet   Iris Round and dilated Round and dilated   Lens 2+ Nuclear sclerosis, 2+ Cortical cataract 2+ Nuclear sclerosis, 2+ Cortical cataract   Vitreous Mild syneresis, Posterior vitreous detachment, mild vitreous condensations Mild syneresis, Posterior vitreous detachment, vitreous condensations         Fundus Exam        Right Left   Disc Pink and Sharp, +cupping Pink and Sharp   C/D Ratio 0.65 0.6   Macula Flat, Blunted foveal reflex, RPE mottling, rare, fine drusen, No heme or edema Flat, Blunted foveal reflex, RPE mottling, rare, fine drusen, No heme or edema   Vessels mild attenuation, mild tortuousity mild attenuation, mild tortuousity   Periphery Attached, mild reticular degeneration, No heme  Attached, mild reticular degeneration, focal pigmented CR scars with VR tuft at 0130, No heme            Refraction     Wearing Rx     Type: Readers           IMAGING AND PROCEDURES  Imaging and Procedures for 12/18/2020  OCT, Retina - OU - Both Eyes       Right Eye Quality was good. Central Foveal Thickness: 261. Progression has been stable. Findings include normal foveal contour, no IRF, no SRF, retinal drusen .   Left Eye Quality was good. Central Foveal Thickness: 262. Progression has been stable. Findings include normal foveal contour, no IRF, no SRF (Mild drusen).   Notes *Images captured and stored on drive  Diagnosis / Impression:  NFP, no IRF/SRF OU  Clinical management:  See below  Abbreviations: NFP - Normal foveal profile. CME - cystoid macular edema. PED - pigment epithelial detachment. IRF - intraretinal fluid. SRF - subretinal fluid. EZ - ellipsoid zone. ERM - epiretinal membrane. ORA - outer retinal atrophy. ORT - outer retinal tubulation. SRHM -  subretinal hyper-reflective material. IRHM - intraretinal hyper-reflective material      Repair Retinal Breaks, Laser - OS - Left Eye       LASER PROCEDURE NOTE  Procedure:  Barrier laser retinopexy using slit lamp laser, LEFT eye   Diagnosis:   Retinal defect, LEFT eye                     Pigmented VR tuft at 1 o'clock anterior to equator   Surgeon: Bernarda Caffey, MD, PhD  Anesthesia: Topical  Informed consent obtained, operative eye marked, and time out performed prior to initiation of laser.   Laser settings:   Lumenis [id number] laser, slit lamp Lens: [place] PRP 165 Power: 380 mW Spot size: 200 microns Duration: 40 msec  # spots: 317  Placement of laser: Using a [place]ster PRP 165 contact lens at the slit lamp, laser was placed in three confluent rows around pigmented VR tuft at 1 oclock anterior to equator.  Complications: None.  Patient tolerated the procedure well and received written and verbal post-procedure care information/education.            ASSESSMENT/PLAN:    ICD-10-CM   1. Vitreoretinal tuft of left eye  Q14.1 Repair Retinal Breaks, Laser - OS - Left Eye    2. Left retinal defect  H33.302 Repair Retinal Breaks, Laser - OS - Left Eye    3. Retinal edema  H35.81 OCT, Retina - OU - Both Eyes    4. Posterior vitreous detachment of both eyes  H43.813     5. Early dry stage nonexudative age-related macular degeneration of both eyes  H35.3131     6. Essential hypertension  I10     7. Hypertensive retinopathy of both eyes  H35.033     8. Combined forms of age-related cataract of both eyes  H25.813      1-3. Pigmented CR scar with VR tuft OS  - pt with h/o PVD OS and persistent flashes/floaters  - exam shows pigmented CR scar with VR tuft at 0100  - recommend laser retinopexy OS today, 11.08.22  - pt wishes to proceed from laser  - RBA of procedure discussed, questions answered - informed consent obtained and signed - see procedure note - start PF QID OS x7 days - f/u 4-6 weeks, DFE, OCT  4. PVD / vitreous syneresis OU  - symptomatic flashes/floaters -- improving  - Discussed findings and prognosis  - No RT or RD on 360 scleral depressed exam  - Reviewed s/s of RT/RD  - Strict return precautions for any such RT/RD signs/symptoms  5. Age related macular degeneration, non-exudative, both eyes  - early stage with early fine drusen  - The incidence, anatomy, and pathology of dry AMD, risk of progression, and the AREDS and AREDS 2 study including smoking risks  discussed with patient.  - Recommend amsler grid monitoring  - f/u 3 months  6,7. Hypertensive retinopathy OU - discussed importance of tight BP control - monitor  8. Mixed Cataract OU - The symptoms of cataract, surgical options, and treatments and risks were discussed with patient. - discussed diagnosis and progression - not yet visually significant - monitor for now  Ophthalmic Meds Ordered this visit:  Meds ordered this encounter  Medications   prednisoLONE acetate (PRED FORTE) 1 % ophthalmic suspension    Sig: Place 1 drop into the left eye 4 (four) times daily for 7 days.    Dispense:  10 mL  Refill:  0     Return for f/u 4-6 weeks, VR tuft OS, DFE, OCT.  There are no Patient Instructions on file for this visit.  This document serves as a record of services personally performed by Gardiner Sleeper, MD, PhD. It was created on their behalf by Estill Bakes, COT an ophthalmic technician. The creation of this record is the provider's dictation and/or activities during the visit.    Electronically signed by: Estill Bakes, COT 11.1.22 @ 12:33 PM   This document serves as a record of services personally performed by Gardiner Sleeper, MD, PhD. It was created on their behalf by San Jetty. Owens Shark, OA an ophthalmic technician. The creation of this record is the provider's dictation and/or activities during the visit.    Electronically signed by: San Jetty. Marguerita Merles 11.08.2022 12:33 PM  Gardiner Sleeper, M.D., Ph.D. Diseases & Surgery of the Retina and Vitreous [facility]  I have reviewed the above documentation for accuracy and completeness, and I agree with the above. Gardiner Sleeper, M.D., Ph.D. 12/18/20 12:33 PM  Abbreviations: M myopia (nearsighted); A astigmatism; H hyperopia (farsighted); P presbyopia; Mrx spectacle prescription;  CTL contact lenses; OD right eye; OS left eye; OU both eyes  XT exotropia; ET esotropia; PEK punctate epithelial keratitis;  PEE punctate epithelial erosions; DES dry eye syndrome; MGD meibomian gland dysfunction; ATs artificial tears; PFAT's preservative free artificial tears; [place] nuclear sclerotic cataract; PSC posterior subcapsular cataract; ERM epi-retinal membrane; PVD posterior vitreous detachment; RD retinal detachment; DM diabetes mellitus; DR diabetic retinopathy; NPDR non-proliferative diabetic retinopathy; PDR proliferative diabetic retinopathy; CSME clinically significant macular edema; DME diabetic macular edema; dbh dot blot hemorrhages; CWS cotton wool spot; POAG primary open angle glaucoma; C/D cup-to-disc ratio; HVF humphrey visual field; GVF goldmann visual field; OCT optical coherence tomography; IOP intraocular pressure; BRVO Branch retinal vein occlusion; CRVO central retinal vein occlusion; CRAO central retinal artery occlusion; BRAO branch retinal artery occlusion; RT retinal tear; SB scleral buckle; PPV pars plana vitrectomy; VH Vitreous hemorrhage; PRP panretinal laser photocoagulation; IVK intravitreal kenalog; VMT vitreomacular traction; MH Macular hole;  NVD neovascularization of the disc; NVE neovascularization elsewhere; AREDS age related eye disease study; ARMD age related macular degeneration; POAG primary open angle glaucoma; EBMD epithelial/anterior basement membrane dystrophy; ACIOL anterior chamber intraocular lens; IOL intraocular lens; PCIOL posterior chamber intraocular lens; Phaco/IOL phacoemulsification with intraocular lens placement; [place] photorefractive keratectomy; LASIK laser assisted in situ keratomileusis; HTN hypertension; DM diabetes mellitus; COPD chronic obstructive pulmonary disease

## 2020-12-12 DIAGNOSIS — R293 Abnormal posture: Secondary | ICD-10-CM | POA: Diagnosis not present

## 2020-12-12 DIAGNOSIS — M5136 Other intervertebral disc degeneration, lumbar region: Secondary | ICD-10-CM | POA: Diagnosis not present

## 2020-12-12 DIAGNOSIS — M5030 Other cervical disc degeneration, unspecified cervical region: Secondary | ICD-10-CM | POA: Diagnosis not present

## 2020-12-12 DIAGNOSIS — R531 Weakness: Secondary | ICD-10-CM | POA: Diagnosis not present

## 2020-12-12 DIAGNOSIS — R2681 Unsteadiness on feet: Secondary | ICD-10-CM | POA: Diagnosis not present

## 2020-12-12 DIAGNOSIS — G8929 Other chronic pain: Secondary | ICD-10-CM | POA: Diagnosis not present

## 2020-12-12 DIAGNOSIS — M545 Low back pain, unspecified: Secondary | ICD-10-CM | POA: Diagnosis not present

## 2020-12-13 NOTE — Progress Notes (Signed)
[facility]  [address] [place],  [place]  [address] [phone]  Clinic Day:  12/21/2020  Referring physician: Nicoletta Dress, MD  This document serves as a record of services personally performed by Hosie Poisson, MD. It was created on their behalf by [facility], a trained medical scribe. The creation of this record is based on the scribe's personal observations and the provider's statements to them.  CHIEF COMPLAINT:  CC:   Stage I A triple negative breast cancer  Current Treatment:   Observation    HISTORY OF PRESENT ILLNESS:  Karen Shannon is a 70 y.o. female with a history of\ stage IA (T1b N0 M0) triple negative left breast cancer diagnosed in January 2012.  She underwent bilateral mastectomies at her request.  Pathology revealed a 9 mm, grade 1, invasive ductal carcinoma with negative nodes.  Estrogen and progesterone receptors were negative and her 2 Neu negative.  Ki 67 was 9%.  She was placed on observation alone.  She was referred to genetic counselor at [facility] due to her triple negative disease, as well as family history of malignancy.  She underwent testing with the [facility] Next Panel in 2015.  This did not reveal any clinically significant mutation. 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   She reported 2 strokes since September 2020. She described transient ischemic attacks in September and October 2020. She states Dr. Tomi Likens did not do the MRI/MRA head, as the sed rate was normal.  She is followed by by Dr. Metta Clines, a neurologist, at [facility]  She stated she was scheduled for an MRI/MRA head at [facility], as well as a cognitive test in January 2021.  We were concerned about temporal arteritis, but a sed rate was normal.  She was found to have hypokalemia, so we placed her on potassium 20 mEq daily.  She also complained of nausea after eating and intermittent sharp, shooting abdominal pain.  We  gave her ondansetron 4 mg every 4 hours as needed for the nausea.  CT abdomen and pelvis did not reveal any acute abnormality. Colon diverticulosis without diverticulitis was noted.   She told us she had decreased memory on her cognitive test and he plans to repeat that yearly.    She was found to have a deep venous thrombus of the left leg in at [facility] in February 2021, for which she was placed on apixaban 5 mg twice daily and was given samples. She could not afford apixaban, so was placed on aspirin 81 mg daily once the thrombus resolved resolved. She has a history of mitral valve prolapse and murmur, for which she sees Dr. Agustin Cree.  She states she wore a heart monitor for 2 weeks in February 2021, which apparently revealed irregular rhythm. She had a stress test stress test in his office, which was felt to be normal.  She also had ultrasounds of the carotid arteries, which were normal. Bone density scan in July 2021 revealed osteopenia. She had eyelid surgery in October 2021. She undergoes routine lab work at Dr. Denton Lank office.   INTERVAL HISTORY:  Karen Shannon is here for routine follow up. She had her 3rd stoke on June 1st which severely effected her memory. She has been diagnosed with Alzheimer's and is now on medication with improvement. She has been undergoing physical therapy for the past 3 months. She has a stenosed carotid artery at 65% on the left, and will require surgery  if it gets over 80%. She also notes that she has a cyst of the thyroid. She recently underwent retina surgery this week as she had started to experience symptoms of flashing lights. Her  appetite is good, and she has lost 6 and 1/2 pounds since her last visit.  She denies fever, chills or other signs of infection.  She denies nausea, vomiting, bowel issues, or abdominal pain.  She denies sore throat, cough, dyspnea, or chest pain.  REVIEW OF SYSTEMS:  Review of Systems  Constitutional: Negative.  Negative for appetite  change, chills, fatigue, fever and unexpected weight change.  HENT:  Negative.    Eyes: Negative.   Respiratory: Negative.  Negative for chest tightness, cough, hemoptysis, shortness of breath and wheezing.   Cardiovascular: Negative.  Negative for chest pain, leg swelling and palpitations.  Gastrointestinal: Negative.  Negative for abdominal distention, abdominal pain, blood in stool, constipation, diarrhea, nausea and vomiting.  Endocrine: Negative.   Genitourinary: Negative.  Negative for difficulty urinating, dysuria, frequency and hematuria.   Musculoskeletal: Negative.  Negative for arthralgias, back pain, flank pain, gait problem and myalgias.  Skin: Negative.   Neurological: Negative.  Negative for dizziness, extremity weakness, gait problem, headaches, light-headedness, numbness, seizures and speech difficulty.  Hematological: Negative.   Psychiatric/Behavioral:  Positive for confusion (memory impairment, improved with medication). Negative for depression and sleep disturbance. The patient is not nervous/anxious.     VITALS:  Blood pressure 136/69, pulse 80, temperature 97.9 F (36.6 C), temperature source Oral, resp. rate 18, height _0  (1.626 m), weight 174 lb 12.8 oz (79.3 kg), SpO2 99 %.  Wt Readings from Last 3 Encounters:  12/21/20 174 lb 12.8 oz (79.3 kg)  10/29/20 171 lb 3.2 oz (77.7 kg)  08/29/20 175 lb 9.6 oz (79.7 kg)    Body mass index is 30 kg/m.  Performance status (ECOG): 1 - Symptomatic but completely ambulatory  PHYSICAL EXAM:  Physical Exam Constitutional:      General: She is not in acute distress.    Appearance: Normal appearance. She is normal weight.  HENT:     Head: Normocephalic and atraumatic.  Eyes:     General: No scleral icterus.    Extraocular Movements: Extraocular movements intact.     Conjunctiva/sclera: Conjunctivae normal.     Pupils: Pupils are equal, round, and reactive to light.  Cardiovascular:     Rate and Rhythm: Normal rate  and regular rhythm.     Pulses: Normal pulses.     Heart sounds: Normal heart sounds. No murmur heard.   No friction rub. No gallop.  Pulmonary:     Effort: Pulmonary effort is normal. No respiratory distress.     Breath sounds: Normal breath sounds.  Chest:  Breasts:    Right: Absent.     Left: Absent.     Comments: Bilateral mastectomies are negative Abdominal:     General: Bowel sounds are normal. There is no distension.     Palpations: Abdomen is soft. There is no hepatomegaly, splenomegaly or mass.     Tenderness: There is no abdominal tenderness.  Musculoskeletal:        General: Normal range of motion.     Cervical back: Normal range of motion and neck supple.     Right lower leg: No edema.     Left lower leg: No edema.  Lymphadenopathy:     Cervical: No cervical adenopathy.  Skin:    General: Skin is warm and dry.  Neurological:  General: No focal deficit present.     Mental Status: She is alert and oriented to person, place, and time. Mental status is at baseline.  Psychiatric:        Mood and Affect: Mood normal.        Behavior: Behavior normal.        Thought Content: Thought content normal.        Judgment: Judgment normal.   LABS:  No flowsheet data found. No flowsheet data found.   STUDIES:  OCT, Retina - OU - Both Eyes  Result Date: 12/18/2020 Right Eye Quality was good. Central Foveal Thickness: 261. Progression has been stable. Findings include normal foveal contour, no IRF, no SRF, retinal drusen . Left Eye Quality was good. Central Foveal Thickness: 262. Progression has been stable. Findings include normal foveal contour, no IRF, no SRF (Mild drusen). Notes *Images captured and stored on drive Diagnosis / Impression: NFP, no IRF/SRF OU Clinical management: See below Abbreviations: NFP - Normal foveal profile. CME - cystoid macular edema. PED - pigment epithelial detachment. IRF - intraretinal fluid. SRF - subretinal fluid. EZ - ellipsoid zone. ERM -  epiretinal membrane. ORA - outer retinal atrophy. ORT - outer retinal tubulation. SRHM - subretinal hyper-reflective material. IRHM - intraretinal hyper-reflective material   OCT, Retina - OU - Both Eyes  Result Date: 12/11/2020 Right Eye Quality was good. Central Foveal Thickness: 262. Progression has no prior data. Findings include normal foveal contour, no IRF, no SRF, retinal drusen . Left Eye Quality was good. Central Foveal Thickness: 261. Progression has no prior data (Mild drusen). Findings include normal foveal contour, no IRF, no SRF. Notes *Images captured and stored on drive Diagnosis / Impression: NFP, no IRF/SRF OU Clinical management: See below Abbreviations: NFP - Normal foveal profile. CME - cystoid macular edema. PED - pigment epithelial detachment. IRF - intraretinal fluid. SRF - subretinal fluid. EZ - ellipsoid zone. ERM - epiretinal membrane. ORA - outer retinal atrophy. ORT - outer retinal tubulation. SRHM - subretinal hyper-reflective material. IRHM - intraretinal hyper-reflective material   Repair Retinal Breaks, Laser - OS - Left Eye  Result Date: 12/18/2020 LASER PROCEDURE NOTE Procedure:  Barrier laser retinopexy using slit lamp laser, LEFT eye Diagnosis:   Retinal defect, LEFT eye                     Pigmented VR tuft at 1 o'clock anterior to equator Surgeon: Bernarda Caffey, MD, PhD Anesthesia: Topical Informed consent obtained, operative eye marked, and time out performed prior to initiation of laser. Laser settings: Lumenis [id number] laser, slit lamp Lens: Mainster PRP 165 Power: 380 mW Spot size: 200 microns Duration: 40 msec # spots: 317 Placement of laser: Using a Mainster PRP 165 contact lens at the slit lamp, laser was placed in three confluent rows around pigmented VR tuft at 1 oclock anterior to equator. Complications: None. Patient tolerated the procedure well and received written and verbal post-procedure care information/education.     HISTORY:   Allergies:   Allergies  Allergen Reactions   Clarithromycin Diarrhea and Other (See Comments)   Prednisone Other (See Comments)    Passed out   Sertraline Diarrhea   Sertraline Hcl Diarrhea    Current Medications: Current Outpatient Medications  Medication Sig Dispense Refill   furosemide (LASIX) 20 MG tablet      tamsulosin (FLOMAX) 0.4 MG CAPS capsule      ascorbic acid (VITAMIN C) 500 MG tablet Take 500 mg by mouth  daily.     aspirin 81 MG chewable tablet Chew 81 mg by mouth daily.     atorvastatin (LIPITOR) 40 MG tablet Take 40 mg by mouth daily.      Cholecalciferol 25 MCG (1000 UT) capsule Take 1,000 Units by mouth daily.     dicyclomine (BENTYL) 10 MG capsule Take 10 mg by mouth as needed for spasms (IBS).     diphenoxylate-atropine (LOMOTIL) 2.5-0.025 MG tablet Take 1 tablet by mouth as needed for diarrhea or loose stools.     donepezil (ARICEPT) 5 MG tablet Take 5 mg by mouth at bedtime.     ergocalciferol (VITAMIN D2) 50000 units capsule Take 50,000 Units by mouth once a week.     memantine (NAMENDA) 5 MG tablet Take 5 mg by mouth 2 (two) times daily.     metoprolol succinate (TOPROL-XL) 25 MG 24 hr tablet TAKE 1 TABLET BY MOUTH EVERY DAY (Patient taking differently: Take 25 mg by mouth daily.) 90 tablet 2   montelukast (SINGULAIR) 10 MG tablet Take 10 mg by mouth at bedtime.     ondansetron (ZOFRAN) 4 MG tablet Take 4 mg by mouth every 8 (eight) hours as needed for nausea or vomiting.     prednisoLONE acetate (PRED FORTE) 1 % ophthalmic suspension Place 1 drop into the left eye 4 (four) times daily for 7 days. 10 mL 0   Tiotropium Bromide-Olodaterol 2.5-2.5 MCG/ACT AERS Inhale 2 puffs into the lungs 1 day or 1 dose.     vitamin B-12 (CYANOCOBALAMIN) 1000 MCG tablet Take 1,000 mcg by mouth daily.     No current facility-administered medications for this visit.     ASSESSMENT & PLAN:   Assessment:   1. History of stage I triple negative breast cancer, diagnosed January 2012, status  post bilateral mastectomy. She remains without evidence of recurrence. Genetic testing in 2015 was negative.  I reviewed her case with the genetic counselor and further testing is not recommended based on her personal and family history.   2. History of transient ischemic attacks, and in June 2022 had a CVA.  This impaired her memory, but she has been placed on medication with improvement. She continues physical therapy. She continues to follow with Dr. Tomi Likens.   3. Deep venous thrombosis of the left lower extremity in May 2021.  4. Provoked deep venous thrombosis of the right lower extremity in February 2022, for which she continues aspirin  81 mg daily daily.  5. Alzheimer's dementia. She has been placed on medication, Namenda and Aricept with improvement.  Plan:     We will plan to see her back in 6 months for re-examination, as she remains very uncomfortable switching to annual visits.  The patient understands the plans discussed today and is in agreement with them.  She knows to contact our office if she develops concerns prior to her next appointment.   I, Rita Ohara, am acting as scribe for Derwood Kaplan, MD  I have reviewed this report as typed by the medical scribe, and it is complete and accurate.

## 2020-12-14 DIAGNOSIS — R531 Weakness: Secondary | ICD-10-CM | POA: Diagnosis not present

## 2020-12-14 DIAGNOSIS — M545 Low back pain, unspecified: Secondary | ICD-10-CM | POA: Diagnosis not present

## 2020-12-14 DIAGNOSIS — M5030 Other cervical disc degeneration, unspecified cervical region: Secondary | ICD-10-CM | POA: Diagnosis not present

## 2020-12-14 DIAGNOSIS — M5136 Other intervertebral disc degeneration, lumbar region: Secondary | ICD-10-CM | POA: Diagnosis not present

## 2020-12-14 DIAGNOSIS — G8929 Other chronic pain: Secondary | ICD-10-CM | POA: Diagnosis not present

## 2020-12-14 DIAGNOSIS — R2681 Unsteadiness on feet: Secondary | ICD-10-CM | POA: Diagnosis not present

## 2020-12-18 ENCOUNTER — Ambulatory Visit (INDEPENDENT_AMBULATORY_CARE_PROVIDER_SITE_OTHER): Payer: Medicare Other | Admitting: Ophthalmology

## 2020-12-18 ENCOUNTER — Other Ambulatory Visit: Payer: Self-pay

## 2020-12-18 ENCOUNTER — Encounter (INDEPENDENT_AMBULATORY_CARE_PROVIDER_SITE_OTHER): Payer: Self-pay | Admitting: Ophthalmology

## 2020-12-18 DIAGNOSIS — H3581 Retinal edema: Secondary | ICD-10-CM

## 2020-12-18 DIAGNOSIS — Q141 Congenital malformation of retina: Secondary | ICD-10-CM | POA: Diagnosis not present

## 2020-12-18 DIAGNOSIS — H43813 Vitreous degeneration, bilateral: Secondary | ICD-10-CM

## 2020-12-18 DIAGNOSIS — I10 Essential (primary) hypertension: Secondary | ICD-10-CM | POA: Diagnosis not present

## 2020-12-18 DIAGNOSIS — H353131 Nonexudative age-related macular degeneration, bilateral, early dry stage: Secondary | ICD-10-CM

## 2020-12-18 DIAGNOSIS — H35033 Hypertensive retinopathy, bilateral: Secondary | ICD-10-CM | POA: Diagnosis not present

## 2020-12-18 DIAGNOSIS — H33302 Unspecified retinal break, left eye: Secondary | ICD-10-CM

## 2020-12-18 DIAGNOSIS — H25813 Combined forms of age-related cataract, bilateral: Secondary | ICD-10-CM

## 2020-12-18 MED ORDER — PREDNISOLONE ACETATE 1 % OP SUSP: 1.0000 [drp] | Freq: Four times a day (QID) | OPHTHALMIC | 0 refills | Status: AC

## 2020-12-21 ENCOUNTER — Inpatient Hospital Stay: Payer: Medicare Other | Attending: Oncology | Admitting: Oncology

## 2020-12-21 ENCOUNTER — Telehealth: Payer: Self-pay | Admitting: Oncology

## 2020-12-21 ENCOUNTER — Encounter: Payer: Self-pay | Admitting: Oncology

## 2020-12-21 VITALS — BP 136/69 | HR 80 | Temp 97.9°F | Resp 18 | Ht 64.0 in | Wt 174.8 lb

## 2020-12-21 DIAGNOSIS — Z780 Asymptomatic menopausal state: Secondary | ICD-10-CM | POA: Diagnosis not present

## 2020-12-21 DIAGNOSIS — M8580 Other specified disorders of bone density and structure, unspecified site: Secondary | ICD-10-CM | POA: Diagnosis not present

## 2020-12-21 DIAGNOSIS — C50112 Malignant neoplasm of central portion of left female breast: Secondary | ICD-10-CM | POA: Diagnosis not present

## 2020-12-21 DIAGNOSIS — Z171 Estrogen receptor negative status [ER-]: Secondary | ICD-10-CM | POA: Diagnosis not present

## 2020-12-21 HISTORY — DX: Other specified disorders of bone density and structure, unspecified site: M85.80

## 2020-12-21 NOTE — Telephone Encounter (Signed)
Per 11/11 los next appt scheduled and given to patient

## 2020-12-24 ENCOUNTER — Ambulatory Visit: Payer: Medicare Other | Admitting: Cardiology

## 2020-12-24 DIAGNOSIS — R531 Weakness: Secondary | ICD-10-CM | POA: Diagnosis not present

## 2020-12-24 DIAGNOSIS — M5136 Other intervertebral disc degeneration, lumbar region: Secondary | ICD-10-CM | POA: Diagnosis not present

## 2020-12-24 DIAGNOSIS — R2681 Unsteadiness on feet: Secondary | ICD-10-CM | POA: Diagnosis not present

## 2020-12-24 DIAGNOSIS — M5030 Other cervical disc degeneration, unspecified cervical region: Secondary | ICD-10-CM | POA: Diagnosis not present

## 2020-12-24 DIAGNOSIS — G8929 Other chronic pain: Secondary | ICD-10-CM | POA: Diagnosis not present

## 2020-12-24 DIAGNOSIS — M545 Low back pain, unspecified: Secondary | ICD-10-CM | POA: Diagnosis not present

## 2020-12-25 ENCOUNTER — Encounter (INDEPENDENT_AMBULATORY_CARE_PROVIDER_SITE_OTHER): Payer: Self-pay | Admitting: Ophthalmology

## 2020-12-25 ENCOUNTER — Other Ambulatory Visit: Payer: Self-pay

## 2020-12-25 ENCOUNTER — Ambulatory Visit (INDEPENDENT_AMBULATORY_CARE_PROVIDER_SITE_OTHER): Payer: Medicare Other | Admitting: Ophthalmology

## 2020-12-25 DIAGNOSIS — I10 Essential (primary) hypertension: Secondary | ICD-10-CM

## 2020-12-25 DIAGNOSIS — Q141 Congenital malformation of retina: Secondary | ICD-10-CM | POA: Diagnosis not present

## 2020-12-25 DIAGNOSIS — C44311 Basal cell carcinoma of skin of nose: Secondary | ICD-10-CM | POA: Diagnosis not present

## 2020-12-25 DIAGNOSIS — H25813 Combined forms of age-related cataract, bilateral: Secondary | ICD-10-CM

## 2020-12-25 DIAGNOSIS — H353131 Nonexudative age-related macular degeneration, bilateral, early dry stage: Secondary | ICD-10-CM

## 2020-12-25 DIAGNOSIS — H35033 Hypertensive retinopathy, bilateral: Secondary | ICD-10-CM

## 2020-12-25 DIAGNOSIS — H43813 Vitreous degeneration, bilateral: Secondary | ICD-10-CM

## 2020-12-25 DIAGNOSIS — H3581 Retinal edema: Secondary | ICD-10-CM

## 2020-12-25 DIAGNOSIS — H33302 Unspecified retinal break, left eye: Secondary | ICD-10-CM

## 2020-12-25 DIAGNOSIS — L570 Actinic keratosis: Secondary | ICD-10-CM | POA: Diagnosis not present

## 2020-12-25 DIAGNOSIS — L821 Other seborrheic keratosis: Secondary | ICD-10-CM | POA: Diagnosis not present

## 2020-12-25 DIAGNOSIS — L578 Other skin changes due to chronic exposure to nonionizing radiation: Secondary | ICD-10-CM | POA: Diagnosis not present

## 2020-12-25 NOTE — Progress Notes (Signed)
[facility] Note  12/25/2020     CHIEF COMPLAINT Patient presents for Retina Follow Up   HISTORY OF PRESENT ILLNESS: Karen Shannon is a 70 y.o. female who presents to the clinic today for:   HPI     Retina Follow Up   In left eye.  This started 3 days ago.  Duration of 3 days.  Since onset it is gradually worsening.  I, the attending physician,  performed the HPI with the patient and updated documentation appropriately.        Comments   Patient states had laser retinopexy OS on 11.08.22. Had "arrows" in vision OS with flashes directly after laser. Starting 3 days ago, patient has a "glob" in vision. Spot in vision stays near central vision. Moves slightly at times. Overall vision looks like a frosted window OS.       Last edited by Bernarda Caffey, MD on 12/25/2020  9:40 PM.    Patient states she can see though the area but it's cloudy. She sleeps in a recliner at about a 45 degree angle.  Referring physician: Hortencia Pilar, MD [place],  [place] [address]  HISTORICAL INFORMATION:  Selected notes from the MEDICAL RECORD NUMBER Referred by Dr. Kathlen Mody for eval of PVD OU LEE: 10.20.2022 Ocular Hx-PVD OU, cataracts, glc suspect   CURRENT MEDICATIONS: Current Outpatient Medications (Ophthalmic Drugs)  Medication Sig   prednisoLONE acetate (PRED FORTE) 1 % ophthalmic suspension Place 1 drop into the left eye 4 (four) times daily for 7 days.   No current facility-administered medications for this visit. (Ophthalmic Drugs)   Current Outpatient Medications (Other)  Medication Sig   ascorbic acid (VITAMIN C) 500 MG tablet Take 500 mg by mouth daily.   aspirin 81 MG chewable tablet Chew 81 mg by mouth daily.   atorvastatin (LIPITOR) 40 MG tablet Take 40 mg by mouth daily.    Cholecalciferol 25 MCG (1000 UT) capsule Take 1,000 Units by mouth daily.   dicyclomine (BENTYL) 10 MG capsule Take 10 mg by mouth as needed  for spasms (IBS).   diphenoxylate-atropine (LOMOTIL) 2.5-0.025 MG tablet Take 1 tablet by mouth as needed for diarrhea or loose stools.   donepezil (ARICEPT) 10 MG tablet Take 10 mg by mouth at bedtime.   ergocalciferol (VITAMIN D2) 50000 units capsule Take 50,000 Units by mouth once a week.   furosemide (LASIX) 20 MG tablet Take 20 mg by mouth daily.   HYDROcodone-acetaminophen (NORCO) 7.5-325 MG tablet Take 1 tablet by mouth every 6 (six) hours as needed for moderate pain. 0.5 tablet as needed for bursitis and [place]   memantine (NAMENDA) 5 MG tablet Take 5 mg by mouth 2 (two) times daily.   metoprolol succinate (TOPROL-XL) 25 MG 24 hr tablet TAKE 1 TABLET BY MOUTH EVERY DAY (Patient taking differently: Take 25 mg by mouth daily.)   montelukast (SINGULAIR) 10 MG tablet Take 10 mg by mouth at bedtime.   ondansetron (ZOFRAN) 4 MG tablet Take 4 mg by mouth every 8 (eight) hours as needed for nausea or vomiting.   tamsulosin (FLOMAX) 0.4 MG CAPS capsule    Tiotropium Bromide-Olodaterol 2.5-2.5 MCG/ACT AERS Inhale 2 puffs into the lungs 1 day or 1 dose.   vitamin B-12 (CYANOCOBALAMIN) 1000 MCG tablet Take 1,000 mcg by mouth daily.   No current facility-administered medications for this visit. (Other)   REVIEW OF SYSTEMS: ROS   Positive for: Neurological, Skin, Musculoskeletal, Cardiovascular,  Eyes, Respiratory Negative for: Constitutional, Gastrointestinal, Genitourinary, HENT, Endocrine, Psychiatric, Allergic/Imm, Heme/Lymph Last edited by Roselee Nova D, COT on 12/25/2020  1:00 PM.      ALLERGIES Allergies  Allergen Reactions   Clarithromycin Diarrhea and Other (See Comments)   Prednisone Other (See Comments)    Passed out   Sertraline Diarrhea   Sertraline Hcl Diarrhea   PAST MEDICAL HISTORY Past Medical History:  Diagnosis Date   Acute deep vein thrombosis (DVT) of left peroneal vein (HCC) 04/11/2019   Alzheimer's dementia ([place])    Cervical spondylosis 03/18/2018   Chronic  bilateral low back pain without sciatica 03/18/2018   Chronic foot pain, left 03/14/2019   Formatting of this note might be different from the original. Possible fracture base 2nd metatarsal   Chronic neck pain 03/18/2018   COPD (chronic obstructive pulmonary disease) ([place])    Dr. Delena Bali   DDD (degenerative disc disease), cervical 03/18/2018   Disp fx of fifth metatarsal bone, r foot, init for opn fx    DVT (deep venous thrombosis) ([place]) 03/2020   DVT femoral (deep venous thrombosis) with thrombophlebitis, left ([place]) 04/11/2019   Dyspnea on exertion 01/27/2018   Facet arthritis, degenerative, lumbar spine 03/18/2018   Family history of malignant neoplasm of breast    Generalized anxiety disorder 01/24/2019   Possible history of panic attacks   Greater trochanteric bursitis of both hips 04/10/2018   Heart murmur 01/27/2018   History of breast cancer 03/18/2018   S/p bilateral mastectomy  Formatting of this note might be different from the original. S/p bilateral mastectomy   History of deep vein thrombosis (DVT) of lower extremity 10/02/2019   Left leg swelling 02/23/2019   Lumbar spondylosis 03/18/2018   Malignant neoplasm of breast (female), unspecified site    Malignant neoplasm of central portion of left female breast ([place])    Mitral valve prolapse 01/27/2018   Obstructive sleep apnea    Variable CPAP use.   Other idiopathic scoliosis, lumbar region 03/18/2018   Pain of left calf 03/14/2019   Palpitations 01/25/2018   Pes planus of left foot 02/23/2019   Skin cancer    basal cell   Sprain of tarsometatarsal ligament of right foot 07/14/2019   Stasis dermatitis, acute 10/03/2019   Tenosynovitis of left ankle 04/11/2019   TIA (transient ischemic attack) 01/17/2019   Urge incontinence 07/28/2019   Varicose veins of left lower extremity    Ventricular tachycardia 02/28/2019   Past Surgical History:  Procedure Laterality Date   BUNIONECTOMY Right 1999   MASTECTOMY Bilateral  2012   [place]   SKIN CANCER EXCISION  2018   Top of head    SKIN CANCER EXCISION  07/2019   Nose   TENDON TRANSFER Right 1999   foot    FAMILY HISTORY Family History  Problem Relation Age of Onset   Lymphoma Mother 80       deceased 33   Breast cancer Maternal Aunt        Dx 29s; Deceased 71s   Lung cancer Maternal Uncle    Stomach cancer Paternal Uncle        deceased 59   Liver cancer Maternal Uncle    Prostate cancer Other        3 mat cousins   Breast cancer Other 16       mother's mat half-sister   Emphysema Father     SOCIAL HISTORY Social History   Tobacco Use   Smoking status:  Never   Smokeless tobacco: Never  Vaping Use   Vaping Use: Never used  Substance Use Topics   Alcohol use: Not Currently   Drug use: Never       OPHTHALMIC EXAM: Base Eye Exam     Visual Acuity (Snellen - Linear)       Right Left   Dist [place] 20/20 -2 20/25 -2   Dist ph [place]  NI         Tonometry (Tonopen, 1:03 PM)       Right Left   Pressure 14 18         Pupils       Dark Light Shape React APD   Right 2 1.5 Round Minimal None   Left 2 1.5 Round Minimal None         Visual Fields (Counting fingers)       Left Right    Full Full         Extraocular Movement       Right Left    Full, Ortho Full, Ortho         Neuro/Psych     Oriented x3: Yes   Mood/Affect: Normal         Dilation     Both eyes: 1.0% Mydriacyl, 2.5% Phenylephrine @ 1:03 PM           Slit Lamp and Fundus Exam     External Exam       Right Left   External Normal Normal         Slit Lamp Exam       Right Left   Lids/Lashes Dermatochalasis - upper lid, Ptosis UL Dermatochalasis - upper lid, Ptosis UL   Conjunctiva/Sclera White and quiet White and quiet   Cornea trace PEE trace PEE   Anterior Chamber Deep and quiet Deep and quiet   Iris Round and dilated Round and dilated   Lens 2+ Nuclear sclerosis, 2+ Cortical cataract 2+ Nuclear  sclerosis, 2+ Cortical cataract   Vitreous Mild syneresis, Posterior vitreous detachment, mild vitreous condensations Mild syneresis, Posterior vitreous detachment, ? Faint blood-stained vitreous condensations         Fundus Exam       Right Left   Disc Pink and Sharp, +cupping Pink and Sharp   C/D Ratio 0.65 0.6   Macula Flat, Blunted foveal reflex, RPE mottling, rare, fine drusen, No heme or edema Flat, Blunted foveal reflex, RPE mottling, rare, fine drusen, No heme or edema   Vessels mild attenuation, mild tortuousity mild attenuation, mild tortuousity   Periphery Attached, mild reticular degeneration, No heme  Attached, mild reticular degeneration, focal pigmented CR scars with VR tuft at 0130 -- good early laser changes w/ some mild IRH, No new RT/RD            Refraction     Wearing Rx     Type: Readers           IMAGING AND PROCEDURES  Imaging and Procedures for 12/25/2020  OCT, Retina - OU - Both Eyes       Right Eye Quality was good. Central Foveal Thickness: 259. Progression has been stable. Findings include normal foveal contour, no IRF, no SRF, retinal drusen .   Left Eye Quality was good. Central Foveal Thickness: 262. Progression has been stable. Findings include normal foveal contour, no IRF, no SRF (Mild drusen, mild vitreous opacities ).   Notes *Images captured and stored on drive  Diagnosis / Impression:  NFP, no IRF/SRF OU Mild vit opacities OS  Clinical management:  See below  Abbreviations: NFP - Normal foveal profile. CME - cystoid macular edema. PED - pigment epithelial detachment. IRF - intraretinal fluid. SRF - subretinal fluid. EZ - ellipsoid zone. ERM - epiretinal membrane. ORA - outer retinal atrophy. ORT - outer retinal tubulation. SRHM - subretinal hyper-reflective material. IRHM - intraretinal hyper-reflective material             ASSESSMENT/PLAN:    ICD-10-CM   1. Vitreoretinal tuft of left eye  Q14.1     2. Left  retinal defect  H33.302     3. Retinal edema  H35.81 OCT, Retina - OU - Both Eyes    4. Posterior vitreous detachment of both eyes  H43.813     5. Early dry stage nonexudative age-related macular degeneration of both eyes  H35.3131     6. Essential hypertension  I10     7. Hypertensive retinopathy of both eyes  H35.033     8. Combined forms of age-related cataract of both eyes  H25.813      **Problem visit today, 11.15.22.  Seeing a "blob" in vision OS** - Mild blood-stained vitreous opacities -- suspect mild bleeding into vit cavity from laser retinopexy - No new active bleeding on exam today. No new RT/RD   - No acute retinal intervention recommended  - recommend VH precautions: minimize activities, keep head elevated, avoid ASA/NSAIDs/blood thinners as able  - f/u as scheduled  1-3. Pigmented CR scar with VR tuft OS  - pt with h/o PVD OS and persistent flashes/floaters  - exam shows pigmented CR scar with VR tuft at 0100  - laser retinopexy OS 11.08.22, good early laser changes today, 11.15.22  - new floaters post laser as above  - no new RT/RD - f/u as scheduled in 4-6 weeks, DFE, OCT  4. PVD / vitreous syneresis OU  - symptomatic flashes/floaters -- improving  - Discussed findings and prognosis  - No RT or RD on 360 scleral depressed exam  - Reviewed s/s of RT/RD  - Strict return precautions for any such RT/RD signs/symptoms  5. Age related macular degeneration, non-exudative, both eyes  - early stage with early fine drusen  - The incidence, anatomy, and pathology of dry AMD, risk of progression, and the AREDS and AREDS 2 study including smoking risks discussed with patient.  - Recommend amsler grid monitoring  - f/u 3 months  6,7. Hypertensive retinopathy OU - discussed importance of tight BP control - monitor  8. Mixed Cataract OU - The symptoms of cataract, surgical options, and treatments and risks were discussed with patient. - discussed diagnosis and  progression - not yet visually significant - monitor for now  Ophthalmic Meds Ordered this visit:  No orders of the defined types were placed in this encounter.    Return for Return as scheduled.  There are no Patient Instructions on file for this visit.  This document serves as a record of services personally performed by Gardiner Sleeper, MD, PhD. It was created on their behalf by Estill Bakes, COT an ophthalmic technician. The creation of this record is the provider's dictation and/or activities during the visit.    Electronically signed by: Estill Bakes, [place] 11.15.22 @ 9:42 PM   This document serves as a record of services personally performed by Gardiner Sleeper, MD, PhD. It was created on their behalf by Leonie Douglas, an ophthalmic technician. The creation of this record is  the provider's dictation and/or activities during the visit.    Electronically signed by: Leonie Douglas COA, 12/25/20  9:42 PM   Gardiner Sleeper, M.D., Ph.D. Diseases & Surgery of the Retina and Vitreous [facility] 12/25/2020  I have reviewed the above documentation for accuracy and completeness, and I agree with the above. Gardiner Sleeper, M.D., Ph.D. 12/25/20 9:45 PM   Abbreviations: M myopia (nearsighted); A astigmatism; H hyperopia (farsighted); P presbyopia; Mrx spectacle prescription;  CTL contact lenses; OD right eye; OS left eye; OU both eyes  XT exotropia; ET esotropia; PEK punctate epithelial keratitis; PEE punctate epithelial erosions; DES dry eye syndrome; MGD meibomian gland dysfunction; ATs artificial tears; PFAT's preservative free artificial tears; [place] nuclear sclerotic cataract; PSC posterior subcapsular cataract; ERM epi-retinal membrane; PVD posterior vitreous detachment; RD retinal detachment; DM diabetes mellitus; DR diabetic retinopathy; NPDR non-proliferative diabetic retinopathy; PDR proliferative diabetic retinopathy; CSME clinically significant macular edema; DME  diabetic macular edema; dbh dot blot hemorrhages; CWS cotton wool spot; POAG primary open angle glaucoma; C/D cup-to-disc ratio; HVF humphrey visual field; GVF goldmann visual field; OCT optical coherence tomography; IOP intraocular pressure; BRVO Branch retinal vein occlusion; CRVO central retinal vein occlusion; CRAO central retinal artery occlusion; BRAO branch retinal artery occlusion; RT retinal tear; SB scleral buckle; PPV pars plana vitrectomy; VH Vitreous hemorrhage; PRP panretinal laser photocoagulation; IVK intravitreal kenalog; VMT vitreomacular traction; MH Macular hole;  NVD neovascularization of the disc; NVE neovascularization elsewhere; AREDS age related eye disease study; ARMD age related macular degeneration; POAG primary open angle glaucoma; EBMD epithelial/anterior basement membrane dystrophy; ACIOL anterior chamber intraocular lens; IOL intraocular lens; PCIOL posterior chamber intraocular lens; Phaco/IOL phacoemulsification with intraocular lens placement; [place] photorefractive keratectomy; LASIK laser assisted in situ keratomileusis; HTN hypertension; DM diabetes mellitus; COPD chronic obstructive pulmonary disease

## 2020-12-27 DIAGNOSIS — M5136 Other intervertebral disc degeneration, lumbar region: Secondary | ICD-10-CM | POA: Diagnosis not present

## 2020-12-27 DIAGNOSIS — G8929 Other chronic pain: Secondary | ICD-10-CM | POA: Diagnosis not present

## 2020-12-27 DIAGNOSIS — R531 Weakness: Secondary | ICD-10-CM | POA: Diagnosis not present

## 2020-12-27 DIAGNOSIS — M545 Low back pain, unspecified: Secondary | ICD-10-CM | POA: Diagnosis not present

## 2020-12-27 DIAGNOSIS — M5030 Other cervical disc degeneration, unspecified cervical region: Secondary | ICD-10-CM | POA: Diagnosis not present

## 2020-12-27 DIAGNOSIS — R2681 Unsteadiness on feet: Secondary | ICD-10-CM | POA: Diagnosis not present

## 2020-12-31 DIAGNOSIS — R531 Weakness: Secondary | ICD-10-CM | POA: Diagnosis not present

## 2020-12-31 DIAGNOSIS — I952 Hypotension due to drugs: Secondary | ICD-10-CM | POA: Diagnosis not present

## 2021-01-08 DIAGNOSIS — G8929 Other chronic pain: Secondary | ICD-10-CM | POA: Diagnosis not present

## 2021-01-08 DIAGNOSIS — M545 Low back pain, unspecified: Secondary | ICD-10-CM | POA: Diagnosis not present

## 2021-01-08 DIAGNOSIS — R531 Weakness: Secondary | ICD-10-CM | POA: Diagnosis not present

## 2021-01-08 DIAGNOSIS — M5030 Other cervical disc degeneration, unspecified cervical region: Secondary | ICD-10-CM | POA: Diagnosis not present

## 2021-01-08 DIAGNOSIS — R2681 Unsteadiness on feet: Secondary | ICD-10-CM | POA: Diagnosis not present

## 2021-01-08 DIAGNOSIS — M5136 Other intervertebral disc degeneration, lumbar region: Secondary | ICD-10-CM | POA: Diagnosis not present

## 2021-01-10 DIAGNOSIS — M7062 Trochanteric bursitis, left hip: Secondary | ICD-10-CM | POA: Diagnosis not present

## 2021-01-10 DIAGNOSIS — M47816 Spondylosis without myelopathy or radiculopathy, lumbar region: Secondary | ICD-10-CM | POA: Diagnosis not present

## 2021-01-10 DIAGNOSIS — M7061 Trochanteric bursitis, right hip: Secondary | ICD-10-CM | POA: Diagnosis not present

## 2021-01-10 DIAGNOSIS — M5030 Other cervical disc degeneration, unspecified cervical region: Secondary | ICD-10-CM | POA: Diagnosis not present

## 2021-01-10 DIAGNOSIS — M47812 Spondylosis without myelopathy or radiculopathy, cervical region: Secondary | ICD-10-CM | POA: Diagnosis not present

## 2021-01-10 DIAGNOSIS — M5136 Other intervertebral disc degeneration, lumbar region: Secondary | ICD-10-CM | POA: Diagnosis not present

## 2021-01-11 DIAGNOSIS — Z8673 Personal history of transient ischemic attack (TIA), and cerebral infarction without residual deficits: Secondary | ICD-10-CM | POA: Diagnosis not present

## 2021-01-11 DIAGNOSIS — G8929 Other chronic pain: Secondary | ICD-10-CM | POA: Diagnosis not present

## 2021-01-11 DIAGNOSIS — R293 Abnormal posture: Secondary | ICD-10-CM | POA: Diagnosis not present

## 2021-01-11 DIAGNOSIS — M545 Low back pain, unspecified: Secondary | ICD-10-CM | POA: Diagnosis not present

## 2021-01-11 DIAGNOSIS — R2681 Unsteadiness on feet: Secondary | ICD-10-CM | POA: Diagnosis not present

## 2021-01-11 DIAGNOSIS — M5136 Other intervertebral disc degeneration, lumbar region: Secondary | ICD-10-CM | POA: Diagnosis not present

## 2021-01-11 DIAGNOSIS — M5030 Other cervical disc degeneration, unspecified cervical region: Secondary | ICD-10-CM | POA: Diagnosis not present

## 2021-01-11 NOTE — Progress Notes (Signed)
[facility] Note  01/15/2021     CHIEF COMPLAINT Patient presents for Retina Follow Up  HISTORY OF PRESENT ILLNESS: Karen Shannon is a 70 y.o. female who presents to the clinic today for:   HPI     Retina Follow Up   Patient presents with  Other.  In left eye.  This started 3 weeks ago.  I, the attending physician,  performed the HPI with the patient and updated documentation appropriately.        Comments   Patient here for 3 weeks retina follow up for VR tuft OS. Patient states vision doing much better. The fog has cleared up. Still has flashes in OU. Notices at night in dark room or driving at night. No eye pain. Does physical therapy twice a week for stroke had in June. Gets kenalog shots every 3 months. In January will start shots in spine for pain.      Last edited by Bernarda Caffey, MD on 01/16/2021 11:31 PM.    Pt states she is still seeing fol OU, she has seen them since February or April, he states they have decreased in frequency since then   Referring physician: Nicoletta Dress, [place],  [place] [address]  HISTORICAL INFORMATION:  Selected notes from the [facility] Referred by Dr. Kathlen Mody for eval of PVD OU LEE: 10.20.2022 Ocular Hx-PVD OU, cataracts, glc suspect   CURRENT MEDICATIONS: No current outpatient medications on file. (Ophthalmic Drugs)   No current facility-administered medications for this visit. (Ophthalmic Drugs)   Current Outpatient Medications (Other)  Medication Sig   ascorbic acid (VITAMIN C) 500 MG tablet Take 500 mg by mouth daily.   aspirin 81 MG chewable tablet Chew 81 mg by mouth daily.   atorvastatin (LIPITOR) 40 MG tablet Take 40 mg by mouth daily.    Cholecalciferol 25 MCG (1000 UT) capsule Take 1,000 Units by mouth daily.   dicyclomine (BENTYL) 10 MG capsule Take 10 mg by mouth as needed for spasms (IBS).   diphenoxylate-atropine (LOMOTIL) 2.5-0.025  MG tablet Take 1 tablet by mouth as needed for diarrhea or loose stools.   donepezil (ARICEPT) 10 MG tablet Take 10 mg by mouth at bedtime.   ergocalciferol (VITAMIN D2) 50000 units capsule Take 50,000 Units by mouth once a week.   furosemide (LASIX) 20 MG tablet Take 20 mg by mouth daily.   HYDROcodone-acetaminophen (NORCO) 7.5-325 MG tablet Take 1 tablet by mouth every 6 (six) hours as needed for moderate pain. 0.5 tablet as needed for bursitis and [place]   memantine (NAMENDA) 5 MG tablet Take 5 mg by mouth 2 (two) times daily.   metoprolol succinate (TOPROL-XL) 25 MG 24 hr tablet TAKE 1 TABLET BY MOUTH EVERY DAY (Patient taking differently: Take 25 mg by mouth daily.)   montelukast (SINGULAIR) 10 MG tablet Take 10 mg by mouth at bedtime.   ondansetron (ZOFRAN) 4 MG tablet Take 4 mg by mouth every 8 (eight) hours as needed for nausea or vomiting.   tamsulosin (FLOMAX) 0.4 MG CAPS capsule    Tiotropium Bromide-Olodaterol 2.5-2.5 MCG/ACT AERS Inhale 2 puffs into the lungs 1 day or 1 dose.   vitamin B-12 (CYANOCOBALAMIN) 1000 MCG tablet Take 1,000 mcg by mouth daily.   No current facility-administered medications for this visit. (Other)   REVIEW OF SYSTEMS: ROS   Positive for: Neurological, Skin, Musculoskeletal, Cardiovascular, Eyes, Respiratory Negative for: Constitutional, Gastrointestinal, Genitourinary, HENT,  Endocrine, Psychiatric, Allergic/Imm, Heme/Lymph Last edited by Theodore Demark, COA on 01/15/2021  1:20 PM.     ALLERGIES Allergies  Allergen Reactions   Clarithromycin Diarrhea and Other (See Comments)   Prednisone Other (See Comments)    Passed out   Sertraline Diarrhea   Sertraline Hcl Diarrhea   PAST MEDICAL HISTORY Past Medical History:  Diagnosis Date   Acute deep vein thrombosis (DVT) of left peroneal vein (HCC) 04/11/2019   Alzheimer's dementia ([place])    Cervical spondylosis 03/18/2018   Chronic bilateral low back pain without sciatica 03/18/2018   Chronic foot  pain, left 03/14/2019   Formatting of this note might be different from the original. Possible fracture base 2nd metatarsal   Chronic neck pain 03/18/2018   COPD (chronic obstructive pulmonary disease) ([place])    Dr. Delena Bali   DDD (degenerative disc disease), cervical 03/18/2018   Disp fx of fifth metatarsal bone, r foot, init for opn fx    DVT (deep venous thrombosis) ([place]) 03/2020   DVT femoral (deep venous thrombosis) with thrombophlebitis, left ([place]) 04/11/2019   Dyspnea on exertion 01/27/2018   Facet arthritis, degenerative, lumbar spine 03/18/2018   Family history of malignant neoplasm of breast    Generalized anxiety disorder 01/24/2019   Possible history of panic attacks   Greater trochanteric bursitis of both hips 04/10/2018   Heart murmur 01/27/2018   History of breast cancer 03/18/2018   S/p bilateral mastectomy  Formatting of this note might be different from the original. S/p bilateral mastectomy   History of deep vein thrombosis (DVT) of lower extremity 10/02/2019   Left leg swelling 02/23/2019   Lumbar spondylosis 03/18/2018   Malignant neoplasm of breast (female), unspecified site    Malignant neoplasm of central portion of left female breast ([place])    Mitral valve prolapse 01/27/2018   Obstructive sleep apnea    Variable CPAP use.   Other idiopathic scoliosis, lumbar region 03/18/2018   Pain of left calf 03/14/2019   Palpitations 01/25/2018   Pes planus of left foot 02/23/2019   Skin cancer    basal cell   Sprain of tarsometatarsal ligament of right foot 07/14/2019   Stasis dermatitis, acute 10/03/2019   Tenosynovitis of left ankle 04/11/2019   TIA (transient ischemic attack) 01/17/2019   Urge incontinence 07/28/2019   Varicose veins of left lower extremity    Ventricular tachycardia 02/28/2019   Past Surgical History:  Procedure Laterality Date   BUNIONECTOMY Right 1999   MASTECTOMY Bilateral 2012   [place]   SKIN CANCER EXCISION  2018    Top of head    SKIN CANCER EXCISION  07/2019   Nose   TENDON TRANSFER Right 1999   foot    FAMILY HISTORY Family History  Problem Relation Age of Onset   Lymphoma Mother 12       deceased 77   Breast cancer Maternal Aunt        Dx 41s; Deceased 7s   Lung cancer Maternal Uncle    Stomach cancer Paternal Uncle        deceased 63   Liver cancer Maternal Uncle    Prostate cancer Other        3 mat cousins   Breast cancer Other 74       mother's mat half-sister   Emphysema Father     SOCIAL HISTORY Social History   Tobacco Use   Smoking status: Never   Smokeless tobacco: Never  Vaping Use  Vaping Use: Never used  Substance Use Topics   Alcohol use: Not Currently   Drug use: Never       OPHTHALMIC EXAM: Base Eye Exam     Visual Acuity (Snellen - Linear)       Right Left   Dist [place] 20/20 -2 20/25 +2   Dist ph [place]  NI         Tonometry (Tonopen, 1:14 PM)       Right Left   Pressure 22 21         Pupils       Dark Light Shape React APD   Right 2 1.5 Round Minimal None   Left 2 1.5 Round Minimal None         Visual Fields (Counting fingers)       Left Right    Full Full         Extraocular Movement       Right Left    Full, Ortho Full, Ortho         Neuro/Psych     Oriented x3: Yes   Mood/Affect: Normal         Dilation     Both eyes: 1.0% Mydriacyl, 2.5% Phenylephrine @ 1:14 PM           Slit Lamp and Fundus Exam     External Exam       Right Left   External Normal Normal         Slit Lamp Exam       Right Left   Lids/Lashes Dermatochalasis - upper lid, Ptosis UL Dermatochalasis - upper lid, Ptosis UL   Conjunctiva/Sclera White and quiet White and quiet   Cornea trace PEE trace PEE   Anterior Chamber Deep and quiet Deep and quiet   Iris Round and dilated Round and dilated   Lens 2+ Nuclear sclerosis, 2+ Cortical cataract 2+ Nuclear sclerosis, 2+ Cortical cataract   Anterior Vitreous Mild syneresis,  Posterior vitreous detachment, mild vitreous condensations Mild syneresis, Posterior vitreous detachment, Faint blood-stained vitreous condensations improved         Fundus Exam       Right Left   Disc Pink and Sharp, +cupping Pink and Sharp   C/D Ratio 0.65 0.6   Macula Flat, Blunted foveal reflex, RPE mottling, rare, fine drusen, No heme or edema Flat, Blunted foveal reflex, RPE mottling, rare, fine drusen, No heme or edema   Vessels mild attenuation, mild tortuousity mild attenuation, mild tortuousity   Periphery Attached, mild reticular degeneration, No heme Attached, mild reticular degeneration, focal pigmented CR scars with VR tuft at 0130 -- good laser changes, No new RT/RD           Refraction     Wearing Rx     Type: Readers           IMAGING AND PROCEDURES  Imaging and Procedures for 01/15/2021  OCT, Retina - OU - Both Eyes       Right Eye Quality was good. Central Foveal Thickness: 259. Progression has been stable. Findings include normal foveal contour, no IRF, no SRF, retinal drusen .   Left Eye Quality was good. Central Foveal Thickness: 268. Progression has been stable. Findings include normal foveal contour, no IRF, no SRF (Mild drusen, mild vitreous opacities -- improved).   Notes *Images captured and stored on drive  Diagnosis / Impression:  NFP, no IRF/SRF OU Mild vit opacities OS -- improved  Clinical management:  See  below  Abbreviations: NFP - Normal foveal profile. CME - cystoid macular edema. PED - pigment epithelial detachment. IRF - intraretinal fluid. SRF - subretinal fluid. EZ - ellipsoid zone. ERM - epiretinal membrane. ORA - outer retinal atrophy. ORT - outer retinal tubulation. SRHM - subretinal hyper-reflective material. IRHM - intraretinal hyper-reflective material            ASSESSMENT/PLAN:    ICD-10-CM   1. Vitreoretinal tuft of left eye  Q14.1     2. Left retinal defect  H33.302     3. Retinal edema  H35.81 OCT,  Retina - OU - Both Eyes    4. Posterior vitreous detachment of both eyes  H43.813     5. Early dry stage nonexudative age-related macular degeneration of both eyes  H35.3131     6. Essential hypertension  I10     7. Hypertensive retinopathy of both eyes  H35.033     8. Combined forms of age-related cataract of both eyes  H25.813      1-3. Pigmented CR scar with VR tuft OS  - pt with h/o PVD OS and persistent flashes/floaters  - exam shows pigmented CR scar with VR tuft at 0100  - laser retinopexy OS 11.08.22 -- good early laser changes in place  - no new RT/RD - f/u 6-9 months, DFE, OCT  4. PVD / vitreous syneresis OU  - symptomatic flashes/floaters -- improving  - Discussed findings and prognosis  - No RT or RD on 360 scleral depressed exam  - Reviewed s/s of RT/RD  - Strict return precautions for any such RT/RD signs/symptoms  5. Age related macular degeneration, non-exudative, both eyes  - early stage with early fine drusen  - The incidence, anatomy, and pathology of dry AMD, risk of progression, and the AREDS and AREDS 2 study including smoking risks discussed with patient.  - Recommend amsler grid monitoring  - f/u 6-9 months  6,7. Hypertensive retinopathy OU - discussed importance of tight BP control - monitor  8. Mixed Cataract OU - The symptoms of cataract, surgical options, and treatments and risks were discussed with patient. - discussed diagnosis and progression - not yet visually significant - monitor for now  Ophthalmic Meds Ordered this visit:  No orders of the defined types were placed in this encounter.    Return for f/u 6-9 non-exu ARMD OU, DFE, OCT.  There are no Patient Instructions on file for this visit.  This document serves as a record of services personally performed by Gardiner Sleeper, MD, PhD. It was created on their behalf by Estill Bakes, COT an ophthalmic technician. The creation of this record is the provider's dictation and/or  activities during the visit.    Electronically signed by: Estill Bakes, COT 12.2.22 @ 11:33 PM   This document serves as a record of services personally performed by Gardiner Sleeper, MD, PhD. It was created on their behalf by San Jetty. Owens Shark, OA an ophthalmic technician. The creation of this record is the provider's dictation and/or activities during the visit.    Electronically signed by: San Jetty. Marguerita Merles 12.06.2022 11:33 PM   Gardiner Sleeper, M.D., Ph.D. Diseases & Surgery of the Retina and Vitreous [facility]  I have reviewed the above documentation for accuracy and completeness, and I agree with the above. Gardiner Sleeper, M.D., Ph.D. 01/16/21 11:33 PM  Abbreviations: M myopia (nearsighted); A astigmatism; H hyperopia (farsighted); P presbyopia; Mrx spectacle prescription;  CTL  contact lenses; OD right eye; OS left eye; OU both eyes  XT exotropia; ET esotropia; PEK punctate epithelial keratitis; PEE punctate epithelial erosions; DES dry eye syndrome; MGD meibomian gland dysfunction; ATs artificial tears; PFAT's preservative free artificial tears; [place] nuclear sclerotic cataract; PSC posterior subcapsular cataract; ERM epi-retinal membrane; PVD posterior vitreous detachment; RD retinal detachment; DM diabetes mellitus; DR diabetic retinopathy; NPDR non-proliferative diabetic retinopathy; PDR proliferative diabetic retinopathy; CSME clinically significant macular edema; DME diabetic macular edema; dbh dot blot hemorrhages; CWS cotton wool spot; POAG primary open angle glaucoma; C/D cup-to-disc ratio; HVF humphrey visual field; GVF goldmann visual field; OCT optical coherence tomography; IOP intraocular pressure; BRVO Branch retinal vein occlusion; CRVO central retinal vein occlusion; CRAO central retinal artery occlusion; BRAO branch retinal artery occlusion; RT retinal tear; SB scleral buckle; PPV pars plana vitrectomy; VH Vitreous hemorrhage; PRP panretinal laser  photocoagulation; IVK intravitreal kenalog; VMT vitreomacular traction; MH Macular hole;  NVD neovascularization of the disc; NVE neovascularization elsewhere; AREDS age related eye disease study; ARMD age related macular degeneration; POAG primary open angle glaucoma; EBMD epithelial/anterior basement membrane dystrophy; ACIOL anterior chamber intraocular lens; IOL intraocular lens; PCIOL posterior chamber intraocular lens; Phaco/IOL phacoemulsification with intraocular lens placement; [place] photorefractive keratectomy; LASIK laser assisted in situ keratomileusis; HTN hypertension; DM diabetes mellitus; COPD chronic obstructive pulmonary disease

## 2021-01-15 ENCOUNTER — Ambulatory Visit (INDEPENDENT_AMBULATORY_CARE_PROVIDER_SITE_OTHER): Payer: Medicare Other | Admitting: Ophthalmology

## 2021-01-15 ENCOUNTER — Encounter (INDEPENDENT_AMBULATORY_CARE_PROVIDER_SITE_OTHER): Payer: Self-pay | Admitting: Ophthalmology

## 2021-01-15 ENCOUNTER — Other Ambulatory Visit: Payer: Self-pay

## 2021-01-15 DIAGNOSIS — H33302 Unspecified retinal break, left eye: Secondary | ICD-10-CM

## 2021-01-15 DIAGNOSIS — I10 Essential (primary) hypertension: Secondary | ICD-10-CM | POA: Diagnosis not present

## 2021-01-15 DIAGNOSIS — H25813 Combined forms of age-related cataract, bilateral: Secondary | ICD-10-CM | POA: Diagnosis not present

## 2021-01-15 DIAGNOSIS — H353131 Nonexudative age-related macular degeneration, bilateral, early dry stage: Secondary | ICD-10-CM

## 2021-01-15 DIAGNOSIS — H43813 Vitreous degeneration, bilateral: Secondary | ICD-10-CM | POA: Diagnosis not present

## 2021-01-15 DIAGNOSIS — H3581 Retinal edema: Secondary | ICD-10-CM

## 2021-01-15 DIAGNOSIS — H35033 Hypertensive retinopathy, bilateral: Secondary | ICD-10-CM | POA: Diagnosis not present

## 2021-01-15 DIAGNOSIS — Q141 Congenital malformation of retina: Secondary | ICD-10-CM | POA: Diagnosis not present

## 2021-01-16 ENCOUNTER — Encounter (INDEPENDENT_AMBULATORY_CARE_PROVIDER_SITE_OTHER): Payer: Self-pay | Admitting: Ophthalmology

## 2021-01-18 DIAGNOSIS — M5030 Other cervical disc degeneration, unspecified cervical region: Secondary | ICD-10-CM | POA: Diagnosis not present

## 2021-01-18 DIAGNOSIS — M545 Low back pain, unspecified: Secondary | ICD-10-CM | POA: Diagnosis not present

## 2021-01-18 DIAGNOSIS — R2681 Unsteadiness on feet: Secondary | ICD-10-CM | POA: Diagnosis not present

## 2021-01-18 DIAGNOSIS — G8929 Other chronic pain: Secondary | ICD-10-CM | POA: Diagnosis not present

## 2021-01-18 DIAGNOSIS — M5136 Other intervertebral disc degeneration, lumbar region: Secondary | ICD-10-CM | POA: Diagnosis not present

## 2021-01-18 DIAGNOSIS — Z8673 Personal history of transient ischemic attack (TIA), and cerebral infarction without residual deficits: Secondary | ICD-10-CM | POA: Diagnosis not present

## 2021-01-21 DIAGNOSIS — R0781 Pleurodynia: Secondary | ICD-10-CM | POA: Diagnosis not present

## 2021-01-22 DIAGNOSIS — Z8673 Personal history of transient ischemic attack (TIA), and cerebral infarction without residual deficits: Secondary | ICD-10-CM | POA: Diagnosis not present

## 2021-01-22 DIAGNOSIS — R2681 Unsteadiness on feet: Secondary | ICD-10-CM | POA: Diagnosis not present

## 2021-01-22 DIAGNOSIS — M5030 Other cervical disc degeneration, unspecified cervical region: Secondary | ICD-10-CM | POA: Diagnosis not present

## 2021-01-22 DIAGNOSIS — M545 Low back pain, unspecified: Secondary | ICD-10-CM | POA: Diagnosis not present

## 2021-01-22 DIAGNOSIS — M5136 Other intervertebral disc degeneration, lumbar region: Secondary | ICD-10-CM | POA: Diagnosis not present

## 2021-01-22 DIAGNOSIS — G8929 Other chronic pain: Secondary | ICD-10-CM | POA: Diagnosis not present

## 2021-01-25 DIAGNOSIS — M5136 Other intervertebral disc degeneration, lumbar region: Secondary | ICD-10-CM | POA: Diagnosis not present

## 2021-01-25 DIAGNOSIS — M545 Low back pain, unspecified: Secondary | ICD-10-CM | POA: Diagnosis not present

## 2021-01-25 DIAGNOSIS — M5030 Other cervical disc degeneration, unspecified cervical region: Secondary | ICD-10-CM | POA: Diagnosis not present

## 2021-01-25 DIAGNOSIS — R2681 Unsteadiness on feet: Secondary | ICD-10-CM | POA: Diagnosis not present

## 2021-01-25 DIAGNOSIS — Z8673 Personal history of transient ischemic attack (TIA), and cerebral infarction without residual deficits: Secondary | ICD-10-CM | POA: Diagnosis not present

## 2021-01-25 DIAGNOSIS — G8929 Other chronic pain: Secondary | ICD-10-CM | POA: Diagnosis not present

## 2021-01-30 DIAGNOSIS — M545 Low back pain, unspecified: Secondary | ICD-10-CM | POA: Diagnosis not present

## 2021-01-30 DIAGNOSIS — G8929 Other chronic pain: Secondary | ICD-10-CM | POA: Diagnosis not present

## 2021-01-30 DIAGNOSIS — Z8673 Personal history of transient ischemic attack (TIA), and cerebral infarction without residual deficits: Secondary | ICD-10-CM | POA: Diagnosis not present

## 2021-01-30 DIAGNOSIS — M5136 Other intervertebral disc degeneration, lumbar region: Secondary | ICD-10-CM | POA: Diagnosis not present

## 2021-01-30 DIAGNOSIS — R2681 Unsteadiness on feet: Secondary | ICD-10-CM | POA: Diagnosis not present

## 2021-01-30 DIAGNOSIS — M5030 Other cervical disc degeneration, unspecified cervical region: Secondary | ICD-10-CM | POA: Diagnosis not present

## 2021-01-31 ENCOUNTER — Telehealth (HOSPITAL_COMMUNITY): Payer: Self-pay | Admitting: Pharmacy Technician

## 2021-02-05 DIAGNOSIS — M5030 Other cervical disc degeneration, unspecified cervical region: Secondary | ICD-10-CM | POA: Diagnosis not present

## 2021-02-05 DIAGNOSIS — G8929 Other chronic pain: Secondary | ICD-10-CM | POA: Diagnosis not present

## 2021-02-05 DIAGNOSIS — Z8673 Personal history of transient ischemic attack (TIA), and cerebral infarction without residual deficits: Secondary | ICD-10-CM | POA: Diagnosis not present

## 2021-02-05 DIAGNOSIS — M545 Low back pain, unspecified: Secondary | ICD-10-CM | POA: Diagnosis not present

## 2021-02-05 DIAGNOSIS — M5136 Other intervertebral disc degeneration, lumbar region: Secondary | ICD-10-CM | POA: Diagnosis not present

## 2021-02-05 DIAGNOSIS — R2681 Unsteadiness on feet: Secondary | ICD-10-CM | POA: Diagnosis not present

## 2021-02-06 DIAGNOSIS — M160 Bilateral primary osteoarthritis of hip: Secondary | ICD-10-CM | POA: Diagnosis not present

## 2021-02-06 DIAGNOSIS — J4540 Moderate persistent asthma, uncomplicated: Secondary | ICD-10-CM | POA: Diagnosis not present

## 2021-02-06 DIAGNOSIS — E559 Vitamin D deficiency, unspecified: Secondary | ICD-10-CM | POA: Diagnosis not present

## 2021-02-06 DIAGNOSIS — E785 Hyperlipidemia, unspecified: Secondary | ICD-10-CM | POA: Diagnosis not present

## 2021-02-06 DIAGNOSIS — R2681 Unsteadiness on feet: Secondary | ICD-10-CM | POA: Diagnosis not present

## 2021-02-06 DIAGNOSIS — R413 Other amnesia: Secondary | ICD-10-CM | POA: Diagnosis not present

## 2021-02-06 DIAGNOSIS — M19071 Primary osteoarthritis, right ankle and foot: Secondary | ICD-10-CM | POA: Diagnosis not present

## 2021-02-06 DIAGNOSIS — M5136 Other intervertebral disc degeneration, lumbar region: Secondary | ICD-10-CM | POA: Diagnosis not present

## 2021-02-06 DIAGNOSIS — M19072 Primary osteoarthritis, left ankle and foot: Secondary | ICD-10-CM | POA: Diagnosis not present

## 2021-02-06 DIAGNOSIS — Z8673 Personal history of transient ischemic attack (TIA), and cerebral infarction without residual deficits: Secondary | ICD-10-CM | POA: Diagnosis not present

## 2021-02-06 DIAGNOSIS — R002 Palpitations: Secondary | ICD-10-CM | POA: Diagnosis not present

## 2021-02-06 DIAGNOSIS — M5030 Other cervical disc degeneration, unspecified cervical region: Secondary | ICD-10-CM | POA: Diagnosis not present

## 2021-02-06 DIAGNOSIS — Z86718 Personal history of other venous thrombosis and embolism: Secondary | ICD-10-CM | POA: Diagnosis not present

## 2021-02-06 DIAGNOSIS — E0590 Thyrotoxicosis, unspecified without thyrotoxic crisis or storm: Secondary | ICD-10-CM | POA: Diagnosis not present

## 2021-02-06 DIAGNOSIS — M545 Low back pain, unspecified: Secondary | ICD-10-CM | POA: Diagnosis not present

## 2021-02-06 DIAGNOSIS — G8929 Other chronic pain: Secondary | ICD-10-CM | POA: Diagnosis not present

## 2021-02-10 DIAGNOSIS — G589 Mononeuropathy, unspecified: Secondary | ICD-10-CM | POA: Insufficient documentation

## 2021-02-10 HISTORY — DX: Mononeuropathy, unspecified: G58.9

## 2021-02-13 DIAGNOSIS — R531 Weakness: Secondary | ICD-10-CM | POA: Diagnosis not present

## 2021-02-13 DIAGNOSIS — M5030 Other cervical disc degeneration, unspecified cervical region: Secondary | ICD-10-CM | POA: Diagnosis not present

## 2021-02-13 DIAGNOSIS — G8929 Other chronic pain: Secondary | ICD-10-CM | POA: Diagnosis not present

## 2021-02-13 DIAGNOSIS — Z8673 Personal history of transient ischemic attack (TIA), and cerebral infarction without residual deficits: Secondary | ICD-10-CM | POA: Diagnosis not present

## 2021-02-13 DIAGNOSIS — M5136 Other intervertebral disc degeneration, lumbar region: Secondary | ICD-10-CM | POA: Diagnosis not present

## 2021-02-13 DIAGNOSIS — R293 Abnormal posture: Secondary | ICD-10-CM | POA: Diagnosis not present

## 2021-02-13 DIAGNOSIS — R2681 Unsteadiness on feet: Secondary | ICD-10-CM | POA: Diagnosis not present

## 2021-02-14 DIAGNOSIS — D049 Carcinoma in situ of skin, unspecified: Secondary | ICD-10-CM

## 2021-02-14 HISTORY — DX: Carcinoma in situ of skin, unspecified: D04.9

## 2021-02-16 IMAGING — US US CAROTID DUPLEX BILAT
1 series · 13 of 24 positions shown · non-contrast
Comparison: None.

CLINICAL DATA: 68-year-old female with a history of stroke

EXAM:
BILATERAL CAROTID DUPLEX ULTRASOUND
TECHNIQUE: Gray scale imaging, color Doppler and duplex ultrasound were
performed of bilateral carotid and vertebral arteries in the neck.

[Series 1: us carotid duplex bilat · 0.07mm/px · 13 of 68 slices shown]
[im 1/68]
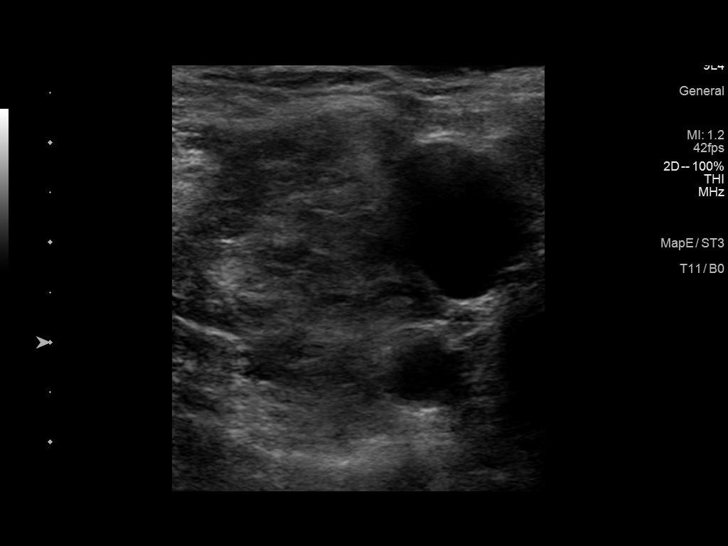
[im 6/68]
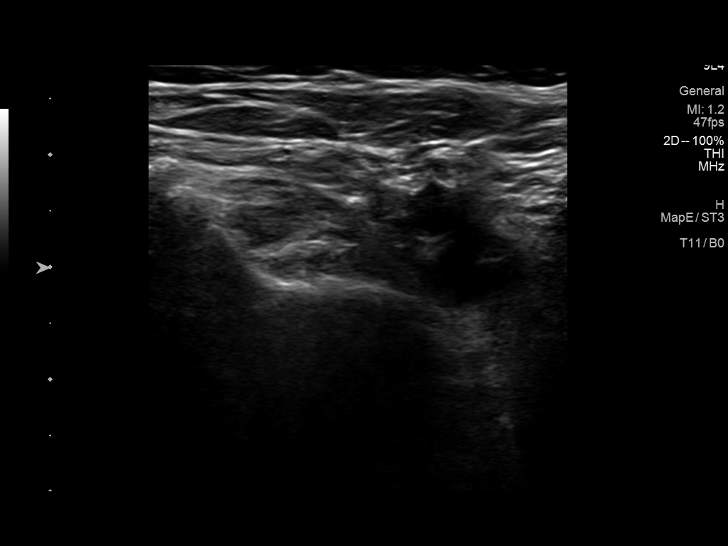
[im 12/68]
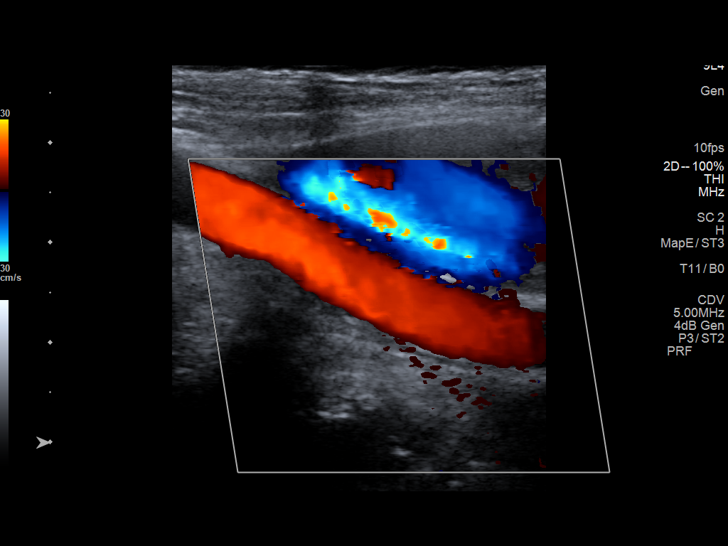
[im 18/68]
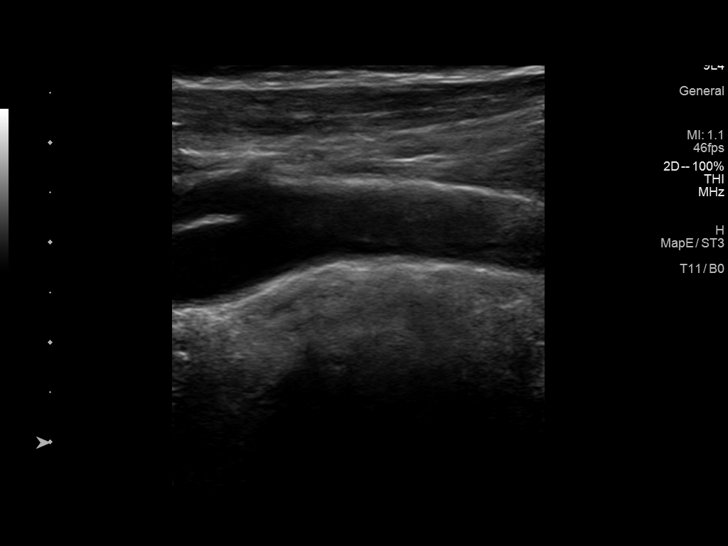
[im 24/68]
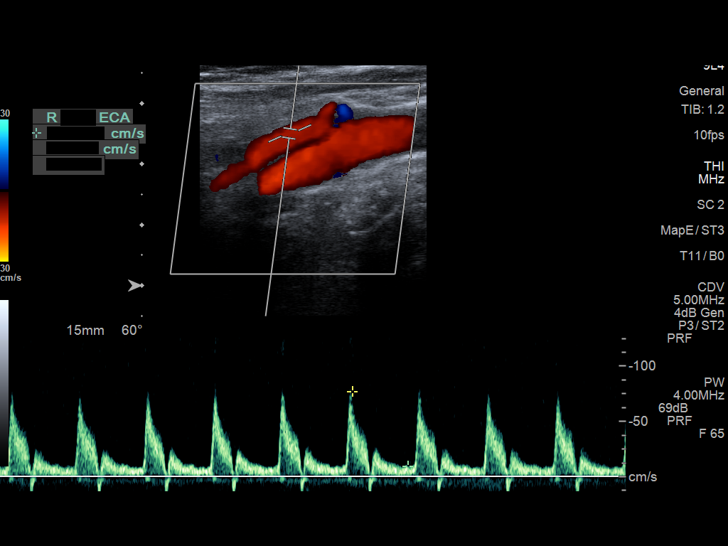
[im 30/68]
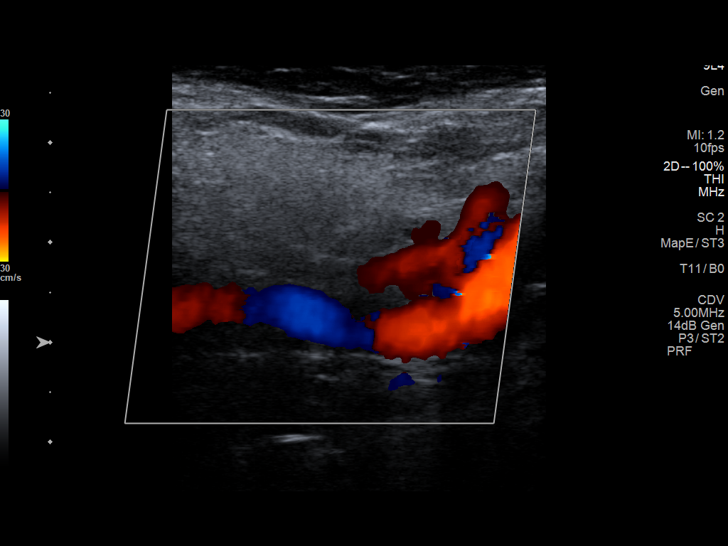
[im 35/68]
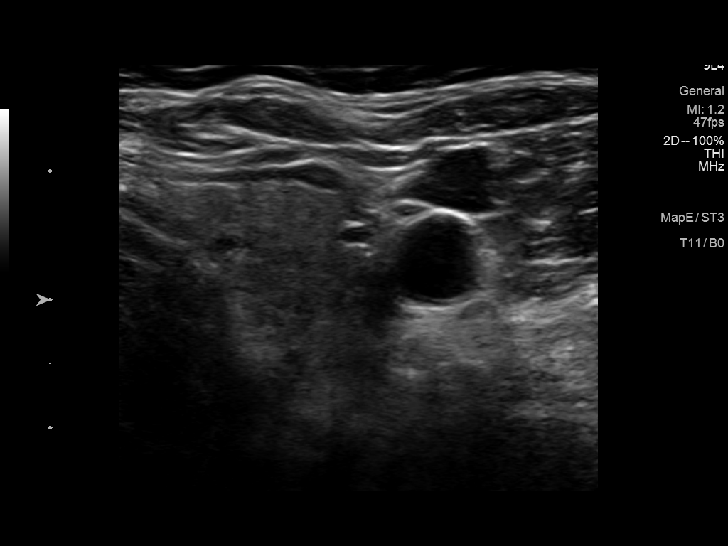
[im 38/68]
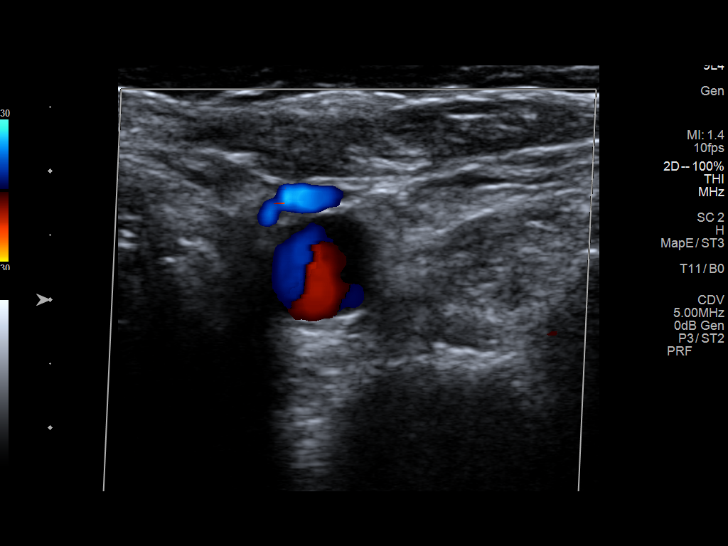
[im 44/68]
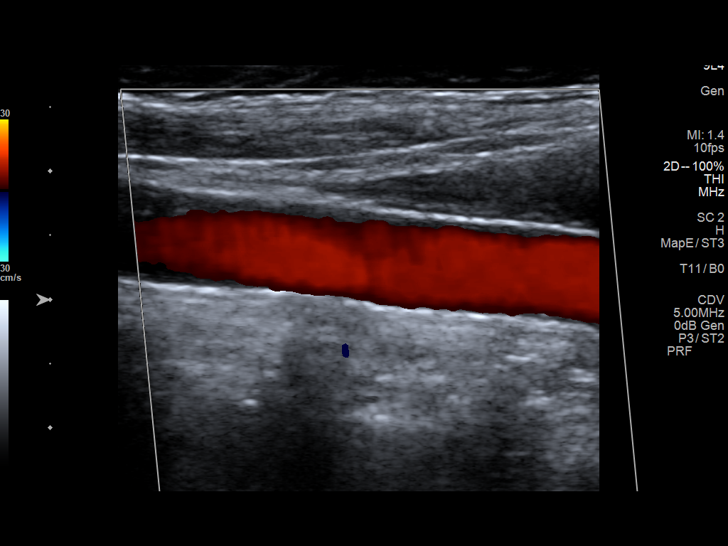
[im 50/68]
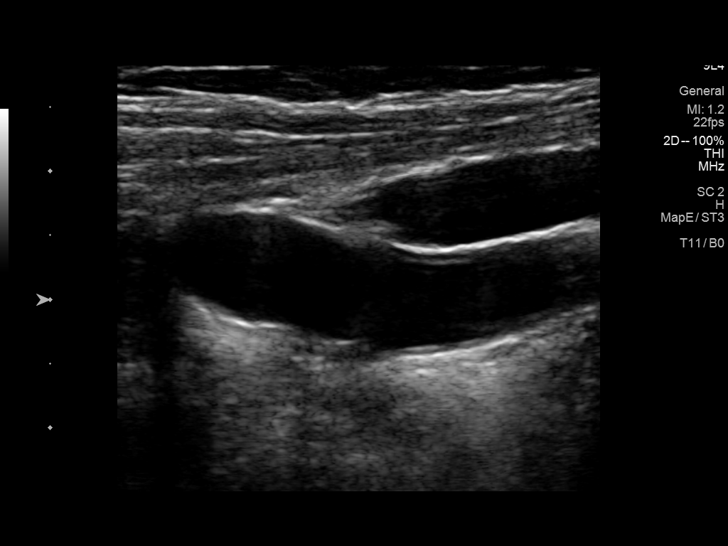
[im 56/68]
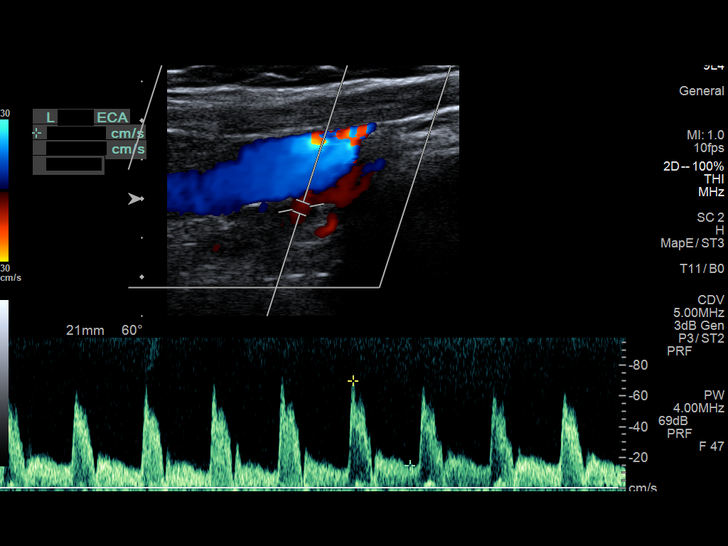
[im 62/68]
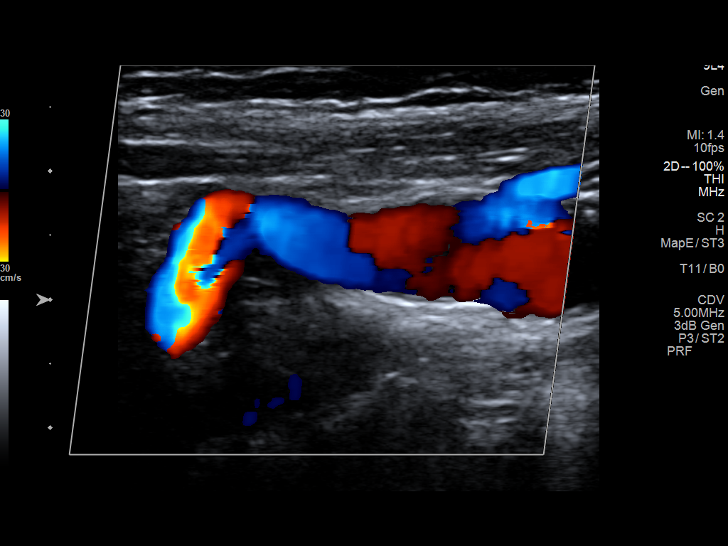
[im 68/68]
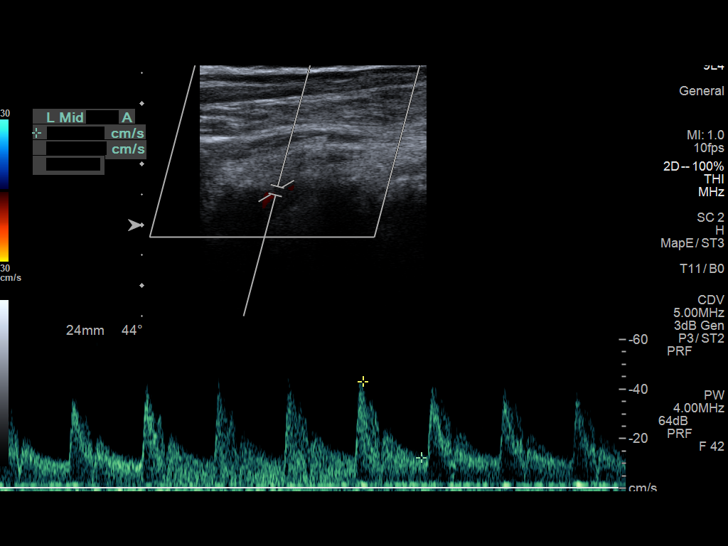

[13 of 24 positions shown; findings below may reference images not displayed]

FINDINGS: Criteria: Quantification of carotid stenosis is based on velocity
parameters that correlate the residual internal carotid diameter
with NASCET-based stenosis levels, using the diameter of the distal
internal carotid lumen as the denominator for stenosis measurement.

The following velocity measurements were obtained:

RIGHT

ICA:  Systolic 101 cm/sec, Diastolic 37 cm/sec

CCA:  127 cm/sec

SYSTOLIC ICA/CCA RATIO:

ECA:  77 cm/sec

LEFT

ICA:  Systolic 119 cm/sec, Diastolic 47 cm/sec

CCA:  109 cm/sec

SYSTOLIC ICA/CCA RATIO:

ECA:  70 cm/sec

Right Brachial SBP: 135

Left Brachial SBP: Not acquired

RIGHT CAROTID ARTERY: No significant calcified disease of the right
common carotid artery. Intermediate waveform maintained.
Heterogeneous plaque without significant calcifications at the right
carotid bifurcation. Low resistance waveform of the right ICA. No
significant tortuosity.

RIGHT VERTEBRAL ARTERY: Antegrade flow with low resistance waveform.

LEFT CAROTID ARTERY: No significant calcified disease of the left
common carotid artery. Intermediate waveform maintained.
Heterogeneous plaque at the left carotid bifurcation without
significant calcifications. Low resistance waveform of the left ICA.

LEFT VERTEBRAL ARTERY:  Antegrade flow with low resistance waveform.
IMPRESSION: Color duplex indicates minimal heterogeneous plaque, with no
hemodynamically significant stenosis by duplex criteria in the
extracranial cerebrovascular circulation.

## 2021-02-19 DIAGNOSIS — Z8673 Personal history of transient ischemic attack (TIA), and cerebral infarction without residual deficits: Secondary | ICD-10-CM | POA: Diagnosis not present

## 2021-02-19 DIAGNOSIS — R531 Weakness: Secondary | ICD-10-CM | POA: Diagnosis not present

## 2021-02-19 DIAGNOSIS — R2681 Unsteadiness on feet: Secondary | ICD-10-CM | POA: Diagnosis not present

## 2021-02-19 DIAGNOSIS — G8929 Other chronic pain: Secondary | ICD-10-CM | POA: Diagnosis not present

## 2021-02-19 DIAGNOSIS — M5030 Other cervical disc degeneration, unspecified cervical region: Secondary | ICD-10-CM | POA: Diagnosis not present

## 2021-02-19 DIAGNOSIS — M5136 Other intervertebral disc degeneration, lumbar region: Secondary | ICD-10-CM | POA: Diagnosis not present

## 2021-02-21 DIAGNOSIS — N390 Urinary tract infection, site not specified: Secondary | ICD-10-CM | POA: Diagnosis not present

## 2021-02-21 DIAGNOSIS — N135 Crossing vessel and stricture of ureter without hydronephrosis: Secondary | ICD-10-CM | POA: Diagnosis not present

## 2021-02-21 DIAGNOSIS — R351 Nocturia: Secondary | ICD-10-CM | POA: Diagnosis not present

## 2021-02-22 DIAGNOSIS — R2681 Unsteadiness on feet: Secondary | ICD-10-CM | POA: Diagnosis not present

## 2021-02-22 DIAGNOSIS — R531 Weakness: Secondary | ICD-10-CM | POA: Diagnosis not present

## 2021-02-22 DIAGNOSIS — Z8673 Personal history of transient ischemic attack (TIA), and cerebral infarction without residual deficits: Secondary | ICD-10-CM | POA: Diagnosis not present

## 2021-02-22 DIAGNOSIS — G8929 Other chronic pain: Secondary | ICD-10-CM | POA: Diagnosis not present

## 2021-02-22 DIAGNOSIS — M5136 Other intervertebral disc degeneration, lumbar region: Secondary | ICD-10-CM | POA: Diagnosis not present

## 2021-02-22 DIAGNOSIS — M5030 Other cervical disc degeneration, unspecified cervical region: Secondary | ICD-10-CM | POA: Diagnosis not present

## 2021-02-27 DIAGNOSIS — Z8673 Personal history of transient ischemic attack (TIA), and cerebral infarction without residual deficits: Secondary | ICD-10-CM | POA: Diagnosis not present

## 2021-02-27 DIAGNOSIS — M5136 Other intervertebral disc degeneration, lumbar region: Secondary | ICD-10-CM | POA: Diagnosis not present

## 2021-02-27 DIAGNOSIS — R531 Weakness: Secondary | ICD-10-CM | POA: Diagnosis not present

## 2021-02-27 DIAGNOSIS — R2681 Unsteadiness on feet: Secondary | ICD-10-CM | POA: Diagnosis not present

## 2021-02-27 DIAGNOSIS — G8929 Other chronic pain: Secondary | ICD-10-CM | POA: Diagnosis not present

## 2021-02-27 DIAGNOSIS — M5030 Other cervical disc degeneration, unspecified cervical region: Secondary | ICD-10-CM | POA: Diagnosis not present

## 2021-02-28 ENCOUNTER — Ambulatory Visit (INDEPENDENT_AMBULATORY_CARE_PROVIDER_SITE_OTHER): Payer: Medicare Other | Admitting: Vascular Surgery

## 2021-02-28 ENCOUNTER — Other Ambulatory Visit: Payer: Self-pay

## 2021-02-28 ENCOUNTER — Ambulatory Visit (HOSPITAL_COMMUNITY)
Admission: RE | Admit: 2021-02-28 | Discharge: 2021-02-28 | Disposition: A | Discharge status: Home / Self Care | Payer: Medicare Other | Source: Ambulatory Visit | Attending: Vascular Surgery | Admitting: Vascular Surgery

## 2021-02-28 ENCOUNTER — Encounter: Payer: Self-pay | Admitting: Vascular Surgery

## 2021-02-28 VITALS — BP 143/84 | HR 72 | Temp 98.2°F | Resp 20 | Ht 64.0 in | Wt 174.0 lb

## 2021-02-28 DIAGNOSIS — M47816 Spondylosis without myelopathy or radiculopathy, lumbar region: Secondary | ICD-10-CM | POA: Diagnosis not present

## 2021-02-28 DIAGNOSIS — I6522 Occlusion and stenosis of left carotid artery: Secondary | ICD-10-CM

## 2021-02-28 DIAGNOSIS — N281 Cyst of kidney, acquired: Secondary | ICD-10-CM | POA: Diagnosis not present

## 2021-02-28 DIAGNOSIS — N135 Crossing vessel and stricture of ureter without hydronephrosis: Secondary | ICD-10-CM | POA: Diagnosis not present

## 2021-02-28 DIAGNOSIS — K5730 Diverticulosis of large intestine without perforation or abscess without bleeding: Secondary | ICD-10-CM | POA: Diagnosis not present

## 2021-02-28 DIAGNOSIS — N9489 Other specified conditions associated with female genital organs and menstrual cycle: Secondary | ICD-10-CM | POA: Diagnosis not present

## 2021-02-28 DIAGNOSIS — M48061 Spinal stenosis, lumbar region without neurogenic claudication: Secondary | ICD-10-CM | POA: Diagnosis not present

## 2021-02-28 DIAGNOSIS — I700 Atherosclerosis of aorta: Secondary | ICD-10-CM | POA: Diagnosis not present

## 2021-02-28 DIAGNOSIS — K828 Other specified diseases of gallbladder: Secondary | ICD-10-CM | POA: Diagnosis not present

## 2021-02-28 NOTE — Progress Notes (Signed)
REASON FOR VISIT:   Follow-up of left carotid stenosis.  MEDICAL ISSUES:   LEFT CAROTID STENOSIS: This patient's left carotid stenosis has improved.  She is now in the 40 to 59% category.  The stenosis is asymptomatic.  I explained that we would not consider left carotid endarterectomy unless she developed new left hemispheric symptoms or the stenosis progressed to greater than 80%.  I have ordered a follow-up carotid duplex scan in 1 year and she will be seen by the PAs at that time.  She is on aspirin and is on a statin.  She knows to call sooner if she has problems.  RIGHT LEG PAIN: She has been having some right leg and right hip pain.  I think this is related to her back.  She has palpable pedal pulses in both legs so I do not think her pain is related to arterial insufficiency.   HPI:   Karen Shannon is a pleasant 71 y.o. female who I saw in consultation on 08/29/2020 with a left carotid stenosis.  The patient has a history of a CVA.  She reportedly had a CT scan which showed a stroke in the right brain and had some left hand tingling related to the stroke.  When I saw her she had an asymptomatic 60 to 79% left carotid stenosis.  There was no significant stenosis on the right.  I explained that we would not consider left carotid endarterectomy unless the stenosis progressed to greater than 90% or she develop new left hemispheric symptoms.  We set up a follow-up duplex scan in 6 months.  Since I saw her last, she denies any history of stroke, TIAs, expressive or receptive aphasia, or amaurosis fugax.  She does have a history of degenerative disc disease of her back and has had been having pain in her right leg and right hip.  She is followed by an orthopedic surgeon who has been giving her injections and she is doing very well also with physical therapy.  I do not get any clear-cut history of claudication or rest pain.  Past Medical History:  Diagnosis Date   Acute deep vein  thrombosis (DVT) of left peroneal vein (HCC) 04/11/2019   Alzheimer's dementia ([facility])    Cervical spondylosis 03/18/2018   Chronic bilateral low back pain without sciatica 03/18/2018   Chronic foot pain, left 03/14/2019   Formatting of this note might be different from the original. Possible fracture base 2nd metatarsal   Chronic neck pain 03/18/2018   COPD (chronic obstructive pulmonary disease) ([place])    Dr. Delena Bali   DDD (degenerative disc disease), cervical 03/18/2018   Disp fx of fifth metatarsal bone, r foot, init for opn fx    DVT (deep venous thrombosis) ([place]) 03/2020   DVT femoral (deep venous thrombosis) with thrombophlebitis, left ([place]) 04/11/2019   Dyspnea on exertion 01/27/2018   Facet arthritis, degenerative, lumbar spine 03/18/2018   Family history of malignant neoplasm of breast    Generalized anxiety disorder 01/24/2019   Possible history of panic attacks   Greater trochanteric bursitis of both hips 04/10/2018   Heart murmur 01/27/2018   History of breast cancer 03/18/2018   S/p bilateral mastectomy  Formatting of this note might be different from the original. S/p bilateral mastectomy   History of deep vein thrombosis (DVT) of lower extremity 10/02/2019   Left leg swelling 02/23/2019   Lumbar spondylosis 03/18/2018   Malignant neoplasm of breast (female), unspecified site  Malignant neoplasm of central portion of left female breast ([place])    Mitral valve prolapse 01/27/2018   Obstructive sleep apnea    Variable CPAP use.   Other idiopathic scoliosis, lumbar region 03/18/2018   Pain of left calf 03/14/2019   Palpitations 01/25/2018   Pes planus of left foot 02/23/2019   Skin cancer    basal cell   Sprain of tarsometatarsal ligament of right foot 07/14/2019   Stasis dermatitis, acute 10/03/2019   Tenosynovitis of left ankle 04/11/2019   TIA (transient ischemic attack) 01/17/2019   Urge incontinence 07/28/2019   Varicose veins of left lower extremity     Ventricular tachycardia 02/28/2019    Family History  Problem Relation Age of Onset   Lymphoma Mother 48       deceased 28   Breast cancer Maternal Aunt        Dx 61s; Deceased 44s   Lung cancer Maternal Uncle    Stomach cancer Paternal Uncle        deceased 18   Liver cancer Maternal Uncle    Prostate cancer Other        3 mat cousins   Breast cancer Other 3       mother's mat half-sister   Emphysema Father     SOCIAL HISTORY: Social History   Tobacco Use   Smoking status: Never   Smokeless tobacco: Never  Substance Use Topics   Alcohol use: Not Currently    Allergies  Allergen Reactions   Clarithromycin Diarrhea and Other (See Comments)   Prednisone Other (See Comments)    Passed out   Sertraline Diarrhea   Sertraline Hcl Diarrhea    Current Outpatient Medications  Medication Sig Dispense Refill   ascorbic acid (VITAMIN C) 500 MG tablet Take 500 mg by mouth daily.     aspirin 81 MG chewable tablet Chew 81 mg by mouth daily.     atorvastatin (LIPITOR) 40 MG tablet Take 40 mg by mouth daily.      Cholecalciferol 25 MCG (1000 UT) capsule Take 1,000 Units by mouth daily.     dicyclomine (BENTYL) 10 MG capsule Take 10 mg by mouth as needed for spasms (IBS).     diphenoxylate-atropine (LOMOTIL) 2.5-0.025 MG tablet Take 1 tablet by mouth as needed for diarrhea or loose stools.     donepezil (ARICEPT) 10 MG tablet Take 10 mg by mouth at bedtime.     ergocalciferol (VITAMIN D2) 50000 units capsule Take 50,000 Units by mouth once a week.     furosemide (LASIX) 20 MG tablet Take 20 mg by mouth daily.     HYDROcodone-acetaminophen (NORCO) 7.5-325 MG tablet Take 1 tablet by mouth every 6 (six) hours as needed for moderate pain. 0.5 tablet as needed for bursitis and [place]     memantine (NAMENDA) 5 MG tablet Take 5 mg by mouth 2 (two) times daily.     metoprolol succinate (TOPROL-XL) 25 MG 24 hr tablet TAKE 1 TABLET BY MOUTH EVERY DAY (Patient taking differently: Take 25 mg by  mouth daily.) 90 tablet 2   montelukast (SINGULAIR) 10 MG tablet Take 10 mg by mouth at bedtime.     ondansetron (ZOFRAN) 4 MG tablet Take 4 mg by mouth every 8 (eight) hours as needed for nausea or vomiting.     tamsulosin (FLOMAX) 0.4 MG CAPS capsule      Tiotropium Bromide-Olodaterol 2.5-2.5 MCG/ACT AERS Inhale 2 puffs into the lungs 1 day or 1 dose.  vitamin B-12 (CYANOCOBALAMIN) 1000 MCG tablet Take 1,000 mcg by mouth daily.     No current facility-administered medications for this visit.    REVIEW OF SYSTEMS:  [X]  denotes positive finding, [ ]  denotes negative finding Cardiac  Comments:  Chest pain or chest pressure:    Shortness of breath upon exertion: x   Short of breath when lying flat:    Irregular heart rhythm: x       Vascular    Pain in calf, thigh, or hip brought on by ambulation: x   Pain in feet at night that wakes you up from your sleep:  x   Blood clot in your veins:    Leg swelling:         Pulmonary    Oxygen at home:    Productive cough:     Wheezing:         Neurologic    Sudden weakness in arms or legs:  x   Sudden numbness in arms or legs:     Sudden onset of difficulty speaking or slurred speech:    Temporary loss of vision in one eye:     Problems with dizziness:         Gastrointestinal    Blood in stool:     Vomited blood:         Genitourinary    Burning when urinating:  x   Blood in urine:        Psychiatric    Major depression:         Hematologic    Bleeding problems:    Problems with blood clotting too easily:        Skin    Rashes or ulcers:        Constitutional    Fever or chills:     PHYSICAL EXAM:   Vitals:   02/28/21 1541  BP: (!) 143/84  Pulse: 72  Resp: 20  Temp: 98.2 F (36.8 C)  SpO2: 97%  Weight: 174 lb (78.9 kg)  Height: 5\' 4"  (1.626 m)    GENERAL: The patient is a well-nourished female, in no acute distress. The vital signs are documented above. CARDIAC: There is a regular rate and rhythm.   VASCULAR: I do not detect carotid bruits. She has palpable femoral and dorsalis pedis pulses bilaterally. PULMONARY: There is good air exchange bilaterally without wheezing or rales. ABDOMEN: Soft and non-tender with normal pitched bowel sounds.  MUSCULOSKELETAL: There are no major deformities or cyanosis. NEUROLOGIC: No focal weakness or paresthesias are detected. SKIN: There are no ulcers or rashes noted. PSYCHIATRIC: The patient has a normal affect.  DATA:    CAROTID DUPLEX: I have independently interpreted her carotid duplex scan today.  On the right side there is no evidence of carotid stenosis.  The right vertebral artery is patent with antegrade flow.  On the left side there is evidence of a 40 to 59% carotid stenosis.  The velocities on the left have improved compared to her previous study.  The left vertebral artery is patent with antegrade flow.  There is no evidence of subclavian artery disease bilaterally.   Deitra Mayo [facility] [phone]

## 2021-03-01 DIAGNOSIS — Z8673 Personal history of transient ischemic attack (TIA), and cerebral infarction without residual deficits: Secondary | ICD-10-CM | POA: Diagnosis not present

## 2021-03-01 DIAGNOSIS — R2681 Unsteadiness on feet: Secondary | ICD-10-CM | POA: Diagnosis not present

## 2021-03-01 DIAGNOSIS — R531 Weakness: Secondary | ICD-10-CM | POA: Diagnosis not present

## 2021-03-01 DIAGNOSIS — G8929 Other chronic pain: Secondary | ICD-10-CM | POA: Diagnosis not present

## 2021-03-01 DIAGNOSIS — M5136 Other intervertebral disc degeneration, lumbar region: Secondary | ICD-10-CM | POA: Diagnosis not present

## 2021-03-01 DIAGNOSIS — M5030 Other cervical disc degeneration, unspecified cervical region: Secondary | ICD-10-CM | POA: Diagnosis not present

## 2021-03-12 DIAGNOSIS — N135 Crossing vessel and stricture of ureter without hydronephrosis: Secondary | ICD-10-CM | POA: Diagnosis not present

## 2021-03-12 DIAGNOSIS — R351 Nocturia: Secondary | ICD-10-CM | POA: Diagnosis not present

## 2021-03-12 DIAGNOSIS — N3281 Overactive bladder: Secondary | ICD-10-CM | POA: Diagnosis not present

## 2021-03-12 DIAGNOSIS — N390 Urinary tract infection, site not specified: Secondary | ICD-10-CM | POA: Diagnosis not present

## 2021-03-19 DIAGNOSIS — M47816 Spondylosis without myelopathy or radiculopathy, lumbar region: Secondary | ICD-10-CM | POA: Diagnosis not present

## 2021-03-19 DIAGNOSIS — M7062 Trochanteric bursitis, left hip: Secondary | ICD-10-CM | POA: Diagnosis not present

## 2021-03-19 DIAGNOSIS — M5416 Radiculopathy, lumbar region: Secondary | ICD-10-CM | POA: Insufficient documentation

## 2021-03-19 DIAGNOSIS — M7061 Trochanteric bursitis, right hip: Secondary | ICD-10-CM | POA: Diagnosis not present

## 2021-03-19 DIAGNOSIS — M5116 Intervertebral disc disorders with radiculopathy, lumbar region: Secondary | ICD-10-CM | POA: Diagnosis not present

## 2021-03-19 HISTORY — DX: Radiculopathy, lumbar region: M54.16

## 2021-03-21 NOTE — Telephone Encounter (Signed)
Opened in error

## 2021-03-24 DIAGNOSIS — M5127 Other intervertebral disc displacement, lumbosacral region: Secondary | ICD-10-CM | POA: Diagnosis not present

## 2021-03-24 DIAGNOSIS — M5125 Other intervertebral disc displacement, thoracolumbar region: Secondary | ICD-10-CM | POA: Diagnosis not present

## 2021-03-24 DIAGNOSIS — M5416 Radiculopathy, lumbar region: Secondary | ICD-10-CM | POA: Diagnosis not present

## 2021-03-24 DIAGNOSIS — M48061 Spinal stenosis, lumbar region without neurogenic claudication: Secondary | ICD-10-CM | POA: Diagnosis not present

## 2021-04-03 DIAGNOSIS — Q279 Congenital malformation of peripheral vascular system, unspecified: Secondary | ICD-10-CM | POA: Diagnosis not present

## 2021-04-03 DIAGNOSIS — M47812 Spondylosis without myelopathy or radiculopathy, cervical region: Secondary | ICD-10-CM | POA: Diagnosis not present

## 2021-04-03 DIAGNOSIS — M4312 Spondylolisthesis, cervical region: Secondary | ICD-10-CM | POA: Diagnosis not present

## 2021-04-03 DIAGNOSIS — I6529 Occlusion and stenosis of unspecified carotid artery: Secondary | ICD-10-CM | POA: Diagnosis not present

## 2021-04-07 IMAGING — MR MR HEAD WO/W CM
13 series · 48 of 48 positions shown · IV contrast (17ml Multihance)
Comparison: 10/28/2018

CLINICAL DATA: Unsteady gait.  Confusion.  Memory loss.

Creatinine was obtained on site at [HOSPITAL] at [HOSPITAL].
Results: Creatinine 0.9 mg/dL.
EXAM:
MRI HEAD WITHOUT CONTRAST
MRA HEAD WITHOUT CONTRAST
TECHNIQUE: Multiplanar, multiecho pulse sequences of the brain and surrounding
structures were obtained without intravenous contrast. Angiographic
images of the head were obtained using MRA technique without
contrast.

[Series 1: T1 · sagittal · 5.0mm · 0.47mm/px · 2 of 23 slices shown]
[im 1/23]
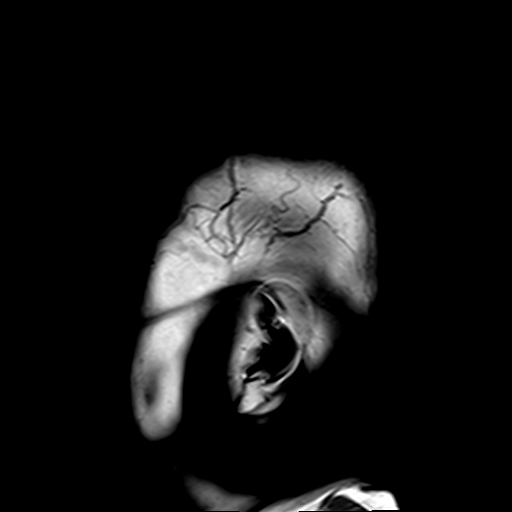
[im 23/23]
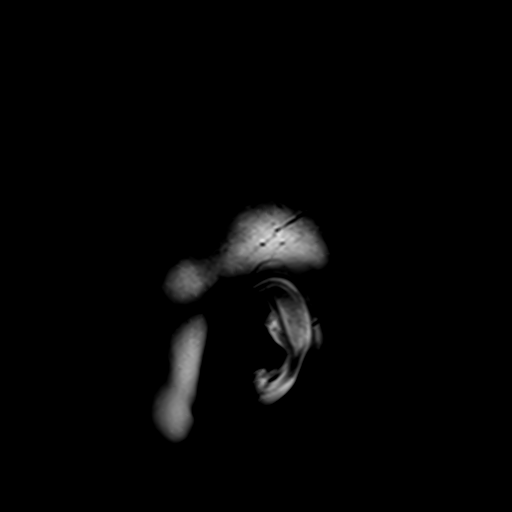

[Series 2: DWI · axial · 3.0mm · 1.88mm/px · z∈[-59,+96]mm · 6 of 106 slices shown (1 of 4)]
[im 1/106]
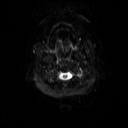
[im 22/106]
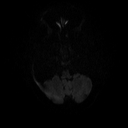
[im 43/106]
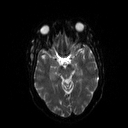
[im 64/106]
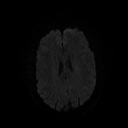
[im 85/106]
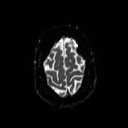
[im 106/106]
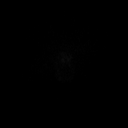

[Series 3: DWI · axial · 3.0mm · 1.88mm/px · z∈[-59,+96]mm · 3 of 52 slices shown (2 of 4)]
[im 1/52]
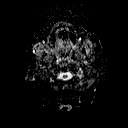
[im 26/52]
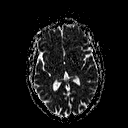
[im 52/52]
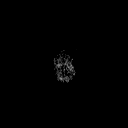

[Series 4: FLAIR · axial · 3.0mm · 0.47mm/px · z∈[-57,+95]mm · 2 of 34 slices shown]
[im 1/34]
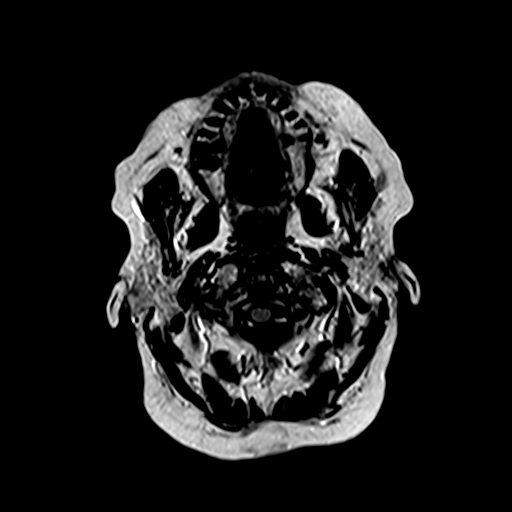
[im 34/34]
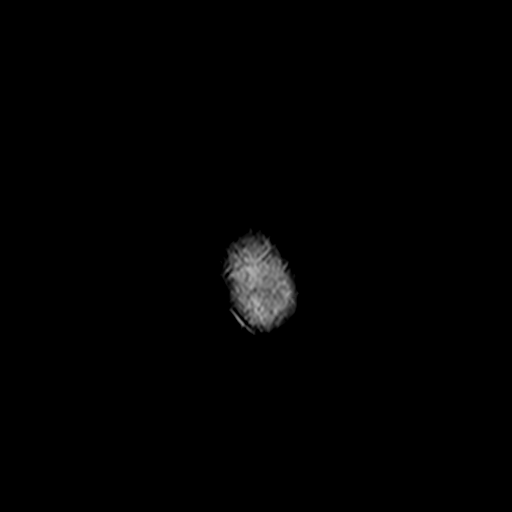

[Series 5: T2 · axial · 5.0mm · 0.62mm/px · 1 of 23 slices shown (1 of 2)]
[im 1/23]
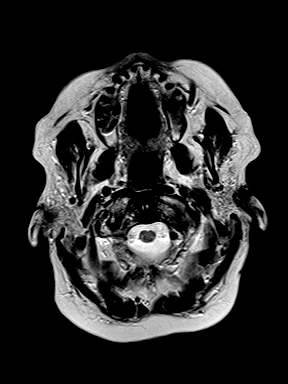

[Series 7: swi_images · axial · 5.0mm · 0.94mm/px · z∈[-57,+97]mm · 2 of 32 slices shown]
[im 1/32]
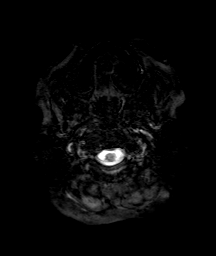
[im 32/32]
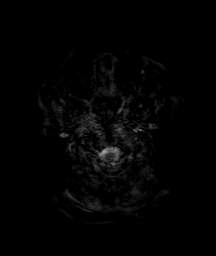

[Series 8: t1_mpr_tra · axial · 1.0mm · 0.75mm/px · z∈[-58,+99]mm · 10 of 160 slices shown (1 of 2)]
[im 1/160]
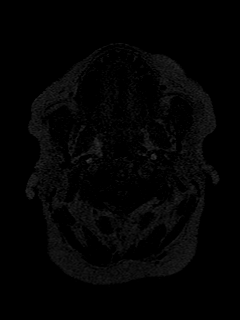
[im 18/160]
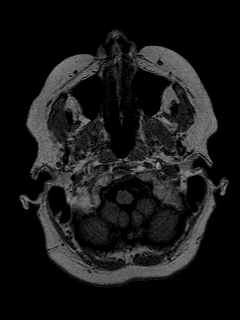
[im 36/160]
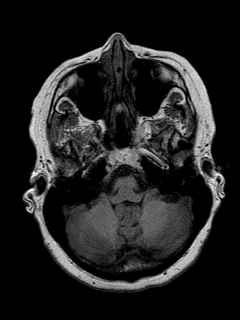
[im 54/160]
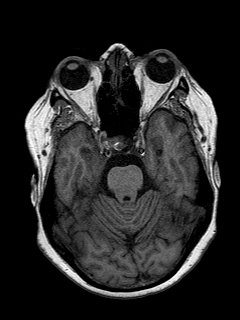
[im 71/160]
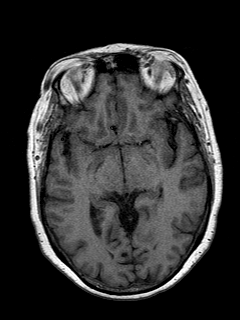
[im 89/160]
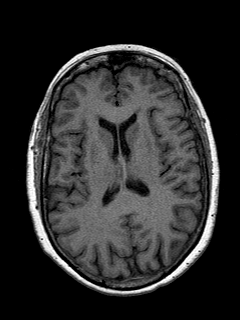
[im 107/160]
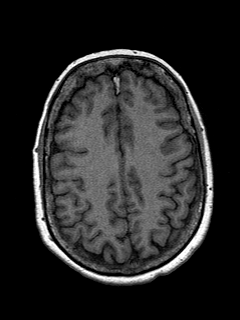
[im 124/160]
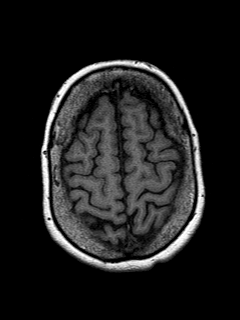
[im 142/160]
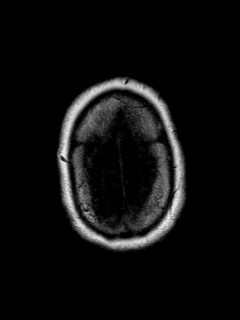
[im 160/160]
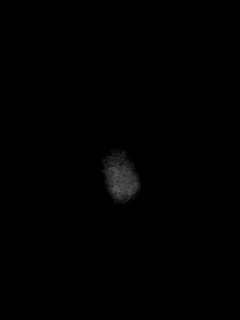

[Series 9: DWI · coronal · 5.0mm · 1.80mm/px · 5 of 74 slices shown (3 of 4)]
[im 1/74]
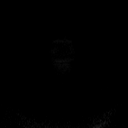
[im 19/74]
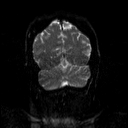
[im 37/74]
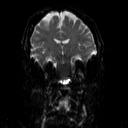
[im 55/74]
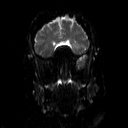
[im 74/74]
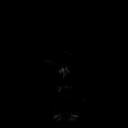

[Series 10: DWI · coronal · 5.0mm · 1.80mm/px · 2 of 38 slices shown (4 of 4)]
[im 1/38]
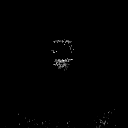
[im 38/38]
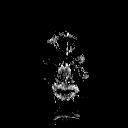

[Series 11: T2 · coronal · 5.0mm · 0.45mm/px · 2 of 29 slices shown (2 of 2)]
[im 1/29]
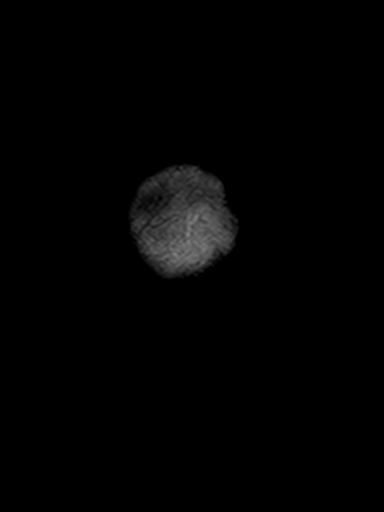
[im 29/29]
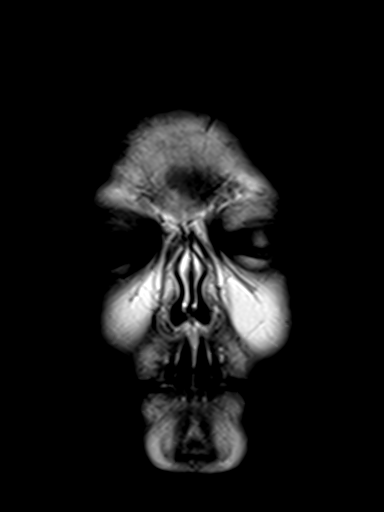

[Series 12: t1_mpr_tra · axial · 1.0mm · 0.75mm/px · z∈[-58,+99]mm · 10 of 160 slices shown (2 of 2)]
[im 1/160]
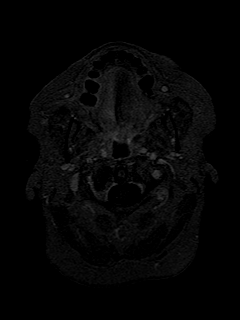
[im 18/160]
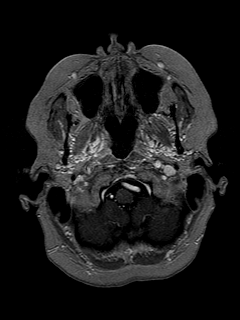
[im 36/160]
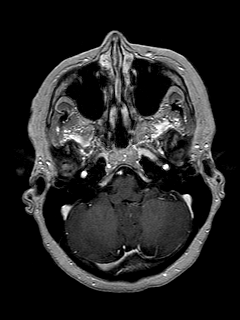
[im 54/160]
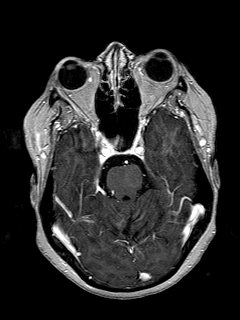
[im 71/160]
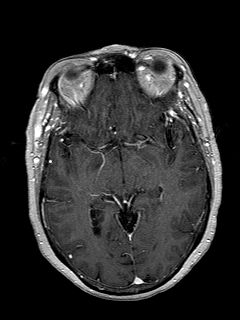
[im 89/160]
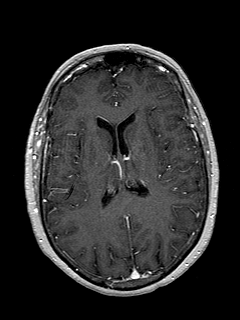
[im 107/160]
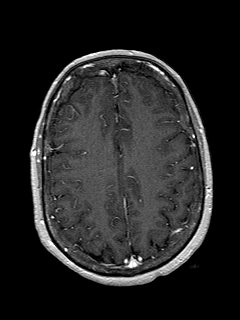
[im 124/160]
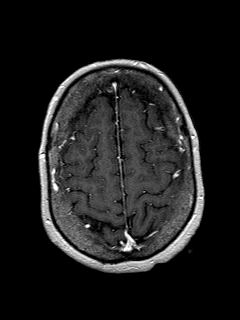
[im 142/160]
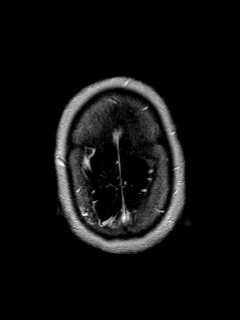
[im 160/160]
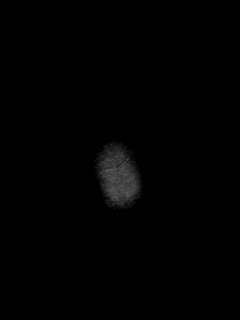

[Series 13: post cor · coronal · 5.0mm · 0.45mm/px · 2 of 29 slices shown]
[im 1/29]
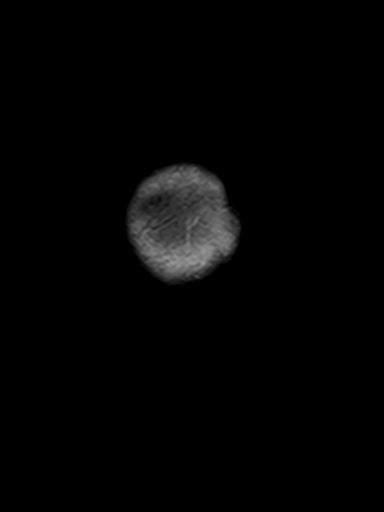
[im 29/29]
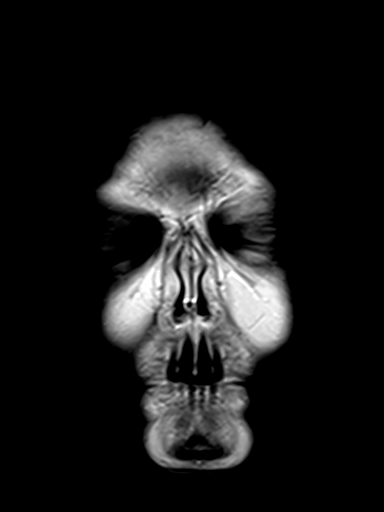

[Series 14: post sag · sagittal · 5.0mm · 0.47mm/px · 1 of 23 slices shown]
[im 1/23]
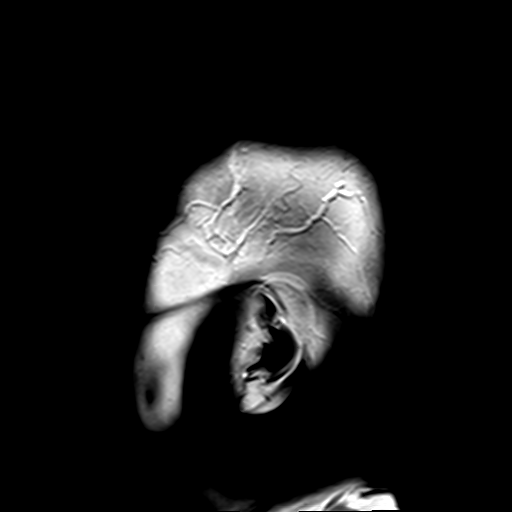

[48 of 48 positions shown; findings below may reference images not displayed]

FINDINGS: MRI HEAD FINDINGS

Brain: The brain has a normal appearance without evidence of
malformation, atrophy, old or acute small or large vessel
infarction, mass lesion, hemorrhage, hydrocephalus or extra-axial
collection.

Vascular: Major vessels at the base of the brain show flow. Venous
sinuses appear patent.

Skull and upper cervical spine: Normal.

Sinuses/Orbits: Clear/normal.

Other: None significant.

MRA HEAD FINDINGS

Both internal carotid arteries are widely patent into the brain. No
siphon stenosis. The anterior and middle cerebral vessels are patent
without proximal stenosis, aneurysm or vascular malformation.

Both vertebral arteries are widely patent to the basilar. No basilar
stenosis. Posterior circulation branch vessels appear normal.
IMPRESSION: Normal examinations. No abnormality seen to explain the presenting
symptoms. No change since 10/28/2018

## 2021-04-11 DIAGNOSIS — M7062 Trochanteric bursitis, left hip: Secondary | ICD-10-CM | POA: Diagnosis not present

## 2021-04-11 DIAGNOSIS — M7061 Trochanteric bursitis, right hip: Secondary | ICD-10-CM | POA: Diagnosis not present

## 2021-04-12 ENCOUNTER — Other Ambulatory Visit: Payer: Self-pay

## 2021-04-12 ENCOUNTER — Encounter: Payer: Self-pay | Admitting: Cardiology

## 2021-04-12 ENCOUNTER — Ambulatory Visit (INDEPENDENT_AMBULATORY_CARE_PROVIDER_SITE_OTHER): Payer: Medicare Other | Admitting: Cardiology

## 2021-04-12 VITALS — BP 120/70 | HR 76 | Ht 64.0 in | Wt 176.0 lb

## 2021-04-12 DIAGNOSIS — I341 Nonrheumatic mitral (valve) prolapse: Secondary | ICD-10-CM | POA: Diagnosis not present

## 2021-04-12 DIAGNOSIS — I472 Ventricular tachycardia, unspecified: Secondary | ICD-10-CM | POA: Diagnosis not present

## 2021-04-12 DIAGNOSIS — R002 Palpitations: Secondary | ICD-10-CM | POA: Diagnosis not present

## 2021-04-12 DIAGNOSIS — I350 Nonrheumatic aortic (valve) stenosis: Secondary | ICD-10-CM | POA: Diagnosis not present

## 2021-04-12 DIAGNOSIS — G459 Transient cerebral ischemic attack, unspecified: Secondary | ICD-10-CM | POA: Diagnosis not present

## 2021-04-12 HISTORY — DX: Nonrheumatic aortic (valve) stenosis: I35.0

## 2021-04-12 MED ORDER — ATORVASTATIN CALCIUM 80 MG PO TABS: 80.0000 mg | ORAL_TABLET | Freq: Every day | ORAL | 3 refills | Status: DC

## 2021-04-12 NOTE — Addendum Note (Signed)
Addended by: Edwyna Shell I on: 04/12/2021 11:04 AM ? ? Modules accepted: Orders ? ?

## 2021-04-12 NOTE — Patient Instructions (Signed)
Medication Instructions:  ?Your physician has recommended you make the following change in your medication:  ? ?START: Lipitor 80 mg daily ? ?*If you need a refill on your cardiac medications before your next appointment, please call your pharmacy* ? ? ?Lab Work: ?Your physician recommends that you return for lab work in:  ? ?Labs in 6 weeks: Lipids, LFT ? ?If you have labs (blood work) drawn today and your tests are completely normal, you will receive your results only by: ?MyChart Message (if you have MyChart) OR ?A paper copy in the mail ?If you have any lab test that is abnormal or we need to change your treatment, we will call you to review the results. ? ? ?Testing/Procedures: ?None ? ? ?Follow-Up: ?At [facility], you and your health needs are our priority.  As part of our continuing mission to provide you with exceptional heart care, we have created designated Provider Care Teams.  These Care Teams include your primary Cardiologist (physician) and Advanced Practice Providers (APPs -  Physician Assistants and Nurse Practitioners) who all work together to provide you with the care you need, when you need it. ? ?We recommend signing up for the patient portal called "MyChart".  Sign up information is provided on this After Visit Summary.  MyChart is used to connect with patients for Virtual Visits (Telemedicine).  Patients are able to view lab/test results, encounter notes, upcoming appointments, etc.  Non-urgent messages can be sent to your provider as well.   ?To learn more about what you can do with MyChart, go to [email or web address].   ? ?Your next appointment:   ?6 month(s) ? ?The format for your next appointment:   ?In Person ? ?Provider:   ?Jenne Campus, MD  ? ? ?Other Instructions ?None  ?

## 2021-04-12 NOTE — Progress Notes (Signed)
Cardiology Office Note:    Date:  04/12/2021   ID:  [name], DOB 1951-01-05, MRN [id number]  PCP:  Nicoletta Dress, MD  Cardiologist:  Jenne Campus, MD    Referring MD: Nicoletta Dress, MD   Chief Complaint  Patient presents with   Follow-up  I am doing fine  History of Present Illness:    Cintia Gleed is a 71 y.o. female past medical history significant for DVT in the left lower extremity which was provoked, essential hypertension, nonsustained ventricular tachycardia, so far echocardiogram showed no evidence of cardiomyopathy stress test showed no evidence of ischemia.  Also recently in June she ended going to the hospital where she was find to have lacunar stroke on the right side of her brain she also got carotic ultrasounds which showed up to 69% stenosis on the left side.  She did visit with vascular surgeon who very appropriately told her there is no indication for surgery.  She is being treated conservatively.  She comes today to my office for follow-up.  Overall she seems to be doing well.  She does have a problem in the leg and some nerve injury to some surgery but being contemplated she is getting ready to see surgeon.  Cardiac wise doing well denies have any chest pain tightness squeezing pressure mid chest no palpitations dizziness swelling of lower extremities.  Past Medical History:  Diagnosis Date   Acute deep vein thrombosis (DVT) of left peroneal vein (HCC) 04/11/2019   Alzheimer's dementia ([place])    Cervical spondylosis 03/18/2018   Chronic bilateral low back pain without sciatica 03/18/2018   Chronic foot pain, left 03/14/2019   Formatting of this note might be different from the original. Possible fracture base 2nd metatarsal   Chronic neck pain 03/18/2018   COPD (chronic obstructive pulmonary disease) ([employer])    Dr. Delena Bali   DDD (degenerative disc disease), cervical 03/18/2018   Disp fx of fifth metatarsal bone, r foot, init for opn fx     DVT (deep venous thrombosis) ([place]) 03/2020   DVT femoral (deep venous thrombosis) with thrombophlebitis, left ([place]) 04/11/2019   Dyspnea on exertion 01/27/2018   Facet arthritis, degenerative, lumbar spine 03/18/2018   Family history of malignant neoplasm of breast    Generalized anxiety disorder 01/24/2019   Possible history of panic attacks   Greater trochanteric bursitis of both hips 04/10/2018   Heart murmur 01/27/2018   History of breast cancer 03/18/2018   S/p bilateral mastectomy  Formatting of this note might be different from the original. S/p bilateral mastectomy   History of deep vein thrombosis (DVT) of lower extremity 10/02/2019   Left leg swelling 02/23/2019   Lumbar spondylosis 03/18/2018   Malignant neoplasm of breast (female), unspecified site    Malignant neoplasm of central portion of left female breast ([place])    Mitral valve prolapse 01/27/2018   Obstructive sleep apnea    Variable CPAP use.   Other idiopathic scoliosis, lumbar region 03/18/2018   Pain of left calf 03/14/2019   Palpitations 01/25/2018   Pes planus of left foot 02/23/2019   Skin cancer    basal cell   Sprain of tarsometatarsal ligament of right foot 07/14/2019   Stasis dermatitis, acute 10/03/2019   Tenosynovitis of left ankle 04/11/2019   TIA (transient ischemic attack) 01/17/2019   Urge incontinence 07/28/2019   Varicose veins of left lower extremity    Ventricular tachycardia 02/28/2019    Past Surgical History:  Procedure Laterality  Date   BUNIONECTOMY Right 1999   MASTECTOMY Bilateral 2012   NASAL SEPTUM SURGERY  1996   SKIN CANCER EXCISION  2018   Top of head    SKIN CANCER EXCISION  07/2019   Nose   TENDON TRANSFER Right 1999   foot    Current Medications: Current Meds  Medication Sig   ascorbic acid (VITAMIN C) 500 MG tablet Take 500 mg by mouth daily.   aspirin 81 MG chewable tablet Chew 81 mg by mouth daily.   atorvastatin (LIPITOR) 40 MG tablet Take 40 mg by mouth  daily.    Cholecalciferol 25 MCG (1000 UT) capsule Take 1,000 Units by mouth daily.   dicyclomine (BENTYL) 10 MG capsule Take 10 mg by mouth as needed for spasms (IBS).   diphenoxylate-atropine (LOMOTIL) 2.5-0.025 MG tablet Take 1 tablet by mouth as needed for diarrhea or loose stools.   donepezil (ARICEPT) 10 MG tablet Take 10 mg by mouth at bedtime.   ergocalciferol (VITAMIN D2) 50000 units capsule Take 50,000 Units by mouth once a week.   furosemide (LASIX) 20 MG tablet Take 20 mg by mouth daily.   HYDROcodone-acetaminophen (NORCO) 7.5-325 MG tablet Take 1 tablet by mouth every 6 (six) hours as needed for moderate pain. 0.5 tablet as needed for bursitis and [place]   memantine (NAMENDA) 5 MG tablet Take 5 mg by mouth 2 (two) times daily.   metoprolol succinate (TOPROL-XL) 25 MG 24 hr tablet TAKE 1 TABLET BY MOUTH EVERY DAY   montelukast (SINGULAIR) 10 MG tablet Take 10 mg by mouth at bedtime.   ondansetron (ZOFRAN) 4 MG tablet Take 4 mg by mouth every 8 (eight) hours as needed for nausea or vomiting.   solifenacin (VESICARE) 10 MG tablet Take by mouth daily.   tamsulosin (FLOMAX) 0.4 MG CAPS capsule    Tiotropium Bromide-Olodaterol 2.5-2.5 MCG/ACT AERS Inhale 2 puffs into the lungs 1 day or 1 dose.   vitamin B-12 (CYANOCOBALAMIN) 1000 MCG tablet Take 1,000 mcg by mouth daily.     Allergies:   Clarithromycin, Prednisone, Sertraline, and Sertraline hcl   Social History   Socioeconomic History   Marital status: Single    Spouse name: Not on file   Number of children: 0   Years of education: 16   Highest education level: Bachelor's degree (e.g., BA, AB, BS)  Occupational History   Not on file  Tobacco Use   Smoking status: Never   Smokeless tobacco: Never  Vaping Use   Vaping Use: Never used  Substance and Sexual Activity   Alcohol use: Not Currently   Drug use: Never   Sexual activity: Not on file  Other Topics Concern   Not on file  Social History Narrative   Pt is single she  lives alone has no children   Right handed   Social Determinants of Health   Financial Resource Strain: Not on file  Food Insecurity: Not on file  Transportation Needs: Not on file  Physical Activity: Not on file  Stress: Not on file  Social Connections: Not on file     Family History: [name] history includes Breast cancer in her maternal aunt; Breast cancer (age of onset: 36) in an other family member; Emphysema in her father; Liver cancer in her maternal uncle; Lung cancer in her maternal uncle; Lymphoma (age of onset: 37) in her mother; Prostate cancer in an other family member; Stomach cancer in her paternal uncle. ROS:   Please see the history  of present illness.    All 14 point review of systems negative except as described per history of present illness  EKGs/Labs/Other Studies Reviewed:      Recent Labs: No results found for requested labs within last 8760 hours.  Recent Lipid Panel    Component Value Date/Time   CHOL 174 02/28/2019 1447   TRIG 83 02/28/2019 1447   HDL 57 02/28/2019 1447   CHOLHDL 3.1 02/28/2019 1447   LDLCALC 102 (H) 02/28/2019 1447   LDLDIRECT 89 10/29/2020 1038    Physical Exam:    VS:  BP 120/70 (BP Location: Right Arm, Patient Position: Sitting, Cuff Size: Normal)    Pulse 76    Ht 5\' 4"  (1.626 m)    Wt 176 lb (79.8 kg)    SpO2 96%    BMI 30.21 kg/m     Wt Readings from Last 3 Encounters:  04/12/21 176 lb (79.8 kg)  02/28/21 174 lb (78.9 kg)  12/21/20 174 lb 12.8 oz (79.3 kg)     GEN:  Well nourished, well developed in no acute distress HEENT: Normal NECK: No JVD; No carotid bruits LYMPHATICS: No lymphadenopathy CARDIAC: RRR, systolic ejection murmur grade 1/6 to 2/6 best heard right upper portion of the sternum, S2 is still present no rubs, no gallops RESPIRATORY:  Clear to auscultation without rales, wheezing or rhonchi  ABDOMEN: Soft, non-tender, non-distended MUSCULOSKELETAL:  No edema; No deformity  SKIN: Warm and  dry LOWER EXTREMITIES: no swelling NEUROLOGIC:  Alert and oriented x 3 PSYCHIATRIC:  Normal affect   ASSESSMENT:    1. Ventricular tachycardia   2. Mitral valve prolapse   3. Nonrheumatic aortic valve stenosis   4. TIA (transient ischemic attack)   5. Palpitations    PLAN:    In order of problems listed above:  Ventricular tachycardia: Denies having any dizziness palpitations.  Work-up so far negative which included echocardiogram which showed preserved ejection fraction stress test being negative History of mitral valve prolapse not significant. Aortic stenosis only mild.  Continue monitoring. History of TIA.  Felt to be phenomenon in situ in her brain.  The key is to reduce her risk factors as best possible.  I did review her cholesterol today her LDL was 94 HDL 71.  She is on 40 mg of Lipitor I advised to increase Lipitor to 80 mg daily, fasting lipid profile, AST LT will be done within the next 6 weeks. Palpitations: She denies having any Cardiac arterial disease only up to 59%.  Stable.  Antiplatelet therapy and statin   Medication Adjustments/Labs and Tests Ordered: Current medicines are reviewed at length with the patient today.  Concerns regarding medicines are outlined above.  No orders of the defined types were placed in this encounter.  Medication changes: No orders of the defined types were placed in this encounter.   Signed, Park Liter, MD, [facility] 04/12/2021 10:43 AM    [place]

## 2021-04-16 DIAGNOSIS — J3489 Other specified disorders of nose and nasal sinuses: Secondary | ICD-10-CM | POA: Diagnosis not present

## 2021-04-16 DIAGNOSIS — J358 Other chronic diseases of tonsils and adenoids: Secondary | ICD-10-CM | POA: Diagnosis not present

## 2021-04-16 DIAGNOSIS — Q279 Congenital malformation of peripheral vascular system, unspecified: Secondary | ICD-10-CM | POA: Diagnosis not present

## 2021-04-16 DIAGNOSIS — E041 Nontoxic single thyroid nodule: Secondary | ICD-10-CM | POA: Diagnosis not present

## 2021-04-16 DIAGNOSIS — J342 Deviated nasal septum: Secondary | ICD-10-CM | POA: Diagnosis not present

## 2021-05-08 DIAGNOSIS — M4125 Other idiopathic scoliosis, thoracolumbar region: Secondary | ICD-10-CM | POA: Diagnosis not present

## 2021-05-08 DIAGNOSIS — M545 Low back pain, unspecified: Secondary | ICD-10-CM | POA: Diagnosis not present

## 2021-05-08 DIAGNOSIS — M4316 Spondylolisthesis, lumbar region: Secondary | ICD-10-CM | POA: Diagnosis not present

## 2021-05-11 DIAGNOSIS — M19072 Primary osteoarthritis, left ankle and foot: Secondary | ICD-10-CM | POA: Diagnosis not present

## 2021-05-11 DIAGNOSIS — M19071 Primary osteoarthritis, right ankle and foot: Secondary | ICD-10-CM | POA: Diagnosis not present

## 2021-05-11 DIAGNOSIS — M160 Bilateral primary osteoarthritis of hip: Secondary | ICD-10-CM | POA: Diagnosis not present

## 2021-05-11 DIAGNOSIS — R413 Other amnesia: Secondary | ICD-10-CM | POA: Diagnosis not present

## 2021-05-11 DIAGNOSIS — Z86718 Personal history of other venous thrombosis and embolism: Secondary | ICD-10-CM | POA: Diagnosis not present

## 2021-05-11 DIAGNOSIS — E0590 Thyrotoxicosis, unspecified without thyrotoxic crisis or storm: Secondary | ICD-10-CM | POA: Diagnosis not present

## 2021-05-11 DIAGNOSIS — E559 Vitamin D deficiency, unspecified: Secondary | ICD-10-CM | POA: Diagnosis not present

## 2021-05-11 DIAGNOSIS — E785 Hyperlipidemia, unspecified: Secondary | ICD-10-CM | POA: Diagnosis not present

## 2021-05-11 DIAGNOSIS — J4540 Moderate persistent asthma, uncomplicated: Secondary | ICD-10-CM | POA: Diagnosis not present

## 2021-05-11 DIAGNOSIS — R002 Palpitations: Secondary | ICD-10-CM | POA: Diagnosis not present

## 2021-05-13 DIAGNOSIS — R102 Pelvic and perineal pain: Secondary | ICD-10-CM | POA: Diagnosis not present

## 2021-05-13 DIAGNOSIS — S0003XA Contusion of scalp, initial encounter: Secondary | ICD-10-CM | POA: Diagnosis not present

## 2021-05-13 DIAGNOSIS — Q283 Other malformations of cerebral vessels: Secondary | ICD-10-CM | POA: Diagnosis not present

## 2021-05-13 DIAGNOSIS — M25532 Pain in left wrist: Secondary | ICD-10-CM | POA: Diagnosis not present

## 2021-05-13 DIAGNOSIS — M25512 Pain in left shoulder: Secondary | ICD-10-CM | POA: Diagnosis not present

## 2021-05-13 DIAGNOSIS — S199XXA Unspecified injury of neck, initial encounter: Secondary | ICD-10-CM | POA: Diagnosis not present

## 2021-05-13 DIAGNOSIS — S0990XA Unspecified injury of head, initial encounter: Secondary | ICD-10-CM | POA: Diagnosis not present

## 2021-05-13 DIAGNOSIS — M852 Hyperostosis of skull: Secondary | ICD-10-CM | POA: Diagnosis not present

## 2021-05-13 DIAGNOSIS — G939 Disorder of brain, unspecified: Secondary | ICD-10-CM | POA: Diagnosis not present

## 2021-05-13 DIAGNOSIS — M4312 Spondylolisthesis, cervical region: Secondary | ICD-10-CM | POA: Diagnosis not present

## 2021-05-13 DIAGNOSIS — M25552 Pain in left hip: Secondary | ICD-10-CM | POA: Diagnosis not present

## 2021-05-13 DIAGNOSIS — M47812 Spondylosis without myelopathy or radiculopathy, cervical region: Secondary | ICD-10-CM | POA: Diagnosis not present

## 2021-05-13 DIAGNOSIS — M19032 Primary osteoarthritis, left wrist: Secondary | ICD-10-CM | POA: Diagnosis not present

## 2021-05-14 DIAGNOSIS — M79662 Pain in left lower leg: Secondary | ICD-10-CM | POA: Diagnosis not present

## 2021-05-14 DIAGNOSIS — M79641 Pain in right hand: Secondary | ICD-10-CM | POA: Diagnosis not present

## 2021-05-14 DIAGNOSIS — S0003XA Contusion of scalp, initial encounter: Secondary | ICD-10-CM | POA: Diagnosis not present

## 2021-05-14 DIAGNOSIS — S060X0A Concussion without loss of consciousness, initial encounter: Secondary | ICD-10-CM | POA: Diagnosis not present

## 2021-05-14 DIAGNOSIS — S8012XA Contusion of left lower leg, initial encounter: Secondary | ICD-10-CM | POA: Diagnosis not present

## 2021-05-14 DIAGNOSIS — M25532 Pain in left wrist: Secondary | ICD-10-CM | POA: Diagnosis not present

## 2021-05-14 DIAGNOSIS — Z043 Encounter for examination and observation following other accident: Secondary | ICD-10-CM | POA: Diagnosis not present

## 2021-05-20 DIAGNOSIS — S0093XA Contusion of unspecified part of head, initial encounter: Secondary | ICD-10-CM | POA: Diagnosis not present

## 2021-05-20 DIAGNOSIS — M62838 Other muscle spasm: Secondary | ICD-10-CM | POA: Diagnosis not present

## 2021-05-20 DIAGNOSIS — S1093XA Contusion of unspecified part of neck, initial encounter: Secondary | ICD-10-CM | POA: Diagnosis not present

## 2021-06-10 DIAGNOSIS — G4733 Obstructive sleep apnea (adult) (pediatric): Secondary | ICD-10-CM | POA: Diagnosis not present

## 2021-06-12 DIAGNOSIS — J4540 Moderate persistent asthma, uncomplicated: Secondary | ICD-10-CM | POA: Diagnosis not present

## 2021-06-12 DIAGNOSIS — R5383 Other fatigue: Secondary | ICD-10-CM | POA: Diagnosis not present

## 2021-06-12 DIAGNOSIS — G4733 Obstructive sleep apnea (adult) (pediatric): Secondary | ICD-10-CM | POA: Diagnosis not present

## 2021-06-14 DIAGNOSIS — M5416 Radiculopathy, lumbar region: Secondary | ICD-10-CM | POA: Diagnosis not present

## 2021-06-14 DIAGNOSIS — M4125 Other idiopathic scoliosis, thoracolumbar region: Secondary | ICD-10-CM | POA: Diagnosis not present

## 2021-06-14 DIAGNOSIS — M4316 Spondylolisthesis, lumbar region: Secondary | ICD-10-CM | POA: Diagnosis not present

## 2021-06-14 NOTE — Progress Notes (Signed)
? ?NEUROLOGY FOLLOW UP OFFICE NOTE ? ?Karen Shannon ?[id number] ? ?Assessment/Plan:  ? ?Memory deficits.  Worse since last visit. ?Cerebrovascular disease.  Chronic basal ganglia infarct is incidental finding ? ?1  Repeat neuropsychological evaluation to evaluate for any progression or changes. ?2  Follow up afterwards. ? ?Subjective:  ?Karen Shannon is a 71 year old female who follows up for migraines. ?  ?UPDATE: ?Current medications:  ASA '325mg'$  daily, atorvastatin '40mg'$ , Lasix ? ?Following last visit, around May 2020, she fell and hit her head.  She complained of worsening memory problems.  She started having memory problems again.  One time, she was having trouble driving to a doctor's appointment.  She couldn't remember the exit.  She usually uses a GPS but didn't that day because she goes to that office frequently.  MRI of brain on 07/19/2020 showed incidental chronic lacunar infarct within the right basal ganglia new since prior MRI from 10/24/2018 but no acute or subacute findings.  Carotid ultrasound on 08/08/2020 showed bilateral heterogenous and calcified plaque within the ICAs, no hemodynamically significant stenosis on the right but 50-69% on the left.  She was subsequently diagnosed with Alzheimer's disease by her PCP and started on donepezil and memantine.  She thinks it has helped.  Has OSA and recently got a new CPAP ? ?She is followed by cardiology for ventricular tachycardia.  Workup including TTE and stress test were negative.   ?  ?She has balance problems related to scoliosis and degenerative spine disease with sciatica.  She is receiving injections.  On 05/13/2021, she tripped and fell striking the left side of her head, arm and hip on the concrete pavement.  No LOC.  She was unable to get up due to chronic back pain.  Neighbor came and brought her to the ED at [facility].  CT head was unremarkable.  CT cervical spine showed multilevel degenerative disc disease and facet arthropathy.    ? ?Labs from April show normal thyroid panel, LDL 76 ?  ?HISTORY: ?She was diagnosed with "confusional migraines" in the 1970s, where she has severe migraine headache associated with brief confusion lasting 30 minutes.  She typically has a couple a year.  Initially, she was on nortriptyline for a year but stopped due to side effects. ?  ?On the night of 10/19/2018, she woke up with stinging paresthesias involving the left side of her face from forehead, over her left eye and down to the left side of her chin.  She also noted moderate left frontotemporal throbbing pain as well as left otalgia, decreased hearing in left ear, blurred vision in left eye, nausea and noted that she was drooling from the corner of both sides of her mouth.  She got up and felt dizzy and unsteady on her feet.  No slurred speech, speech disturbance, or new unilateral facial droop or extremity weakness.  No fevers.  She was tested for COVID, which was negative.  No specific triggers.  She had an MRI of the brain without contrast on 10/29/2018 which was unremarkable.  Her PCP thought she may have had a migraine, so she was prescribed topiramate.  She stopped after one dose because it didn't make her feel well.  Due to the left otalgia and trouble hearing, she saw ENT.  Exam was negative.  She had mild residual symptoms everyday up until for the next 2 weeks.  During that time, she did have one of her typical migraines.  She noted confusion off and  on over those 3 weeks as well.  Typical migraines not associated with left facial paresthesias, drooling and dizziness. Carotid doppler from 11/24/2018 showed only minimal heterogenous plaque with no hemodynamically significant stenosis. In November 2020, she was driving to the grocery store and then she started having a "fuzzy feeling" on her forehead and she suddenly became confused and was disoriented.  She didn't know where she was.  She had to ask her passenger where to go.  It lasted about 5  minutes.  Since then, she has had residual symptoms.  She reported intermittent slurred speech.  She has short term memory problems.  She states this is different than her prior migraines with confusion.  She has left sided neck pain and ear and her orthopedist started her on Mobic which was ineffective.  Left eye is still blurred.  Sed rate was 24.  MRI of brain w wo and MRA of head were normal.  Echocardiogram from 01/21/2019 showed EF 55-60% with no cardiac source of embolus.  Two week holter monitor from 12/12 to 02/05/2019 showed no a fib.  She was subsequently found to have a DVT so ASA was switched to Eliquis in the meantime.  She saw Dr. Marshall Cork at [facility] on 01/10/2019 was unremarkable.  Bilateral ptosis was noted.  She was referred for surgery but is on hold while on anticoagulation.  Neuropsychological testing performed by Dr. Hazle Coca on 01/24/2019 was normal and her subjective memory complaints was felt to be secondary to depression and anxiety. ? ?  ?She does have chronic neck pain with cervical spondylosis, which is treated by orthopedics.  MRI of cervical spine from 03/27/2018 showed degenerative cervical spondylosis with edematous arthritis at C1-2 on right and C3-4 on the left but no significant foraminal or spinal stenosis.  She says it has gradually gotten worse over the past few months.  MRI of lumbar spine from same day showed scoliosis as well as multilevel degenerative changes including foraminal narrowing on left that may be affecting left L3 nerve root and right foraminal narrowing at L4-5 and L5-S1, possibly affecting right L5 nerve. ?  ?She has history of mitral valve prolapse with slight murmur and palpitations which is monitored by cardiology.  Echocardiogram from 02/05/2018 was unremarkable with LV EF of 55-60% and grade 1 diastolic dysfunction.  48 hour Holter monitor in January 2020 reportedly revealed no significant arrhythmias.  She was started on  metoprolol around that time.   ?  ?Past migraine preventatives:  Nortriptyline, topiramate ? ?PAST MEDICAL HISTORY: ?Past Medical History:  ?Diagnosis Date  ? Acute deep vein thrombosis (DVT) of left peroneal vein ([place]) 04/11/2019  ? Alzheimer's dementia ([place])   ? Cervical spondylosis 03/18/2018  ? Chronic bilateral low back pain without sciatica 03/18/2018  ? Chronic foot pain, left 03/14/2019  ? Formatting of this note might be different from the original. Possible fracture base 2nd metatarsal  ? Chronic neck pain 03/18/2018  ? COPD (chronic obstructive pulmonary disease) ([place])   ? Dr. Delena Bali  ? DDD (degenerative disc disease), cervical 03/18/2018  ? Disp fx of fifth metatarsal bone, r foot, init for opn fx   ? DVT (deep venous thrombosis) ([place]) 03/2020  ? DVT femoral (deep venous thrombosis) with thrombophlebitis, left ([place]) 04/11/2019  ? Dyspnea on exertion 01/27/2018  ? Facet arthritis, degenerative, lumbar spine 03/18/2018  ? Family history of malignant neoplasm of breast   ? Generalized anxiety disorder 01/24/2019  ? Possible history of panic  attacks  ? Greater trochanteric bursitis of both hips 04/10/2018  ? Heart murmur 01/27/2018  ? History of breast cancer 03/18/2018  ? S/p bilateral mastectomy  Formatting of this note might be different from the original. S/p bilateral mastectomy  ? History of deep vein thrombosis (DVT) of lower extremity 10/02/2019  ? Left leg swelling 02/23/2019  ? Lumbar spondylosis 03/18/2018  ? Malignant neoplasm of breast (female), unspecified site   ? Malignant neoplasm of central portion of left female breast ([place])   ? Mitral valve prolapse 01/27/2018  ? Obstructive sleep apnea   ? Variable CPAP use.  ? Other idiopathic scoliosis, lumbar region 03/18/2018  ? Pain of left calf 03/14/2019  ? Palpitations 01/25/2018  ? Pes planus of left foot 02/23/2019  ? Skin cancer   ? basal cell  ? Sprain of tarsometatarsal ligament of right foot 07/14/2019  ? Stasis dermatitis, acute  10/03/2019  ? Tenosynovitis of left ankle 04/11/2019  ? TIA (transient ischemic attack) 01/17/2019  ? Urge incontinence 07/28/2019  ? Varicose veins of left lower extremity   ? Ventricular tachycardia 02/28/2019

## 2021-06-17 ENCOUNTER — Ambulatory Visit (INDEPENDENT_AMBULATORY_CARE_PROVIDER_SITE_OTHER): Payer: Medicare Other | Admitting: Neurology

## 2021-06-17 ENCOUNTER — Encounter: Payer: Self-pay | Admitting: Neurology

## 2021-06-17 VITALS — BP 149/83 | HR 68 | Resp 20 | Ht 63.5 in | Wt 179.0 lb

## 2021-06-17 DIAGNOSIS — G629 Polyneuropathy, unspecified: Secondary | ICD-10-CM | POA: Insufficient documentation

## 2021-06-17 DIAGNOSIS — R351 Nocturia: Secondary | ICD-10-CM | POA: Diagnosis not present

## 2021-06-17 DIAGNOSIS — N135 Crossing vessel and stricture of ureter without hydronephrosis: Secondary | ICD-10-CM | POA: Diagnosis not present

## 2021-06-17 DIAGNOSIS — I679 Cerebrovascular disease, unspecified: Secondary | ICD-10-CM

## 2021-06-17 DIAGNOSIS — R4189 Other symptoms and signs involving cognitive functions and awareness: Secondary | ICD-10-CM

## 2021-06-17 DIAGNOSIS — I6522 Occlusion and stenosis of left carotid artery: Secondary | ICD-10-CM | POA: Diagnosis not present

## 2021-06-17 DIAGNOSIS — N3281 Overactive bladder: Secondary | ICD-10-CM | POA: Diagnosis not present

## 2021-06-17 HISTORY — DX: Polyneuropathy, unspecified: G62.9

## 2021-06-17 NOTE — Patient Instructions (Signed)
Repeat neurocognitive evaluation with Dr. Melvyn Novas  ?Follow up with me afterwards. ?

## 2021-06-20 ENCOUNTER — Encounter: Payer: Self-pay | Admitting: Hematology and Oncology

## 2021-06-20 ENCOUNTER — Inpatient Hospital Stay: Payer: Medicare Other | Attending: Hematology and Oncology | Admitting: Hematology and Oncology

## 2021-06-20 DIAGNOSIS — R0782 Intercostal pain: Secondary | ICD-10-CM | POA: Diagnosis not present

## 2021-06-20 DIAGNOSIS — Z86718 Personal history of other venous thrombosis and embolism: Secondary | ICD-10-CM | POA: Diagnosis not present

## 2021-06-20 DIAGNOSIS — Z171 Estrogen receptor negative status [ER-]: Secondary | ICD-10-CM

## 2021-06-20 DIAGNOSIS — C50112 Malignant neoplasm of central portion of left female breast: Secondary | ICD-10-CM

## 2021-06-20 DIAGNOSIS — R079 Chest pain, unspecified: Secondary | ICD-10-CM | POA: Diagnosis not present

## 2021-06-20 DIAGNOSIS — G459 Transient cerebral ischemic attack, unspecified: Secondary | ICD-10-CM | POA: Diagnosis not present

## 2021-06-20 DIAGNOSIS — I82412 Acute embolism and thrombosis of left femoral vein: Secondary | ICD-10-CM

## 2021-06-20 NOTE — Assessment & Plan Note (Signed)
History of stage I triple negative breast cancer, diagnosed January 2012, status post bilateral mastectomy. She remains without evidence of recurrence. Genetic testing in 2015 was negative. She has left sided rib pain and states this has been there for several months. She did have an ED visit in April for a fall; however, no imaging was done of the chest. I will send for CXR today to evaluate. Physical exam is benign.  ?

## 2021-06-20 NOTE — Assessment & Plan Note (Signed)
Deep venous thrombosis of the left lower extremity in May 2021. ?

## 2021-06-20 NOTE — Assessment & Plan Note (Signed)
Provoked deep venous thrombosis of the right lower extremity in February 2022, for which she continues aspirin  81 mg daily daily ?

## 2021-06-20 NOTE — Progress Notes (Signed)
Patient Care Team: Nicoletta Dress, MD as PCP - General (Internal Medicine) Pieter Partridge, DO as Consulting Physician (Neurology)  Clinic Day:  06/20/2021  Referring physician: Nicoletta Dress, MD  ASSESSMENT & PLAN:   Assessment & Plan: Malignant neoplasm of central portion of left female breast [facility]) History of stage I triple negative breast cancer, diagnosed January 2012, status post bilateral mastectomy. She remains without evidence of recurrence. Genetic testing in 2015 was negative. She has left sided rib pain and states this has been there for several months. She did have an ED visit in April for a fall; however, no imaging was done of the chest. I will send for CXR today to evaluate. Physical exam is benign.   TIA (transient ischemic attack) History of transient ischemic attacks, and in June 2022 had a CVA.  This impaired her memory, but she has been placed on medication with improvement. She continues physical therapy. She continues to follow with Dr. Tomi Likens.  DVT femoral (deep venous thrombosis) with thrombophlebitis, left (HCC) Deep venous thrombosis of the left lower extremity in May 2021.  History of deep vein thrombosis (DVT) of lower extremity Provoked deep venous thrombosis of the right lower extremity in February 2022, for which she continues aspirin  81 mg daily daily    The patient understands the plans discussed today and is in agreement with them.  She knows to contact our office if she develops concerns prior to her next appointment.     Melodye Ped, NP  [facility] [address] [place] [address] Dept: [phone] Dept Fax: [phone]   No orders of the defined types were placed in this encounter.     CHIEF COMPLAINT:  CC: A 71 year old female with history of breast cancer here for 6 month evaluation  Current Treatment:  Surveillance  INTERVAL HISTORY:  Karen Shannon is  here today for repeat clinical assessment. She denies fevers or chills. She denies pain. Her appetite is good. Her weight has been stable.  I have reviewed the past medical history, past surgical history, social history and family history with the patient and they are unchanged from previous note.  ALLERGIES:  is allergic to clarithromycin, prednisone, sertraline, and sertraline hcl.  MEDICATIONS:  Current Outpatient Medications  Medication Sig Dispense Refill   ascorbic acid (VITAMIN C) 500 MG tablet Take 500 mg by mouth daily.     aspirin 81 MG chewable tablet Chew 81 mg by mouth daily.     atorvastatin (LIPITOR) 80 MG tablet Take 1 tablet (80 mg total) by mouth daily. 90 tablet 3   Cholecalciferol 25 MCG (1000 UT) capsule Take 1,000 Units by mouth daily.     dicyclomine (BENTYL) 10 MG capsule Take 10 mg by mouth as needed for spasms (IBS).     diphenoxylate-atropine (LOMOTIL) 2.5-0.025 MG tablet Take 1 tablet by mouth as needed for diarrhea or loose stools.     donepezil (ARICEPT) 10 MG tablet Take 10 mg by mouth at bedtime.     ergocalciferol (VITAMIN D2) 50000 units capsule Take 50,000 Units by mouth once a week.     furosemide (LASIX) 20 MG tablet Take 20 mg by mouth daily.     gabapentin (NEURONTIN) 100 MG capsule Take 100 mg by mouth. Takes 3 tabs total of 334m daily     HYDROcodone-acetaminophen (NORCO) 7.5-325 MG tablet Take 1 tablet by mouth every 6 (six) hours as needed for moderate  pain. 0.5 tablet as needed for bursitis and [place]     memantine (NAMENDA) 5 MG tablet Take 5 mg by mouth 2 (two) times daily.     metoprolol succinate (TOPROL-XL) 25 MG 24 hr tablet TAKE 1 TABLET BY MOUTH EVERY DAY 90 tablet 2   montelukast (SINGULAIR) 10 MG tablet Take 10 mg by mouth at bedtime.     ondansetron (ZOFRAN) 4 MG tablet Take 4 mg by mouth every 8 (eight) hours as needed for nausea or vomiting.     solifenacin (VESICARE) 10 MG tablet Take by mouth daily.     tamsulosin (FLOMAX) 0.4 MG CAPS  capsule      Tiotropium Bromide-Olodaterol 2.5-2.5 MCG/ACT AERS Inhale 2 puffs into the lungs 1 day or 1 dose.     tiZANidine (ZANAFLEX) 4 MG tablet Take by mouth.     vitamin B-12 (CYANOCOBALAMIN) 1000 MCG tablet Take 1,000 mcg by mouth daily.     No current facility-administered medications for this visit.    HISTORY OF PRESENT ILLNESS:   Oncology History  Malignant neoplasm of central portion of left female breast [facility])   Initial Diagnosis   Malignant neoplasm of central portion of left female breast ([place])   02/21/2010 Cancer Staging   Staging form: Breast, AJCC 7th Edition - Clinical stage from 02/21/2010: Stage IA (T1b, N0, M0) - Signed by Derwood Kaplan, MD on 12/21/2020 Staged by: Managing physician Diagnostic confirmation: Positive histology Specimen type: Excision Histopathologic type: Infiltrating duct carcinoma, NOS Stage prefix: Initial diagnosis Laterality: Left Tumor size (mm): 9 Method of lymph node assessment: Sentinel lymph node biopsy Histologic grade (G): G1 Lymph-vascular invasion (LVI): LVI not present (absent)/not identified Residual tumor (R): R0 - None Paget's disease: Negative Tumor grade (Scarff-Bloom-Richardson system): G1 Estrogen receptor status: Negative Progesterone receptor status: Negative HER2 status: Negative Stage used in treatment planning: Yes National guidelines used in treatment planning: Yes Type of national guideline used in treatment planning: NCCN    11/18/2013 Genetic Testing   Negative genetic testing on the BreastNext panel.  PALB2 p.L100F VUS.  The BreastNext gene panel offered by [employer] includes sequencing and rearrangement analysis for the following 17 genes:  ATM, BARD1, BRCA1, BRCA2, BRIP1, CDH1, CHEK2, MRE11A, MUTYH, NBN, NF1, PALB2, PTEN, RAD50, RAD51C, RAD51D, and TP53.   The report date was 11/18/2013.  UPDATE: PALB2 p.L100F has been reclassified to Likely Benign.  The amended report date is November 09, 2020.        REVIEW OF SYSTEMS:   Constitutional: Denies fevers, chills or abnormal weight loss Eyes: Denies blurriness of vision Ears, nose, mouth, throat, and face: Denies mucositis or sore throat Respiratory: Denies cough, dyspnea or wheezes Cardiovascular: Denies palpitation, chest discomfort or lower extremity swelling Gastrointestinal:  Denies nausea, heartburn or change in bowel habits Skin: Denies abnormal skin rashes Lymphatics: Denies new lymphadenopathy or easy bruising Neurological:Denies numbness, tingling or new weaknesses Behavioral/Psych: Mood is stable, no new changes  All other systems were reviewed with the patient and are negative.   VITALS:  Blood pressure 122/70, pulse 72, temperature 98.2 F (36.8 C), temperature source Oral, resp. rate 20, height 5' 3.5" (1.613 m), weight 176 lb 11.2 oz (80.2 kg), SpO2 98 %.  Wt Readings from Last 3 Encounters:  06/20/21 176 lb 11.2 oz (80.2 kg)  06/17/21 179 lb (81.2 kg)  04/12/21 176 lb (79.8 kg)    Body mass index is 30.81 kg/m.  Performance status (ECOG): 1 - Symptomatic but completely ambulatory  PHYSICAL EXAM:  GENERAL:alert, no distress and comfortable SKIN: skin color, texture, turgor are normal, no rashes or significant lesions EYES: normal, Conjunctiva are pink and non-injected, sclera clear OROPHARYNX:no exudate, no erythema and lips, buccal mucosa, and tongue normal  NECK: supple, thyroid normal size, non-tender, without nodularity LYMPH:  no palpable lymphadenopathy in the cervical, axillary or inguinal LUNGS: clear to auscultation and percussion with normal breathing effort HEART: regular rate & rhythm and no murmurs and no lower extremity edema ABDOMEN:abdomen soft, non-tender and normal bowel sounds Musculoskeletal:no cyanosis of digits and no clubbing  NEURO: alert & oriented x 3 with fluent speech, no focal motor/sensory deficits  LABORATORY DATA:  I have reviewed the data as listed No results found for:  NA, K, CL, CO2, GLUCOSE, BUN, CREATININE, CALCIUM, PROT, ALBUMIN, AST, ALT, ALKPHOS, BILITOT, GFRNONAA, GFRAA  No results found for: SPEP, UPEP  No results found for: WBC, NEUTROABS, HGB, HCT, MCV, PLT    Chemistry   No results found for: NA, K, CL, CO2, BUN, CREATININE, GLU No results found for: CALCIUM, ALKPHOS, AST, ALT, BILITOT     RADIOGRAPHIC STUDIES: I have personally reviewed the radiological images as listed and agreed with the findings in the report.   Exam(s): [id number] RAD/DG CHEST 2V  CLINICAL DATA: Anterior chest pain left-sided x1 year.  EXAM:  CHEST - 2 VIEW  COMPARISON: Radiograph January 21, 2021  FINDINGS:  The heart size and mediastinal contours are within normal limits. No  focal consolidation. No pleural effusion. No pneumothorax. No acute  osseous abnormality..   IMPRESSION:  No acute cardiopulmonary process.   Electronically Signed  By: Dahlia Bailiff M.D.  On: 06/20/2021 12:26

## 2021-06-20 NOTE — Assessment & Plan Note (Signed)
History of transient ischemic attacks, and in June 2022 had a CVA. ?This impaired her memory, but she has been placed on medication with improvement. She continues physical therapy. She continues to follow with Dr. Tomi Likens. ?

## 2021-06-25 DIAGNOSIS — L821 Other seborrheic keratosis: Secondary | ICD-10-CM | POA: Diagnosis not present

## 2021-06-25 DIAGNOSIS — L578 Other skin changes due to chronic exposure to nonionizing radiation: Secondary | ICD-10-CM | POA: Diagnosis not present

## 2021-06-25 DIAGNOSIS — C4442 Squamous cell carcinoma of skin of scalp and neck: Secondary | ICD-10-CM | POA: Diagnosis not present

## 2021-06-25 DIAGNOSIS — L570 Actinic keratosis: Secondary | ICD-10-CM | POA: Diagnosis not present

## 2021-06-26 DIAGNOSIS — M25532 Pain in left wrist: Secondary | ICD-10-CM | POA: Diagnosis not present

## 2021-06-26 DIAGNOSIS — M25531 Pain in right wrist: Secondary | ICD-10-CM | POA: Diagnosis not present

## 2021-06-26 DIAGNOSIS — M79644 Pain in right finger(s): Secondary | ICD-10-CM | POA: Diagnosis not present

## 2021-07-02 DIAGNOSIS — M79673 Pain in unspecified foot: Secondary | ICD-10-CM | POA: Diagnosis not present

## 2021-07-02 DIAGNOSIS — M216X2 Other acquired deformities of left foot: Secondary | ICD-10-CM | POA: Diagnosis not present

## 2021-07-02 DIAGNOSIS — G8929 Other chronic pain: Secondary | ICD-10-CM | POA: Diagnosis not present

## 2021-07-02 DIAGNOSIS — M216X1 Other acquired deformities of right foot: Secondary | ICD-10-CM | POA: Diagnosis not present

## 2021-07-03 DIAGNOSIS — L909 Atrophic disorder of skin, unspecified: Secondary | ICD-10-CM

## 2021-07-03 DIAGNOSIS — M5416 Radiculopathy, lumbar region: Secondary | ICD-10-CM | POA: Diagnosis not present

## 2021-07-03 HISTORY — DX: Atrophic disorder of skin, unspecified: L90.9

## 2021-07-09 DIAGNOSIS — G4733 Obstructive sleep apnea (adult) (pediatric): Secondary | ICD-10-CM | POA: Diagnosis not present

## 2021-07-17 DIAGNOSIS — J4540 Moderate persistent asthma, uncomplicated: Secondary | ICD-10-CM | POA: Diagnosis not present

## 2021-07-17 DIAGNOSIS — R5383 Other fatigue: Secondary | ICD-10-CM | POA: Diagnosis not present

## 2021-07-17 DIAGNOSIS — G4733 Obstructive sleep apnea (adult) (pediatric): Secondary | ICD-10-CM | POA: Diagnosis not present

## 2021-07-23 DIAGNOSIS — M7062 Trochanteric bursitis, left hip: Secondary | ICD-10-CM | POA: Diagnosis not present

## 2021-07-23 DIAGNOSIS — M7061 Trochanteric bursitis, right hip: Secondary | ICD-10-CM | POA: Diagnosis not present

## 2021-07-24 DIAGNOSIS — M5416 Radiculopathy, lumbar region: Secondary | ICD-10-CM | POA: Diagnosis not present

## 2021-07-24 DIAGNOSIS — M7062 Trochanteric bursitis, left hip: Secondary | ICD-10-CM | POA: Diagnosis not present

## 2021-07-24 DIAGNOSIS — M7061 Trochanteric bursitis, right hip: Secondary | ICD-10-CM | POA: Diagnosis not present

## 2021-07-24 DIAGNOSIS — M4316 Spondylolisthesis, lumbar region: Secondary | ICD-10-CM | POA: Diagnosis not present

## 2021-07-25 DIAGNOSIS — C4442 Squamous cell carcinoma of skin of scalp and neck: Secondary | ICD-10-CM | POA: Diagnosis not present

## 2021-07-25 DIAGNOSIS — C4491 Basal cell carcinoma of skin, unspecified: Secondary | ICD-10-CM | POA: Insufficient documentation

## 2021-07-25 HISTORY — DX: Basal cell carcinoma of skin, unspecified: C44.91

## 2021-08-10 DIAGNOSIS — M19071 Primary osteoarthritis, right ankle and foot: Secondary | ICD-10-CM | POA: Diagnosis not present

## 2021-08-10 DIAGNOSIS — Z86718 Personal history of other venous thrombosis and embolism: Secondary | ICD-10-CM | POA: Diagnosis not present

## 2021-08-10 DIAGNOSIS — R002 Palpitations: Secondary | ICD-10-CM | POA: Diagnosis not present

## 2021-08-10 DIAGNOSIS — J4540 Moderate persistent asthma, uncomplicated: Secondary | ICD-10-CM | POA: Diagnosis not present

## 2021-08-10 DIAGNOSIS — M160 Bilateral primary osteoarthritis of hip: Secondary | ICD-10-CM | POA: Diagnosis not present

## 2021-08-10 DIAGNOSIS — R413 Other amnesia: Secondary | ICD-10-CM | POA: Diagnosis not present

## 2021-08-10 DIAGNOSIS — E785 Hyperlipidemia, unspecified: Secondary | ICD-10-CM | POA: Diagnosis not present

## 2021-08-10 DIAGNOSIS — E0590 Thyrotoxicosis, unspecified without thyrotoxic crisis or storm: Secondary | ICD-10-CM | POA: Diagnosis not present

## 2021-08-10 DIAGNOSIS — M19072 Primary osteoarthritis, left ankle and foot: Secondary | ICD-10-CM | POA: Diagnosis not present

## 2021-08-10 DIAGNOSIS — E559 Vitamin D deficiency, unspecified: Secondary | ICD-10-CM | POA: Diagnosis not present

## 2021-08-14 ENCOUNTER — Telehealth: Payer: Self-pay

## 2021-08-14 NOTE — Telephone Encounter (Addendum)
Per Gilles Chiquito - Dr Hinton Rao does have the pathology reports.  ----- Message from Derwood Kaplan, MD sent at 08/13/2021  9:39 AM EDT ----- Regarding: RE: Req an appt - new "massive skin cancer removed from head, down to scalp per pt" Contact: [phone] I see she is scheduled for 7/7, pls print any records, not in EPIC ----- Message ----- From: Dairl Ponder, RN Sent: 08/12/2021  11:54 AM EDT To: Derwood Kaplan, MD; Melodye Ped, NP Subject: Req an appt - new "massive skin cancer remov#  She states Dr Delena Bali told her she needed to see Dr Hinton Rao and get a PET scan scheduled. Please advise.

## 2021-08-15 NOTE — Progress Notes (Signed)
Patient Care Team: Karen Dress, MD as PCP - General (Internal Medicine) Karen Kaplan, MD as Consulting Physician (Oncology) Karen Partridge, DO as Consulting Physician (Neurology) Karen Neu, MD as Referring Physician (Dermatology)  Clinic Day: 08/16/21  Referring physician: Nicoletta Dress, MD  ASSESSMENT & PLAN:   Assessment & Plan: Malignant neoplasm of central portion of left female breast Karen Shannon) History of stage I triple negative breast cancer, diagnosed January 2012, status post bilateral mastectomy. She remains without evidence of recurrence. Genetic testing in 2015 was negative.  TIA (transient ischemic attack) History of transient ischemic attacks, and in June 2022 had a CVA.  This impaired her memory, but she has been placed on medication with improvement. She continues physical therapy. She continues to follow with Dr. Tomi Shannon.   DVT femoral (deep venous thrombosis) with thrombophlebitis, left (Karen Shannon) Deep venous thrombosis of Karen left lower extremity in May 2021.   History of deep vein thrombosis (DVT) of lower extremity Provoked deep venous thrombosis of Karen right lower extremity in February 2022, for which she continues aspirin  81 mg daily daily  Basal cell carcinoma of scalp Karen patient felt a nodule and Dr. Michele Shannon had resected a skin cancer years ago and so checks her regularly.  May of this year a biopsy was positive for basal cell carcinoma and so a wide excision was done but they were unable to close completely.  She has some sutures will which will come out next week.   I reassured her that her skin cancer has been treated appropriately and will just need to be monitored.  No further treatment is necessary.  I will have her keep her appointment on November 10 as scheduled for her usual breast cancer checkup.  She understands and agrees with this plan of care.  She knows to contact our office if she develops concerns prior to her next  appointment.     Karen Kaplan, MD  Karen Shannon [address] [place] Shannon [address] Dept: [phone] Dept Fax: [phone]   No orders of Karen defined types were placed in this encounter.     CHIEF COMPLAINT:  CC: A 71 year old female with history of breast cancer here for 6 month evaluation  Current Treatment:  Surveillance  INTERVAL HISTORY:  Karen Shannon is here today for repeat clinical assessment.  She is concerned about a recent nodule that she felt on her scalp and Dr. Michele Shannon had froze a lesion about 6 months ago.  He had resected a skin cancer years ago and does check her every 6 months.  In May Karen biopsy was positive for basal cell carcinoma and so a wide excision was done but they were unable to close it completely.  I have requested Karen pathology and margins are clear.  Her sutures will come out Monday.  She tells me Dr. Delena Shannon is treating her memory issues with 2 medications.  She continues to have problems with her spine and has scoliosis, degenerative disc disease, imbalance and falls.  Her gabapentin has been switched to Lyrica.  She will have an MRI scan of Karen brain in about 1 month.  She already has a scheduled appointment with me in November for her routine breast cancer follow-up.   She denies fevers or chills. She denies pain. Her appetite is good. Her weight has increased 5 pounds.  I have reviewed Karen past medical history, past surgical history, social history and  family history with Karen patient and they are unchanged from previous note.  ALLERGIES:  is allergic to clarithromycin, prednisone, sertraline, and sertraline hcl.  MEDICATIONS:  Current Outpatient Medications  Medication Sig Dispense Refill   ascorbic acid (VITAMIN C) 500 MG tablet Take 500 mg by mouth daily.     aspirin 81 MG chewable tablet Chew 81 mg by mouth daily.     atorvastatin (LIPITOR) 80 MG tablet Take 1 tablet (80 mg  total) by mouth daily. 90 tablet 3   Cholecalciferol 25 MCG (1000 UT) capsule Take 1,000 Units by mouth daily.     dicyclomine (BENTYL) 10 MG capsule Take 10 mg by mouth as needed for spasms (IBS).     diphenoxylate-atropine (LOMOTIL) 2.5-0.025 MG tablet Take 1 tablet by mouth as needed for diarrhea or loose stools.     donepezil (ARICEPT) 10 MG tablet Take 10 mg by mouth at bedtime.     ergocalciferol (VITAMIN D2) 50000 units capsule Take 50,000 Units by mouth once a week.     furosemide (LASIX) 20 MG tablet Take 20 mg by mouth daily.     HYDROcodone-acetaminophen (NORCO) 7.5-325 MG tablet Take 1 tablet by mouth every 6 (six) hours as needed for moderate pain. 0.5 tablet as needed for bursitis and Colony     memantine (NAMENDA) 5 MG tablet Take 5 mg by mouth 2 (two) times daily.     metoprolol succinate (TOPROL-XL) 25 MG 24 hr tablet TAKE 1 TABLET BY MOUTH EVERY DAY 90 tablet 2   montelukast (SINGULAIR) 10 MG tablet Take 10 mg by mouth at bedtime.     ondansetron (ZOFRAN) 4 MG tablet Take 4 mg by mouth every 8 (eight) hours as needed for nausea or vomiting.     pregabalin (LYRICA) 50 MG capsule Take 50 mg by mouth at bedtime.     solifenacin (VESICARE) 10 MG tablet Take by mouth daily.     tamsulosin (FLOMAX) 0.4 MG CAPS capsule      Tiotropium Bromide-Olodaterol 2.5-2.5 MCG/ACT AERS Inhale 2 puffs into Karen lungs 1 day or 1 dose.     tiZANidine (ZANAFLEX) 4 MG tablet Take by mouth.     vitamin B-12 (CYANOCOBALAMIN) 1000 MCG tablet Take 1,000 mcg by mouth daily.     No current facility-administered medications for this visit.    HISTORY OF PRESENT ILLNESS:   Oncology History  Malignant neoplasm of central portion of left female breast Karen Shannon)   Initial Diagnosis   Malignant neoplasm of central portion of left female breast (Karen Shannon)   02/21/2010 Cancer Staging   Staging form: Breast, AJCC 7th Edition - Clinical stage from 02/21/2010: Stage IA (T1b, N0, M0) - Signed by Karen Kaplan, MD on  12/21/2020 Staged by: Managing physician Diagnostic confirmation: Positive histology Specimen type: Excision Histopathologic type: Infiltrating duct carcinoma, NOS Stage prefix: Initial diagnosis Laterality: Left Tumor size (mm): 9 Method of lymph node assessment: Sentinel lymph node biopsy Histologic grade (G): G1 Lymph-vascular invasion (LVI): LVI not present (absent)/not identified Residual tumor (R): R0 - None Paget's disease: Negative Tumor grade (Scarff-Bloom-Richardson system): G1 Estrogen receptor status: Negative Progesterone receptor status: Negative HER2 status: Negative Stage used in treatment planning: Yes National guidelines used in treatment planning: Yes Type of national guideline used in treatment planning: NCCN   11/18/2013 Genetic Testing   Negative genetic testing on Karen BreastNext panel.  PALB2 p.L100F VUS.  Karen BreastNext gene panel offered by [facility] includes sequencing and rearrangement analysis for Karen following 17 genes:  ATM, BARD1, BRCA1, BRCA2, BRIP1, CDH1, CHEK2, MRE11A, MUTYH, NBN, NF1, PALB2, PTEN, RAD50, RAD51C, RAD51D, and TP53.   Karen report date was 11/18/2013.  UPDATE: PALB2 p.L100F has been reclassified to Likely Benign.  Karen amended report date is November 09, 2020.       REVIEW OF SYSTEMS:   Constitutional: Denies fevers, chills or abnormal weight loss Eyes: Denies blurriness of vision Ears, nose, mouth, throat, and face: Denies mucositis or sore throat Respiratory: Denies cough, dyspnea or wheezes Cardiovascular: Denies palpitation, chest discomfort or lower extremity swelling Gastrointestinal:  Denies nausea, heartburn or change in bowel habits Skin: Denies abnormal skin rashes Lymphatics: Denies new lymphadenopathy or easy bruising Neurological:Denies numbness, tingling or new weaknesses Behavioral/Psych: Mood is stable, no new changes  All other systems were reviewed with Karen patient and are negative.   VITALS:  Blood pressure  119/72, pulse 69, temperature 97.6 F (36.4 C), temperature source Oral, resp. rate 16, height 5' 3.5" (1.613 m), weight 181 lb 8 oz (82.3 kg), SpO2 96 %.  Wt Readings from Last 3 Encounters:  08/16/21 181 lb 8 oz (82.3 kg)  06/20/21 176 lb 11.2 oz (80.2 kg)  06/17/21 179 lb (81.2 kg)    Body mass index is 31.65 kg/m.  Performance status (ECOG): 1 - Symptomatic but completely ambulatory  PHYSICAL EXAM:   GENERAL:alert, no distress and comfortable SKIN: skin color, texture, turgor are normal, no rashes or significant lesions EYES: normal, Conjunctiva are pink and non-injected, sclera clear OROPHARYNX:no exudate, no erythema and lips, buccal mucosa, and tongue normal  NECK: supple, thyroid normal size, non-tender, without nodularity LYMPH:  no palpable lymphadenopathy in Karen cervical, axillary or inguinal LUNGS: clear to auscultation and percussion with normal breathing effort HEART: regular rate & rhythm and no murmurs and no lower extremity edema ABDOMEN:abdomen soft, non-tender and normal bowel sounds Musculoskeletal:no cyanosis of digits and no clubbing  NEURO: alert & oriented x 3 with fluent speech, no focal motor/sensory deficits  LABORATORY DATA:  I have reviewed Karen data as listed No results found for: "NA", "K", "CL", "CO2", "GLUCOSE", "BUN", "CREATININE", "CALCIUM", "PROT", "ALBUMIN", "AST", "ALT", "ALKPHOS", "BILITOT", "GFRNONAA", "GFRAA"  No results found for: "SPEP", "UPEP"  No results found for: "WBC", "NEUTROABS", "HGB", "HCT", "MCV", "PLT"    Chemistry   No results found for: "NA", "K", "CL", "CO2", "BUN", "CREATININE", "GLU" No results found for: "CALCIUM", "ALKPHOS", "AST", "ALT", "BILITOT"     RADIOGRAPHIC STUDIES: I have personally reviewed Karen radiological images as listed and agreed with Karen findings in Karen report.   Exam(s): [id number] RAD/DG CHEST 2V  CLINICAL DATA: Anterior chest pain left-sided x1 year.  EXAM:  CHEST - 2 VIEW  COMPARISON:  Radiograph January 21, 2021  FINDINGS:  Karen heart size and mediastinal contours are within normal limits. No  focal consolidation. No pleural effusion. No pneumothorax. No acute  osseous abnormality..   IMPRESSION:  No acute cardiopulmonary process.   Electronically Signed  By: Dahlia Bailiff M.D.  On: 06/20/2021 12:26

## 2021-08-16 ENCOUNTER — Encounter: Payer: Self-pay | Admitting: Oncology

## 2021-08-16 ENCOUNTER — Other Ambulatory Visit: Payer: Self-pay | Admitting: Oncology

## 2021-08-16 ENCOUNTER — Inpatient Hospital Stay: Payer: Medicare Other | Attending: Hematology and Oncology | Admitting: Oncology

## 2021-08-16 VITALS — BP 119/72 | HR 69 | Temp 97.6°F | Resp 16 | Ht 63.5 in | Wt 181.5 lb

## 2021-08-16 DIAGNOSIS — Z171 Estrogen receptor negative status [ER-]: Secondary | ICD-10-CM | POA: Diagnosis not present

## 2021-08-16 DIAGNOSIS — C4490 Unspecified malignant neoplasm of skin, unspecified: Secondary | ICD-10-CM | POA: Diagnosis not present

## 2021-08-16 DIAGNOSIS — C50112 Malignant neoplasm of central portion of left female breast: Secondary | ICD-10-CM | POA: Diagnosis not present

## 2021-09-05 DIAGNOSIS — H40013 Open angle with borderline findings, low risk, bilateral: Secondary | ICD-10-CM | POA: Diagnosis not present

## 2021-09-05 DIAGNOSIS — H524 Presbyopia: Secondary | ICD-10-CM | POA: Diagnosis not present

## 2021-09-05 DIAGNOSIS — H43813 Vitreous degeneration, bilateral: Secondary | ICD-10-CM | POA: Diagnosis not present

## 2021-09-05 DIAGNOSIS — H04123 Dry eye syndrome of bilateral lacrimal glands: Secondary | ICD-10-CM | POA: Diagnosis not present

## 2021-09-05 DIAGNOSIS — H25813 Combined forms of age-related cataract, bilateral: Secondary | ICD-10-CM | POA: Diagnosis not present

## 2021-09-05 NOTE — Progress Notes (Shared)
[facility] Note  09/17/2021     CHIEF COMPLAINT Patient presents for Retina Follow Up   HISTORY OF PRESENT ILLNESS: Karen Shannon is a 71 y.o. female who presents to the clinic today for:   HPI     Retina Follow Up   Patient presents with  Other.  In left eye.  Severity is moderate.  Duration of 8 months.  Since onset it is stable.  I, the attending physician,  performed the HPI with the patient and updated documentation appropriately.        Comments   Pt here for 8 mo ret f/u for VR tuft OS. Pt states shes seen some blurriness due to meds she was placed on for her pinched nerves (gabapentin first then pregabalin instead). Also reports she had an apt w/ Dr. Lucianne Lei on 7/27 who put her on dorzolamide BID OD due to elevated IOP (40 OD). Pt having FOL in OU now for over a year, progressively worsened at this point especially when she turns her head.        Last edited by Bernarda Caffey, MD on 09/18/2021  1:01 PM.    Pt saw Dr. Lucianne Lei on July 27 and was put on dorzolamide BID OD (IOP was 40 in Van's office), pt fell in April and hit her head and got a concussion, she had basal cell carcinoma take off her head in May, Dr. Lucianne Lei replaced her punctal plugs   Referring physician: Lisabeth Pick, MD [place],  [place] [address]  HISTORICAL INFORMATION:  Selected notes from the MEDICAL RECORD NUMBER Referred by Dr. Kathlen Mody for eval of PVD OU LEE: 10.20.2022 Ocular Hx-PVD OU, cataracts, glc suspect   CURRENT MEDICATIONS: No current outpatient medications on file. (Ophthalmic Drugs)   No current facility-administered medications for this visit. (Ophthalmic Drugs)   Current Outpatient Medications (Other)  Medication Sig   ascorbic acid (VITAMIN C) 500 MG tablet Take 500 mg by mouth daily.   aspirin 81 MG chewable tablet Chew 81 mg by mouth daily.   Cholecalciferol 25 MCG (1000 UT) capsule Take 1,000 Units by mouth daily.   dicyclomine  (BENTYL) 10 MG capsule Take 10 mg by mouth as needed for spasms (IBS).   diphenoxylate-atropine (LOMOTIL) 2.5-0.025 MG tablet Take 1 tablet by mouth as needed for diarrhea or loose stools.   donepezil (ARICEPT) 10 MG tablet Take 10 mg by mouth at bedtime.   ergocalciferol (VITAMIN D2) 50000 units capsule Take 50,000 Units by mouth once a week.   furosemide (LASIX) 20 MG tablet Take 20 mg by mouth daily.   HYDROcodone-acetaminophen (NORCO) 7.5-325 MG tablet Take 1 tablet by mouth every 6 (six) hours as needed for moderate pain. 0.5 tablet as needed for bursitis and    memantine (NAMENDA) 5 MG tablet Take 5 mg by mouth 2 (two) times daily.   metoprolol succinate (TOPROL-XL) 25 MG 24 hr tablet TAKE 1 TABLET BY MOUTH EVERY DAY   montelukast (SINGULAIR) 10 MG tablet Take 10 mg by mouth at bedtime.   ondansetron (ZOFRAN) 4 MG tablet Take 4 mg by mouth every 8 (eight) hours as needed for nausea or vomiting.   pregabalin (LYRICA) 50 MG capsule Take 50 mg by mouth at bedtime.   solifenacin (VESICARE) 10 MG tablet Take by mouth daily.   tamsulosin (FLOMAX) 0.4 MG CAPS capsule    tiZANidine (ZANAFLEX) 4 MG tablet Take by mouth.   vitamin B-12 (CYANOCOBALAMIN) 1000 MCG  tablet Take 1,000 mcg by mouth daily.   atorvastatin (LIPITOR) 80 MG tablet Take 1 tablet (80 mg total) by mouth daily.   Tiotropium Bromide-Olodaterol 2.5-2.5 MCG/ACT AERS Inhale 2 puffs into the lungs 1 day or 1 dose.   No current facility-administered medications for this visit. (Other)   REVIEW OF SYSTEMS: ROS   Positive for: Neurological, Skin, Musculoskeletal, Cardiovascular, Eyes, Respiratory Negative for: Constitutional, Gastrointestinal, Genitourinary, HENT, Endocrine, Psychiatric, Allergic/Imm, Heme/Lymph Last edited by Kingsley Spittle, COT on 09/17/2021  1:03 PM.     ALLERGIES Allergies  Allergen Reactions   Clarithromycin Diarrhea and Other (See Comments)   Prednisone Other (See Comments)    Passed out Other  reaction(s): other   Sertraline Diarrhea   Sertraline Hcl Diarrhea   PAST MEDICAL HISTORY Past Medical History:  Diagnosis Date   Acute deep vein thrombosis (DVT) of left peroneal vein (HCC) 04/11/2019   Alzheimer's dementia ([place])    Cervical spondylosis 03/18/2018   Chronic bilateral low back pain without sciatica 03/18/2018   Chronic foot pain, left 03/14/2019   Formatting of this note might be different from the original. Possible fracture base 2nd metatarsal   Chronic neck pain 03/18/2018   COPD (chronic obstructive pulmonary disease) ([place])    Dr. Delena Bali   DDD (degenerative disc disease), cervical 03/18/2018   Disp fx of fifth metatarsal bone, r foot, init for opn fx    DVT (deep venous thrombosis) ([place]) 03/2020   DVT femoral (deep venous thrombosis) with thrombophlebitis, left ([place]) 04/11/2019   Dyspnea on exertion 01/27/2018   Facet arthritis, degenerative, lumbar spine 03/18/2018   Family history of malignant neoplasm of breast    Generalized anxiety disorder 01/24/2019   Possible history of panic attacks   Greater trochanteric bursitis of both hips 04/10/2018   Heart murmur 01/27/2018   History of breast cancer 03/18/2018   S/p bilateral mastectomy  Formatting of this note might be different from the original. S/p bilateral mastectomy   History of deep vein thrombosis (DVT) of lower extremity 10/02/2019   Left leg swelling 02/23/2019   Lumbar spondylosis 03/18/2018   Malignant neoplasm of breast (female), unspecified site    Malignant neoplasm of central portion of left female breast ([place])    Mitral valve prolapse 01/27/2018   Obstructive sleep apnea    Variable CPAP use.   Other idiopathic scoliosis, lumbar region 03/18/2018   Pain of left calf 03/14/2019   Palpitations 01/25/2018   Pes planus of left foot 02/23/2019   Skin cancer    basal cell   Sprain of tarsometatarsal ligament of right foot 07/14/2019   Stasis dermatitis, acute 10/03/2019   Tenosynovitis of  left ankle 04/11/2019   TIA (transient ischemic attack) 01/17/2019   Urge incontinence 07/28/2019   Varicose veins of left lower extremity    Ventricular tachycardia ([place]) 02/28/2019   Past Surgical History:  Procedure Laterality Date   BUNIONECTOMY Right 1999   MASTECTOMY Bilateral 2012   [place]   SKIN CANCER EXCISION  2018   Top of head    SKIN CANCER EXCISION  07/2019   Nose   TENDON TRANSFER Right 1999   foot   FAMILY HISTORY Family History  Problem Relation Age of Onset   Lymphoma Mother 14       deceased 95   Breast cancer Maternal Aunt        Dx 36s; Deceased 24s   Lung cancer Maternal Uncle    Stomach cancer Paternal  Uncle        deceased 38   Liver cancer Maternal Uncle    Prostate cancer Other        3 mat cousins   Breast cancer Other 18       mother's mat half-sister   Emphysema Father    SOCIAL HISTORY Social History   Tobacco Use   Smoking status: Never   Smokeless tobacco: Never  Vaping Use   Vaping Use: Never used  Substance Use Topics   Alcohol use: Not Currently   Drug use: Never       OPHTHALMIC EXAM: Base Eye Exam     Visual Acuity (Snellen - Linear)       Right Left   Dist [place] 20/20 20/20         Tonometry (Tonopen, 1:08 PM)       Right Left   Pressure 12 13         Pupils       Dark Light Shape React APD   Right 3 2 Round Brisk None   Left 3 2 Round Brisk None         Visual Fields (Counting fingers)       Left Right    Full Full         Extraocular Movement       Right Left    Full, Ortho Full, Ortho         Neuro/Psych     Oriented x3: Yes   Mood/Affect: Normal         Dilation     Both eyes: 1.0% Mydriacyl, 2.5% Phenylephrine @ 1:09 PM           Slit Lamp and Fundus Exam     External Exam       Right Left   External Normal Normal         Slit Lamp Exam       Right Left   Lids/Lashes Dermatochalasis - upper lid, mild MGD Dermatochalasis - upper lid,  Ptosis UL   Conjunctiva/Sclera White and quiet White and quiet   Cornea mild arcus, tear film debris, trace PEE mild arcus, tear film debris, trace PEE   Anterior Chamber Deep and quiet Deep and quiet   Iris Round and dilated Round and dilated   Lens 2-3+ Nuclear sclerosis, 2-3+ Cortical cataract 2-3+ Nuclear sclerosis, 2-3+ Cortical cataract   Anterior Vitreous Mild syneresis, Posterior vitreous detachment, mild vitreous condensations Mild syneresis, Posterior vitreous detachment, mild vitreous condensations         Fundus Exam       Right Left   Disc Pink and Sharp, +cupping Pink and Sharp   C/D Ratio 0.65 0.6   Macula Flat, Blunted foveal reflex, RPE mottling, rare, fine drusen, No heme or edema Flat, Blunted foveal reflex, RPE mottling, rare, fine drusen, No heme or edema   Vessels mild attenuation, mild tortuosity attenuated, Tortuous   Periphery Attached, mild reticular degeneration, No RT/RD, No heme Attached, focal pigmented CR scars with VR tuft at 0100 -- good laser changes, reticular degeneration, no new RT/RD           IMAGING AND PROCEDURES  Imaging and Procedures for 09/17/2021  OCT, Retina - OU - Both Eyes       Right Eye Quality was good. Central Foveal Thickness: 263. Progression has been stable. Findings include normal foveal contour, no IRF, no SRF, retinal drusen .   Left Eye Quality was good. Central  Foveal Thickness: 266. Progression has been stable. Findings include normal foveal contour, no IRF, no SRF (Mild drusen).   Notes *Images captured and stored on drive  Diagnosis / Impression:  NFP, no IRF/SRF OU  Clinical management:  See below  Abbreviations: NFP - Normal foveal profile. CME - cystoid macular edema. PED - pigment epithelial detachment. IRF - intraretinal fluid. SRF - subretinal fluid. EZ - ellipsoid zone. ERM - epiretinal membrane. ORA - outer retinal atrophy. ORT - outer retinal tubulation. SRHM - subretinal hyper-reflective material.  IRHM - intraretinal hyper-reflective material            ASSESSMENT/PLAN:    ICD-10-CM   1. Vitreoretinal tuft of left eye  Q14.1     2. Left retinal defect  H33.302     3. Posterior vitreous detachment of both eyes  H43.813     4. Early dry stage nonexudative age-related macular degeneration of both eyes  H35.3131 OCT, Retina - OU - Both Eyes    5. Essential hypertension  I10     6. Hypertensive retinopathy of both eyes  H35.033     7. Combined forms of age-related cataract of both eyes  H25.813      1-2 Pigmented CR scar with VR tuft OS  - pt with h/o PVD OS and persistent flashes/floaters  - exam shows pigmented CR scar with VR tuft at 0100  - laser retinopexy OS 11.08.22 -- good early laser changes in place  - no new RT/RD - f/u 9 months, DFE, OCT  3. PVD / vitreous syneresis OU  - symptomatic flashes/floaters -- improved  - Discussed findings and prognosis  - No RT or RD on 360 scleral depressed exam  - Reviewed s/s of RT/RD  - Strict return precautions for any such RT/RD signs/symptoms  4. Age related macular degeneration, non-exudative, both eyes  - early stage with early fine drusen  - The incidence, anatomy, and pathology of dry AMD, risk of progression, and the AREDS and AREDS 2 study including smoking risks discussed with patient.  - Recommend amsler grid monitoring  - f/u 9 months  5,6. Hypertensive retinopathy OU - discussed importance of tight BP control - monitor  7. Mixed Cataract OU - The symptoms of cataract, surgical options, and treatments and risks were discussed with patient. - discussed diagnosis and progression - monitor  Ophthalmic Meds Ordered this visit:  No orders of the defined types were placed in this encounter.     Return in about 9 months (around 06/18/2022) for non-exu ARMD OU, DFE, OCT.  There are no Patient Instructions on file for this visit.  This document serves as a record of services personally performed by Gardiner Sleeper, MD, PhD. It was created on their behalf by Renaldo Reel, [place] an ophthalmic technician. The creation of this record is the provider's dictation and/or activities during the visit.    Electronically signed by:  Renaldo Reel, COT  09/05/21 1:04 PM  This document serves as a record of services personally performed by Gardiner Sleeper, MD, PhD. It was created on their behalf by San Jetty. Owens Shark, OA an ophthalmic technician. The creation of this record is the provider's dictation and/or activities during the visit.    Electronically signed by: San Jetty. Owens Shark, [place] 08.08.2023 1:04 PM  Gardiner Sleeper, M.D., Ph.D. Diseases & Surgery of the Retina and Vitreous [facility]  I have reviewed the above documentation for accuracy and completeness, and I  agree with the above. Gardiner Sleeper, M.D., Ph.D. 09/18/21 1:04 PM  Abbreviations: M myopia (nearsighted); A astigmatism; H hyperopia (farsighted); P presbyopia; Mrx spectacle prescription;  CTL contact lenses; OD right eye; OS left eye; OU both eyes  XT exotropia; ET esotropia; PEK punctate epithelial keratitis; PEE punctate epithelial erosions; DES dry eye syndrome; MGD meibomian gland dysfunction; ATs artificial tears; PFAT's preservative free artificial tears; [place] nuclear sclerotic cataract; PSC posterior subcapsular cataract; ERM epi-retinal membrane; PVD posterior vitreous detachment; RD retinal detachment; DM diabetes mellitus; DR diabetic retinopathy; NPDR non-proliferative diabetic retinopathy; PDR proliferative diabetic retinopathy; CSME clinically significant macular edema; DME diabetic macular edema; dbh dot blot hemorrhages; CWS cotton wool spot; POAG primary open angle glaucoma; C/D cup-to-disc ratio; HVF humphrey visual field; GVF goldmann visual field; OCT optical coherence tomography; IOP intraocular pressure; BRVO Branch retinal vein occlusion; CRVO central retinal vein occlusion; CRAO central retinal  artery occlusion; BRAO branch retinal artery occlusion; RT retinal tear; SB scleral buckle; PPV pars plana vitrectomy; VH Vitreous hemorrhage; PRP panretinal laser photocoagulation; IVK intravitreal kenalog; VMT vitreomacular traction; MH Macular hole;  NVD neovascularization of the disc; NVE neovascularization elsewhere; AREDS age related eye disease study; ARMD age related macular degeneration; POAG primary open angle glaucoma; EBMD epithelial/anterior basement membrane dystrophy; ACIOL anterior chamber intraocular lens; IOL intraocular lens; PCIOL posterior chamber intraocular lens; Phaco/IOL phacoemulsification with intraocular lens placement; Stark photorefractive keratectomy; LASIK laser assisted in situ keratomileusis; HTN hypertension; DM diabetes mellitus; COPD chronic obstructive pulmonary disease

## 2021-09-10 ENCOUNTER — Telehealth: Payer: Self-pay | Admitting: Oncology

## 2021-09-10 NOTE — Telephone Encounter (Signed)
09/10/21 lvm-Mri sched on 09/25/21 arrive at 11am.Appt at 1115am

## 2021-09-11 DIAGNOSIS — G4733 Obstructive sleep apnea (adult) (pediatric): Secondary | ICD-10-CM | POA: Diagnosis not present

## 2021-09-11 DIAGNOSIS — R5383 Other fatigue: Secondary | ICD-10-CM | POA: Diagnosis not present

## 2021-09-11 DIAGNOSIS — J4540 Moderate persistent asthma, uncomplicated: Secondary | ICD-10-CM | POA: Diagnosis not present

## 2021-09-17 ENCOUNTER — Encounter (INDEPENDENT_AMBULATORY_CARE_PROVIDER_SITE_OTHER): Payer: Self-pay | Admitting: Ophthalmology

## 2021-09-17 ENCOUNTER — Ambulatory Visit (INDEPENDENT_AMBULATORY_CARE_PROVIDER_SITE_OTHER): Payer: Medicare Other | Admitting: Ophthalmology

## 2021-09-17 DIAGNOSIS — H353131 Nonexudative age-related macular degeneration, bilateral, early dry stage: Secondary | ICD-10-CM

## 2021-09-17 DIAGNOSIS — H35033 Hypertensive retinopathy, bilateral: Secondary | ICD-10-CM

## 2021-09-17 DIAGNOSIS — H33302 Unspecified retinal break, left eye: Secondary | ICD-10-CM

## 2021-09-17 DIAGNOSIS — H43813 Vitreous degeneration, bilateral: Secondary | ICD-10-CM

## 2021-09-17 DIAGNOSIS — Q141 Congenital malformation of retina: Secondary | ICD-10-CM | POA: Diagnosis not present

## 2021-09-17 DIAGNOSIS — H25813 Combined forms of age-related cataract, bilateral: Secondary | ICD-10-CM | POA: Diagnosis not present

## 2021-09-17 DIAGNOSIS — I10 Essential (primary) hypertension: Secondary | ICD-10-CM

## 2021-09-18 ENCOUNTER — Encounter (INDEPENDENT_AMBULATORY_CARE_PROVIDER_SITE_OTHER): Payer: Self-pay | Admitting: Ophthalmology

## 2021-09-20 ENCOUNTER — Telehealth: Payer: Self-pay

## 2021-09-20 NOTE — Telephone Encounter (Signed)
MRI Brain is scheduled for 09/25/21 at 11am.

## 2021-09-20 NOTE — Telephone Encounter (Signed)
-----   Message from Derwood Kaplan, MD sent at 09/18/2021  7:43 PM EDT ----- Regarding: MRI She was supposed to get an MRI brain, can you see if it was done?

## 2021-09-24 DIAGNOSIS — M5416 Radiculopathy, lumbar region: Secondary | ICD-10-CM | POA: Diagnosis not present

## 2021-09-25 DIAGNOSIS — C4490 Unspecified malignant neoplasm of skin, unspecified: Secondary | ICD-10-CM | POA: Diagnosis not present

## 2021-09-25 DIAGNOSIS — I6782 Cerebral ischemia: Secondary | ICD-10-CM | POA: Diagnosis not present

## 2021-09-25 DIAGNOSIS — H40013 Open angle with borderline findings, low risk, bilateral: Secondary | ICD-10-CM | POA: Diagnosis not present

## 2021-09-30 ENCOUNTER — Telehealth: Payer: Self-pay

## 2021-09-30 NOTE — Telephone Encounter (Signed)
Patient notified

## 2021-09-30 NOTE — Telephone Encounter (Signed)
-----   Message from Derwood Kaplan, MD sent at 09/30/2021  2:29 PM EDT ----- Regarding: call Tell her brain MRI all clear

## 2021-10-08 DIAGNOSIS — M47816 Spondylosis without myelopathy or radiculopathy, lumbar region: Secondary | ICD-10-CM | POA: Diagnosis not present

## 2021-10-08 DIAGNOSIS — R399 Unspecified symptoms and signs involving the genitourinary system: Secondary | ICD-10-CM | POA: Diagnosis not present

## 2021-10-08 DIAGNOSIS — R0781 Pleurodynia: Secondary | ICD-10-CM | POA: Diagnosis not present

## 2021-10-08 DIAGNOSIS — M419 Scoliosis, unspecified: Secondary | ICD-10-CM | POA: Diagnosis not present

## 2021-10-16 DIAGNOSIS — M4316 Spondylolisthesis, lumbar region: Secondary | ICD-10-CM | POA: Diagnosis not present

## 2021-10-16 DIAGNOSIS — M5416 Radiculopathy, lumbar region: Secondary | ICD-10-CM | POA: Diagnosis not present

## 2021-10-21 ENCOUNTER — Other Ambulatory Visit: Payer: Self-pay

## 2021-10-21 ENCOUNTER — Ambulatory Visit: Payer: Medicare Other | Attending: Cardiology | Admitting: Cardiology

## 2021-10-21 ENCOUNTER — Encounter: Payer: Self-pay | Admitting: Cardiology

## 2021-10-21 ENCOUNTER — Encounter: Payer: Self-pay | Admitting: Oncology

## 2021-10-21 ENCOUNTER — Other Ambulatory Visit (HOSPITAL_COMMUNITY): Payer: Self-pay

## 2021-10-21 VITALS — BP 124/78 | HR 85 | Ht 65.5 in | Wt 188.4 lb

## 2021-10-21 DIAGNOSIS — I341 Nonrheumatic mitral (valve) prolapse: Secondary | ICD-10-CM

## 2021-10-21 DIAGNOSIS — I739 Peripheral vascular disease, unspecified: Secondary | ICD-10-CM | POA: Diagnosis not present

## 2021-10-21 DIAGNOSIS — I6930 Unspecified sequelae of cerebral infarction: Secondary | ICD-10-CM

## 2021-10-21 DIAGNOSIS — R0609 Other forms of dyspnea: Secondary | ICD-10-CM | POA: Insufficient documentation

## 2021-10-21 DIAGNOSIS — R002 Palpitations: Secondary | ICD-10-CM

## 2021-10-21 DIAGNOSIS — I472 Ventricular tachycardia, unspecified: Secondary | ICD-10-CM

## 2021-10-21 DIAGNOSIS — M47816 Spondylosis without myelopathy or radiculopathy, lumbar region: Secondary | ICD-10-CM | POA: Diagnosis not present

## 2021-10-21 DIAGNOSIS — M7989 Other specified soft tissue disorders: Secondary | ICD-10-CM | POA: Diagnosis not present

## 2021-10-21 NOTE — Addendum Note (Signed)
Addended by: Darrel Reach on: 10/21/2021 05:21 PM   Modules accepted: Orders

## 2021-10-21 NOTE — Progress Notes (Signed)
Cardiology Office Note:    Date:  10/21/2021   ID:  Karen Shannon, DOB 1950/07/25, MRN [id number]  PCP:  Nicoletta Dress, MD  Cardiologist:  Jenne Campus, MD    Referring MD: Nicoletta Dress, MD   Chief Complaint  Patient presents with   Follow-up    History of Present Illness:    Karen Shannon is a 71 y.o. female with past medical history significant for provoked DVT, essential hypertension, nonsustained ventricular tachycardia.  Stratification of ventricular tachycardia included echocardiogram showing preserved ejection fraction, stress test done at that time showed no evidence of ischemia.  In June of last year she was find to have small lacunar stroke also carotic artery stenosis with 69% she did see vascular surgeon who very appropriately told her there is no need for surgery from that point review. She is coming today Karen Shannon for follow-up look like cardiac wise she is doing well but she described a lot of problems she fell down few times losing her balance she end up having some chronic back pain.  She is taking some narcotics for it and there is some contemplation about surgery.  She also was put on some medications that she thinks caused her leg swelling.  Denies have any cardiac complaints.  Past Medical History:  Diagnosis Date   Acute deep vein thrombosis (DVT) of left peroneal vein (HCC) 04/11/2019   Alzheimer's dementia ([place])    Cervical spondylosis 03/18/2018   Chronic bilateral low back pain without sciatica 03/18/2018   Chronic foot pain, left 03/14/2019   Formatting of this note might be different from the original. Possible fracture base 2nd metatarsal   Chronic neck pain 03/18/2018   COPD (chronic obstructive pulmonary disease) ([place])    Dr. Delena Bali   DDD (degenerative disc disease), cervical 03/18/2018   Disp fx of fifth metatarsal bone, r foot, init for opn fx    DVT (deep venous thrombosis) ([place]) 03/2020   DVT femoral (deep venous  thrombosis) with thrombophlebitis, left ([place]) 04/11/2019   Dyspnea on exertion 01/27/2018   Facet arthritis, degenerative, lumbar spine 03/18/2018   Family history of malignant neoplasm of breast    Generalized anxiety disorder 01/24/2019   Possible history of panic attacks   Greater trochanteric bursitis of both hips 04/10/2018   Heart murmur 01/27/2018   History of breast cancer 03/18/2018   S/p bilateral mastectomy  Formatting of this note might be different from the original. S/p bilateral mastectomy   History of deep vein thrombosis (DVT) of lower extremity 10/02/2019   Left leg swelling 02/23/2019   Lumbar spondylosis 03/18/2018   Malignant neoplasm of breast (female), unspecified site    Malignant neoplasm of central portion of left female breast ([place])    Mitral valve prolapse 01/27/2018   Obstructive sleep apnea    Variable CPAP use.   Other idiopathic scoliosis, lumbar region 03/18/2018   Pain of left calf 03/14/2019   Palpitations 01/25/2018   Pes planus of left foot 02/23/2019   Skin cancer    basal cell   Sprain of tarsometatarsal ligament of right foot 07/14/2019   Stasis dermatitis, acute 10/03/2019   Tenosynovitis of left ankle 04/11/2019   TIA (transient ischemic attack) 01/17/2019   Urge incontinence 07/28/2019   Varicose veins of left lower extremity    Ventricular tachycardia ([place]) 02/28/2019    Past Surgical History:  Procedure Laterality Date   basal cell removed     Head   BUNIONECTOMY Right 1999  MASTECTOMY Bilateral 2012   NASAL SEPTUM SURGERY  1996   SKIN CANCER EXCISION  2018   Top of head    SKIN CANCER EXCISION  07/2019   Nose   TENDON TRANSFER Right 1999   foot    Current Medications: Current Meds  Medication Sig   ascorbic acid (VITAMIN C) 500 MG tablet Take 500 mg by mouth daily.   aspirin 81 MG chewable tablet Chew 81 mg by mouth daily.   atorvastatin (LIPITOR) 80 MG tablet Take 1 tablet (80 mg total) by mouth daily.    Cholecalciferol 25 MCG (1000 UT) capsule Take 1,000 Units by mouth daily.   dicyclomine (BENTYL) 10 MG capsule Take 10 mg by mouth as needed for spasms (IBS).   diphenoxylate-atropine (LOMOTIL) 2.5-0.025 MG tablet Take 1 tablet by mouth as needed for diarrhea or loose stools.   donepezil (ARICEPT) 10 MG tablet Take 10 mg by mouth at bedtime.   dorzolamide (TRUSOPT) 2 % ophthalmic solution Place 1 drop into the right eye 2 (two) times daily.   ergocalciferol (VITAMIN D2) 50000 units capsule Take 50,000 Units by mouth once a week.   furosemide (LASIX) 20 MG tablet Take 20 mg by mouth daily.   HYDROcodone-acetaminophen (NORCO) 7.5-325 MG tablet Take 1 tablet by mouth every 6 (six) hours as needed for moderate pain. 0.5 tablet as needed for bursitis and [place]   memantine (NAMENDA) 5 MG tablet Take 5 mg by mouth 2 (two) times daily.   metoprolol succinate (TOPROL-XL) 25 MG 24 hr tablet TAKE 1 TABLET BY MOUTH EVERY DAY (Patient taking differently: Take 25 mg by mouth daily.)   montelukast (SINGULAIR) 10 MG tablet Take 10 mg by mouth at bedtime.   ondansetron (ZOFRAN) 4 MG tablet Take 4 mg by mouth every 8 (eight) hours as needed for nausea or vomiting.   pregabalin (LYRICA) 50 MG capsule Take 50 mg by mouth at bedtime.   solifenacin (VESICARE) 10 MG tablet Take 10 mg by mouth daily.   tamsulosin (FLOMAX) 0.4 MG CAPS capsule Take 0.4 mg by mouth daily after supper.   tiZANidine (ZANAFLEX) 4 MG tablet Take 4 mg by mouth every 8 (eight) hours as needed for muscle spasms.   vitamin B-12 (CYANOCOBALAMIN) 1000 MCG tablet Take 1,000 mcg by mouth daily.     Allergies:   Clarithromycin, Prednisone, Sertraline, and Sertraline hcl   Social History   Socioeconomic History   Marital status: Single    Spouse name: Not on file   Number of children: 0   Years of education: 16   Highest education level: Bachelor's degree (e.g., BA, AB, BS)  Occupational History   Not on file  Tobacco Use   Smoking status:  Never   Smokeless tobacco: Never  Vaping Use   Vaping Use: Never used  Substance and Sexual Activity   Alcohol use: Not Currently   Drug use: Never   Sexual activity: Not Currently  Other Topics Concern   Not on file  Social History Narrative   Pt is single she lives alone has no children   Right handed   Social Determinants of Health   Financial Resource Strain: Not on file  Food Insecurity: Not on file  Transportation Needs: Not on file  Physical Activity: Not on file  Stress: Not on file  Social Connections: Not on file     Family History: [name] history includes Breast cancer in her maternal aunt; Breast cancer (age of onset: 35) in an  other family member; Emphysema in her father; Liver cancer in her maternal uncle; Lung cancer in her maternal uncle; Lymphoma (age of onset: 46) in her mother; Prostate cancer in an other family member; Stomach cancer in her paternal uncle. ROS:   Please see the history of present illness.    All 14 point review of systems negative except as described per history of present illness  EKGs/Labs/Other Studies Reviewed:      Recent Labs: No results found for requested labs within last 365 days.  Recent Lipid Panel    Component Value Date/Time   CHOL 174 02/28/2019 1447   TRIG 83 02/28/2019 1447   HDL 57 02/28/2019 1447   CHOLHDL 3.1 02/28/2019 1447   LDLCALC 102 (H) 02/28/2019 1447   LDLDIRECT 89 10/29/2020 1038    Physical Exam:    VS:  BP 124/78 (BP Location: Left Arm, Patient Position: Sitting)   Pulse 85   Ht 5' 5.5" (1.664 m)   Wt 188 lb 6.4 oz (85.5 kg)   SpO2 91%   BMI 30.87 kg/m     Wt Readings from Last 3 Encounters:  10/21/21 188 lb 6.4 oz (85.5 kg)  08/16/21 181 lb 8 oz (82.3 kg)  06/20/21 176 lb 11.2 oz (80.2 kg)     GEN:  Well nourished, well developed in no acute distress HEENT: Normal NECK: No JVD; No carotid bruits LYMPHATICS: No lymphadenopathy CARDIAC: RRR, no murmurs, no rubs, no  gallops RESPIRATORY:  Clear to auscultation without rales, wheezing or rhonchi  ABDOMEN: Soft, non-tender, non-distended MUSCULOSKELETAL:  No edema; No deformity  SKIN: Warm and dry LOWER EXTREMITIES: no swelling NEUROLOGIC:  Alert and oriented x 3 PSYCHIATRIC:  Normal affect   ASSESSMENT:    1. Ventricular tachycardia ([place])   2. Peripheral vascular disease, unspecified ([place]) up to 69% stenosis on the lef internal carotic artery based on cardiac ultrasounds from June 2022 t   3. Lumbar spondylosis   4. Palpitations   5. Late effect of cerebrovascular accident (CVA) right basal ganglion noted on MRI in June 2022 felt to be small vessel disease but   6. Mitral valve prolapse    PLAN:    In order of problems listed above:  Ventricle tachycardia denies have any dizziness or passing out.  It looks like she is passed since falling down because of balance loss. Peripheral vascular disease.  We will schedule her to have coronary Ultrasound. Let effect of CVA: Stable. Swelling of lower extremities very minimal on the physical exam.  I will check Chem-7 as well as proBNP, we will also schedule her to have echocardiogram to assess left ventricle ejection fraction. Dyslipidemia: I did review K PN which show me LDL of 77 HDL 60.  We will continue present management which include high intense statin form of Lipitor 80.   Medication Adjustments/Labs and Tests Ordered: Current medicines are reviewed at length with the patient today.  Concerns regarding medicines are outlined above.  No orders of the defined types were placed in this encounter.  Medication changes: No orders of the defined types were placed in this encounter.   Signed, Park Liter, MD, [facility] 10/21/2021 4:55 PM    [facility]

## 2021-10-21 NOTE — Patient Instructions (Signed)
Medication Instructions:  Your physician recommends that you continue on your current medications as directed. Please refer to the Current Medication list given to you today.  *If you need a refill on your cardiac medications before your next appointment, please call your pharmacy*   Lab Work: Your physician recommends that you return for lab work in: Pro BNP and BMP today If you have labs (blood work) drawn today and your tests are completely normal, you will receive your results only by: MyChart Message (if you have MyChart) OR A paper copy in the mail If you have any lab test that is abnormal or we need to change your treatment, we will call you to review the results.   Testing/Procedures: Your physician has requested that you have an echocardiogram. Echocardiography is a painless test that uses sound waves to create images of your heart. It provides your doctor with information about the size and shape of your heart and how well your heart's chambers and valves are working. This procedure takes approximately one hour. There are no restrictions for this procedure.     Follow-Up: At [facility], you and your health needs are our priority.  As part of our continuing mission to provide you with exceptional heart care, we have created designated Provider Care Teams.  These Care Teams include your primary Cardiologist (physician) and Advanced Practice Providers (APPs -  Physician Assistants and Nurse Practitioners) who all work together to provide you with the care you need, when you need it.  We recommend signing up for the patient portal called "MyChart".  Sign up information is provided on this After Visit Summary.  MyChart is used to connect with patients for Virtual Visits (Telemedicine).  Patients are able to view lab/test results, encounter notes, upcoming appointments, etc.  Non-urgent messages can be sent to your provider as well.   To learn more about what you can do with  MyChart, go to [email or web address].    Your next appointment:   6 month(s)  The format for your next appointment:   In Person  Provider:   Jenne Campus, MD    Other Instructions   Important Information About Sugar

## 2021-10-21 NOTE — Addendum Note (Signed)
Addended by: Darrel Reach on: 10/21/2021 05:00 PM   Modules accepted: Orders

## 2021-10-22 LAB — BASIC METABOLIC PANEL
BUN/Creatinine Ratio: 23 (ref 12–28)
BUN: 17 mg/dL (ref 8–27)
CO2: 26 mmol/L (ref 20–29)
Calcium: 9.4 mg/dL (ref 8.7–10.3)
Chloride: 107 mmol/L — ABNORMAL HIGH (ref 96–106)
Creatinine, Ser: 0.73 mg/dL (ref 0.57–1.00)
Glucose: 92 mg/dL (ref 70–99)
Potassium: 4.2 mmol/L (ref 3.5–5.2)
Sodium: 145 mmol/L — ABNORMAL HIGH (ref 134–144)
eGFR: 88 mL/min/{1.73_m2} (ref >59)

## 2021-10-22 LAB — PRO B NATRIURETIC PEPTIDE: NT-Pro BNP: 257 pg/mL (ref 0–301)

## 2021-10-31 ENCOUNTER — Other Ambulatory Visit: Payer: Self-pay | Admitting: Cardiology

## 2021-11-01 ENCOUNTER — Ambulatory Visit: Payer: Medicare Other | Attending: Cardiology

## 2021-11-01 DIAGNOSIS — I341 Nonrheumatic mitral (valve) prolapse: Secondary | ICD-10-CM | POA: Diagnosis not present

## 2021-11-01 DIAGNOSIS — R002 Palpitations: Secondary | ICD-10-CM | POA: Diagnosis not present

## 2021-11-01 DIAGNOSIS — I472 Ventricular tachycardia, unspecified: Secondary | ICD-10-CM | POA: Diagnosis not present

## 2021-11-01 DIAGNOSIS — I6930 Unspecified sequelae of cerebral infarction: Secondary | ICD-10-CM | POA: Diagnosis not present

## 2021-11-01 DIAGNOSIS — I739 Peripheral vascular disease, unspecified: Secondary | ICD-10-CM | POA: Diagnosis not present

## 2021-11-01 DIAGNOSIS — M47816 Spondylosis without myelopathy or radiculopathy, lumbar region: Secondary | ICD-10-CM | POA: Insufficient documentation

## 2021-11-02 LAB — ECHOCARDIOGRAM COMPLETE
AR max vel: 1.02 cm2
AV Area VTI: 0.97 cm2
AV Area mean vel: 0.87 cm2
AV Mean grad: 16 mmHg
AV Peak grad: 25.4 mmHg
Ao pk vel: 2.52 m/s
Area-P 1/2: 3.99 cm2
S' Lateral: 2.2 cm

## 2021-11-11 ENCOUNTER — Telehealth: Payer: Self-pay

## 2021-11-11 NOTE — Telephone Encounter (Signed)
I spoke with pt. She has noticed an area on top of her tongue that is sore and has been there a little over 1 wk. It is same color as her tongue and tender. She wanted to know if she should see Dr Hinton Rao first or ENT? I encouraged pt to see either dentist for an oral exam or an ENT.  If either of those physicians recommended a biopsy, then they can set that up for her. She wouldn't need to see Dr Hinton Rao unless pathology was positive for cancer.

## 2021-11-12 ENCOUNTER — Telehealth: Payer: Self-pay

## 2021-11-12 DIAGNOSIS — E041 Nontoxic single thyroid nodule: Secondary | ICD-10-CM | POA: Diagnosis not present

## 2021-11-12 DIAGNOSIS — Z853 Personal history of malignant neoplasm of breast: Secondary | ICD-10-CM | POA: Diagnosis not present

## 2021-11-12 DIAGNOSIS — D490 Neoplasm of unspecified behavior of digestive system: Secondary | ICD-10-CM | POA: Diagnosis not present

## 2021-11-12 DIAGNOSIS — Z85828 Personal history of other malignant neoplasm of skin: Secondary | ICD-10-CM | POA: Diagnosis not present

## 2021-11-12 DIAGNOSIS — J342 Deviated nasal septum: Secondary | ICD-10-CM | POA: Diagnosis not present

## 2021-11-12 NOTE — Telephone Encounter (Signed)
Unable to reach the patient, letter mailed requesting a call back

## 2021-11-12 NOTE — Telephone Encounter (Signed)
-----   Message from Park Liter, MD sent at 11/05/2021  1:32 PM EDT ----- Echocardiogram showed preserved left ventricle ejection fraction, diastolic dysfunction and mild aortic valve stenosis all of this for medical therapy

## 2021-11-12 NOTE — Progress Notes (Signed)
Unable to reach the patient, letter mailed requesting a call back

## 2021-11-16 DIAGNOSIS — M19071 Primary osteoarthritis, right ankle and foot: Secondary | ICD-10-CM | POA: Diagnosis not present

## 2021-11-16 DIAGNOSIS — Z1331 Encounter for screening for depression: Secondary | ICD-10-CM | POA: Diagnosis not present

## 2021-11-16 DIAGNOSIS — J4540 Moderate persistent asthma, uncomplicated: Secondary | ICD-10-CM | POA: Diagnosis not present

## 2021-11-16 DIAGNOSIS — R413 Other amnesia: Secondary | ICD-10-CM | POA: Diagnosis not present

## 2021-11-16 DIAGNOSIS — E0590 Thyrotoxicosis, unspecified without thyrotoxic crisis or storm: Secondary | ICD-10-CM | POA: Diagnosis not present

## 2021-11-16 DIAGNOSIS — E559 Vitamin D deficiency, unspecified: Secondary | ICD-10-CM | POA: Diagnosis not present

## 2021-11-16 DIAGNOSIS — M19072 Primary osteoarthritis, left ankle and foot: Secondary | ICD-10-CM | POA: Diagnosis not present

## 2021-11-16 DIAGNOSIS — Z23 Encounter for immunization: Secondary | ICD-10-CM | POA: Diagnosis not present

## 2021-11-16 DIAGNOSIS — Z86718 Personal history of other venous thrombosis and embolism: Secondary | ICD-10-CM | POA: Diagnosis not present

## 2021-11-16 DIAGNOSIS — E785 Hyperlipidemia, unspecified: Secondary | ICD-10-CM | POA: Diagnosis not present

## 2021-11-16 DIAGNOSIS — M160 Bilateral primary osteoarthritis of hip: Secondary | ICD-10-CM | POA: Diagnosis not present

## 2021-11-16 DIAGNOSIS — R002 Palpitations: Secondary | ICD-10-CM | POA: Diagnosis not present

## 2021-11-25 DIAGNOSIS — M5416 Radiculopathy, lumbar region: Secondary | ICD-10-CM | POA: Diagnosis not present

## 2021-11-25 DIAGNOSIS — G894 Chronic pain syndrome: Secondary | ICD-10-CM | POA: Diagnosis not present

## 2021-11-25 DIAGNOSIS — Z23 Encounter for immunization: Secondary | ICD-10-CM | POA: Diagnosis not present

## 2021-11-25 DIAGNOSIS — Z79891 Long term (current) use of opiate analgesic: Secondary | ICD-10-CM | POA: Diagnosis not present

## 2021-11-25 DIAGNOSIS — M4316 Spondylolisthesis, lumbar region: Secondary | ICD-10-CM | POA: Diagnosis not present

## 2021-11-25 DIAGNOSIS — Z79899 Other long term (current) drug therapy: Secondary | ICD-10-CM | POA: Diagnosis not present

## 2021-12-04 DIAGNOSIS — D490 Neoplasm of unspecified behavior of digestive system: Secondary | ICD-10-CM | POA: Diagnosis not present

## 2021-12-04 DIAGNOSIS — Q383 Other congenital malformations of tongue: Secondary | ICD-10-CM

## 2021-12-04 DIAGNOSIS — Z85828 Personal history of other malignant neoplasm of skin: Secondary | ICD-10-CM | POA: Diagnosis not present

## 2021-12-04 DIAGNOSIS — E041 Nontoxic single thyroid nodule: Secondary | ICD-10-CM | POA: Diagnosis not present

## 2021-12-04 HISTORY — DX: Other congenital malformations of tongue: Q38.3

## 2021-12-04 HISTORY — PX: TONGUE BIOPSY: SHX1075

## 2021-12-06 DIAGNOSIS — K140 Glossitis: Secondary | ICD-10-CM | POA: Diagnosis not present

## 2021-12-11 ENCOUNTER — Telehealth: Payer: Self-pay | Admitting: Cardiology

## 2021-12-11 DIAGNOSIS — J4540 Moderate persistent asthma, uncomplicated: Secondary | ICD-10-CM | POA: Diagnosis not present

## 2021-12-11 DIAGNOSIS — R5383 Other fatigue: Secondary | ICD-10-CM | POA: Diagnosis not present

## 2021-12-11 DIAGNOSIS — G4733 Obstructive sleep apnea (adult) (pediatric): Secondary | ICD-10-CM | POA: Diagnosis not present

## 2021-12-11 NOTE — Telephone Encounter (Signed)
Pt returning call from letter sent about echo results

## 2021-12-12 NOTE — Telephone Encounter (Signed)
Patient notified of results.

## 2021-12-19 ENCOUNTER — Other Ambulatory Visit: Payer: Self-pay

## 2021-12-20 ENCOUNTER — Encounter: Payer: Self-pay | Admitting: Oncology

## 2021-12-20 ENCOUNTER — Inpatient Hospital Stay: Payer: Medicare Other | Attending: Oncology | Admitting: Oncology

## 2021-12-20 VITALS — BP 133/78 | HR 77 | Temp 99.0°F | Resp 18 | Ht 65.5 in | Wt 190.2 lb

## 2021-12-20 DIAGNOSIS — Z171 Estrogen receptor negative status [ER-]: Secondary | ICD-10-CM | POA: Diagnosis not present

## 2021-12-20 DIAGNOSIS — Z780 Asymptomatic menopausal state: Secondary | ICD-10-CM

## 2021-12-20 DIAGNOSIS — M8580 Other specified disorders of bone density and structure, unspecified site: Secondary | ICD-10-CM | POA: Diagnosis not present

## 2021-12-20 DIAGNOSIS — C50112 Malignant neoplasm of central portion of left female breast: Secondary | ICD-10-CM

## 2021-12-20 DIAGNOSIS — I82452 Acute embolism and thrombosis of left peroneal vein: Secondary | ICD-10-CM

## 2021-12-20 NOTE — Progress Notes (Signed)
Patient Care Team: Karen Dress, MD as PCP - General (Internal Medicine) Karen Kaplan, MD as Consulting Physician (Oncology) Karen Partridge, DO as Consulting Physician (Neurology) Karen Neu, MD as Referring Physician (Dermatology)  Clinic Day: 12/20/21  Referring physician: Nicoletta Dress, MD  ASSESSMENT & PLAN:   Assessment & Plan: Malignant neoplasm of central portion of left female breast [facility]) History of stage I triple negative breast cancer, diagnosed January 2012, status post bilateral mastectomy. She remains without evidence of recurrence. Genetic testing in 2015 was negative.  TIA (transient ischemic attack) History of transient ischemic attacks, and in June 2022 had a CVA.  This impaired her memory, but she has been placed on medication with improvement.    DVT femoral (deep venous thrombosis) with thrombophlebitis, left (HCC) Deep venous thrombosis of the left lower extremity in May 2021. Recurrence provoked deep venous thrombosis of the right lower extremity in February 2022, for which she continues aspirin  81 mg daily daily  Basal cell carcinoma of scalp The patient felt a nodule and Dr. Michele Shannon had resected a skin cancer years ago and so checks her regularly.  May 2023 a biopsy was positive for basal cell carcinoma and so a wide excision was done but they were unable to close completely. Final pathology still showed residual basal cell carcinoma but with clear margins. She has healed well from this.  Tongue lesion Pathology was benign. I will plan to obtain this for review.   She will follow up in 6 months at her request for routine breast follow up. She understands and agrees with this plan of care.  She knows to contact our office if she develops concerns prior to her next appointment.     Karen Kaplan, MD  [name] [address] [place] [address] Dept:  [phone] Dept Fax: [phone]   No orders of the defined types were placed in this encounter.     CHIEF COMPLAINT:  CC: A 71 y.o. old female with history of breast cancer here for 6 month evaluation  Current Treatment:  Surveillance  INTERVAL HISTORY:  Karen Shannon is here today for repeat clinical assessment. She reports that she has been dealing with low back pain secondary to DDD with radicular symptoms. She had 2 spine injections without much relief. She is followed by ortho surg for this. Patient is taking Norco 7.5 and Lyrica 72m with some pain relief. However, she has had fluid retention and weight gain with the Lyrica, so she is taking Lasix 240mdaily. She states that her potassium was low in January with Dr. ScDelena BaliHer potassium was WNL at 4.2 on 10/21/21. She had surgery on her tongue on 12/04/21 due to suspected cancer. However, this was benign. Her tongue is healing well. She notes having an echo on 11/01/21 which showed LVDD but normal LVEF. She denies signs of infection such as sore throat, sinus drainage, cough, or urinary symptoms. She denies fevers or recurrent chills. She denies nausea, vomiting, chest pain, dyspnea or cough. Her appetite is good. She has gained 9lbs since July 2023.   I have reviewed the past medical history, past surgical history, social history and family history with the patient and they are unchanged from previous note.  ALLERGIES:  is allergic to clarithromycin, prednisone, sertraline, and sertraline hcl.  MEDICATIONS:  Current Outpatient Medications  Medication Sig Dispense Refill   ascorbic acid (VITAMIN C) 500 MG tablet Take  500 mg by mouth daily.     aspirin 81 MG chewable tablet Chew 81 mg by mouth daily.     atorvastatin (LIPITOR) 80 MG tablet Take 1 tablet by mouth once daily 90 tablet 2   Cholecalciferol 25 MCG (1000 UT) capsule Take 1,000 Units by mouth daily.     dicyclomine (BENTYL) 10 MG capsule Take 10 mg by mouth as needed for  spasms (IBS).     diphenoxylate-atropine (LOMOTIL) 2.5-0.025 MG tablet Take 1 tablet by mouth as needed for diarrhea or loose stools.     donepezil (ARICEPT) 10 MG tablet Take 10 mg by mouth at bedtime.     dorzolamide (TRUSOPT) 2 % ophthalmic solution Place 1 drop into the right eye 2 (two) times daily.     ergocalciferol (VITAMIN D2) 50000 units capsule Take 50,000 Units by mouth once a week.     furosemide (LASIX) 20 MG tablet Take 20 mg by mouth daily.     HYDROcodone-acetaminophen (NORCO) 7.5-325 MG tablet Take 1 tablet by mouth every 6 (six) hours as needed for moderate pain. 0.5 tablet as needed for bursitis and [place]     memantine (NAMENDA) 5 MG tablet Take 5 mg by mouth 2 (two) times daily.     metoprolol succinate (TOPROL-XL) 25 MG 24 hr tablet TAKE 1 TABLET BY MOUTH EVERY DAY (Patient taking differently: Take 25 mg by mouth daily.) 90 tablet 2   montelukast (SINGULAIR) 10 MG tablet Take 10 mg by mouth at bedtime.     ondansetron (ZOFRAN) 4 MG tablet Take 4 mg by mouth every 8 (eight) hours as needed for nausea or vomiting.     pregabalin (LYRICA) 50 MG capsule Take 50 mg by mouth at bedtime.     solifenacin (VESICARE) 10 MG tablet Take 10 mg by mouth daily.     tamsulosin (FLOMAX) 0.4 MG CAPS capsule Take 0.4 mg by mouth daily after supper.     tiZANidine (ZANAFLEX) 4 MG tablet Take 4 mg by mouth every 8 (eight) hours as needed for muscle spasms.     vitamin B-12 (CYANOCOBALAMIN) 1000 MCG tablet Take 1,000 mcg by mouth daily.     No current facility-administered medications for this visit.    HISTORY OF PRESENT ILLNESS:   Oncology History  Malignant neoplasm of central portion of left female breast [facility])   Initial Diagnosis   Malignant neoplasm of central portion of left female breast ([place])   02/21/2010 Cancer Staging   Staging form: Breast, AJCC 7th Edition - Clinical stage from 02/21/2010: Stage IA (T1b, N0, M0) - Signed by Karen Kaplan, MD on 12/21/2020 Staged by:  Managing physician Diagnostic confirmation: Positive histology Specimen type: Excision Histopathologic type: Infiltrating duct carcinoma, NOS Stage prefix: Initial diagnosis Laterality: Left Tumor size (mm): 9 Method of lymph node assessment: Sentinel lymph node biopsy Histologic grade (G): G1 Lymph-vascular invasion (LVI): LVI not present (absent)/not identified Residual tumor (R): R0 - None Paget's disease: Negative Tumor grade (Scarff-Bloom-Richardson system): G1 Estrogen receptor status: Negative Progesterone receptor status: Negative HER2 status: Negative Stage used in treatment planning: Yes National guidelines used in treatment planning: Yes Type of national guideline used in treatment planning: NCCN   11/18/2013 Genetic Testing   Negative genetic testing on the BreastNext panel.  PALB2 p.L100F VUS.  The BreastNext gene panel offered by [employer] includes sequencing and rearrangement analysis for the following 17 genes:  ATM, BARD1, BRCA1, BRCA2, BRIP1, CDH1, CHEK2, MRE11A, MUTYH, NBN, NF1, PALB2, PTEN, RAD50, RAD51C, RAD51D,  and TP53.   The report date was 11/18/2013.  UPDATE: PALB2 p.L100F has been reclassified to Likely Benign.  The amended report date is November 09, 2020.       REVIEW OF SYSTEMS:   Review of Systems  Constitutional:  Negative for appetite change, chills, fever and unexpected weight change.  HENT:  Negative.  Negative for lump/mass, mouth sores and sore throat.   Eyes: Negative.   Respiratory: Negative.  Negative for chest tightness, cough, hemoptysis, shortness of breath and wheezing.   Cardiovascular: Negative.  Negative for chest pain, leg swelling and palpitations.  Gastrointestinal: Negative.  Negative for abdominal distention, abdominal pain, blood in stool, constipation, diarrhea, nausea and vomiting.  Endocrine: Negative.   Genitourinary: Negative.  Negative for difficulty urinating, dysuria, frequency and hematuria.   Musculoskeletal:  Positive  for arthralgias, back pain and myalgias. Negative for flank pain and gait problem.  Skin: Negative.   Neurological:  Negative for dizziness, extremity weakness, gait problem, headaches, light-headedness, numbness, seizures and speech difficulty.  Hematological: Negative.  Negative for adenopathy. Does not bruise/bleed easily.  Psychiatric/Behavioral: Negative.  Negative for depression and sleep disturbance. The patient is not nervous/anxious.    VITALS:  Blood pressure 133/78, pulse 77, temperature 99 F (37.2 C), temperature source Oral, resp. rate 18, height 5' 5.5" (1.664 m), weight 190 lb 3.2 oz (86.3 kg), SpO2 100 %.  Wt Readings from Last 3 Encounters:  12/20/21 190 lb 3.2 oz (86.3 kg)  10/21/21 188 lb 6.4 oz (85.5 kg)  08/16/21 181 lb 8 oz (82.3 kg)    Body mass index is 31.17 kg/m.  Performance status (ECOG): 1 - Symptomatic but completely ambulatory  PHYSICAL EXAM:   Physical Exam Constitutional:      General: She is not in acute distress.    Appearance: Normal appearance. She is normal weight.  HENT:     Head: Normocephalic and atraumatic.     Nose:     Comments: Mild raised erythematous area at the bridge of the nose    Mouth/Throat:     Comments: Mild smoothness and erythema of the anterior 3rd of the tongue   Eyes:     General: No scleral icterus.    Extraocular Movements: Extraocular movements intact.     Conjunctiva/sclera: Conjunctivae normal.     Pupils: Pupils are equal, round, and reactive to light.  Cardiovascular:     Rate and Rhythm: Normal rate and regular rhythm.     Pulses: Normal pulses.     Heart sounds: Normal heart sounds. No murmur heard.    No friction rub. No gallop.     Comments: Mild BLE edema Pulmonary:     Effort: Pulmonary effort is normal. No respiratory distress.     Breath sounds: Normal breath sounds.  Chest:     Comments: Bilateral mastectomies are negative Abdominal:     General: Bowel sounds are normal. There is no  distension.     Palpations: Abdomen is soft. There is no hepatomegaly, splenomegaly or mass.     Tenderness: There is no abdominal tenderness.  Musculoskeletal:        General: Normal range of motion.     Cervical back: Normal range of motion and neck supple.     Right lower leg: Edema present.     Left lower leg: Edema present.  Lymphadenopathy:     Cervical: No cervical adenopathy.  Skin:    General: Skin is warm and dry.  Neurological:  General: No focal deficit present.     Mental Status: She is alert and oriented to person, place, and time. Mental status is at baseline.  Psychiatric:        Mood and Affect: Mood normal.        Behavior: Behavior normal.        Thought Content: Thought content normal.        Judgment: Judgment normal.      LABORATORY DATA:  I have reviewed the data as listed    Component Value Date/Time   NA 145 (H) 10/21/2021 1710   K 4.2 10/21/2021 1710   CL 107 (H) 10/21/2021 1710   CO2 26 10/21/2021 1710   GLUCOSE 92 10/21/2021 1710   BUN 17 10/21/2021 1710   CREATININE 0.73 10/21/2021 1710   CALCIUM 9.4 10/21/2021 1710    No results found for: "SPEP", "UPEP"  No results found for: "WBC", "NEUTROABS", "HGB", "HCT", "MCV", "PLT"    Chemistry      Component Value Date/Time   NA 145 (H) 10/21/2021 1710   K 4.2 10/21/2021 1710   CL 107 (H) 10/21/2021 1710   CO2 26 10/21/2021 1710   BUN 17 10/21/2021 1710   CREATININE 0.73 10/21/2021 1710      Component Value Date/Time   CALCIUM 9.4 10/21/2021 1710       RADIOGRAPHIC STUDIES: I have personally reviewed the radiological images as listed and agreed with the findings in the report. No results found.  Echo 11/01/2021 GLS -15.7. Left ventricular ejection fraction, by estimation, is 55 to 60%. The left ventricle has normal function. The left ventricle has no regional wall motion abnormalities. Left ventricular diastolic parameters are consistent with Grade II diastolic dysfunction  (pseudonormalization). Right ventricular systolic function is normal. The right ventricular size is normal. There is normal pulmonary artery systolic pressure. The mitral valve is normal in structure. No evidence of mitral valve regurgitation. No evidence of mitral stenosis. The aortic valve is normal in structure. There is mild thickening of the aortic valve. Aortic valve regurgitation is not visualized. Mild aortic valve stenosis. Aortic valve mean gradient measures 16.0 mmHg. The inferior vena cava is normal in size with greater than 50% respiratory variability, suggesting right atrial pressure of 3 mmHg.  MRI Head w wo contrast 10/21/2021   Chest XR 2 View 06/20/2021 CLINICAL DATA: Anterior chest pain left-sided x1 year.  EXAM:  CHEST - 2 VIEW  COMPARISON: Radiograph January 21, 2021  FINDINGS:  The heart size and mediastinal contours are within normal limits. No  focal consolidation. No pleural effusion. No pneumothorax. No acute  osseous abnormality..  IMPRESSION:  No acute cardiopulmonary process.      I,Alexis Herring,acting as a scribe for Karen Kaplan, MD.,have documented all relevant documentation on the behalf of Karen Kaplan, MD,as directed by  Karen Kaplan, MD while in the presence of Karen Kaplan, MD.

## 2021-12-23 DIAGNOSIS — M4316 Spondylolisthesis, lumbar region: Secondary | ICD-10-CM | POA: Diagnosis not present

## 2021-12-23 DIAGNOSIS — M7061 Trochanteric bursitis, right hip: Secondary | ICD-10-CM | POA: Diagnosis not present

## 2021-12-23 DIAGNOSIS — M7062 Trochanteric bursitis, left hip: Secondary | ICD-10-CM | POA: Diagnosis not present

## 2021-12-23 DIAGNOSIS — M5416 Radiculopathy, lumbar region: Secondary | ICD-10-CM | POA: Diagnosis not present

## 2022-01-20 DIAGNOSIS — M5416 Radiculopathy, lumbar region: Secondary | ICD-10-CM | POA: Diagnosis not present

## 2022-01-20 DIAGNOSIS — M7061 Trochanteric bursitis, right hip: Secondary | ICD-10-CM | POA: Diagnosis not present

## 2022-01-20 DIAGNOSIS — M7062 Trochanteric bursitis, left hip: Secondary | ICD-10-CM | POA: Diagnosis not present

## 2022-01-20 DIAGNOSIS — M4316 Spondylolisthesis, lumbar region: Secondary | ICD-10-CM | POA: Diagnosis not present

## 2022-01-30 DIAGNOSIS — R350 Frequency of micturition: Secondary | ICD-10-CM | POA: Diagnosis not present

## 2022-02-17 DIAGNOSIS — M7062 Trochanteric bursitis, left hip: Secondary | ICD-10-CM | POA: Diagnosis not present

## 2022-02-17 DIAGNOSIS — M7061 Trochanteric bursitis, right hip: Secondary | ICD-10-CM | POA: Diagnosis not present

## 2022-02-17 DIAGNOSIS — M5416 Radiculopathy, lumbar region: Secondary | ICD-10-CM | POA: Diagnosis not present

## 2022-02-17 DIAGNOSIS — M4316 Spondylolisthesis, lumbar region: Secondary | ICD-10-CM | POA: Diagnosis not present

## 2022-02-22 DIAGNOSIS — M19072 Primary osteoarthritis, left ankle and foot: Secondary | ICD-10-CM | POA: Diagnosis not present

## 2022-02-22 DIAGNOSIS — E785 Hyperlipidemia, unspecified: Secondary | ICD-10-CM | POA: Diagnosis not present

## 2022-02-22 DIAGNOSIS — R002 Palpitations: Secondary | ICD-10-CM | POA: Diagnosis not present

## 2022-02-22 DIAGNOSIS — R413 Other amnesia: Secondary | ICD-10-CM | POA: Diagnosis not present

## 2022-02-22 DIAGNOSIS — Z86718 Personal history of other venous thrombosis and embolism: Secondary | ICD-10-CM | POA: Diagnosis not present

## 2022-02-22 DIAGNOSIS — E559 Vitamin D deficiency, unspecified: Secondary | ICD-10-CM | POA: Diagnosis not present

## 2022-02-22 DIAGNOSIS — Z6833 Body mass index (BMI) 33.0-33.9, adult: Secondary | ICD-10-CM | POA: Diagnosis not present

## 2022-02-22 DIAGNOSIS — M160 Bilateral primary osteoarthritis of hip: Secondary | ICD-10-CM | POA: Diagnosis not present

## 2022-02-22 DIAGNOSIS — J4540 Moderate persistent asthma, uncomplicated: Secondary | ICD-10-CM | POA: Diagnosis not present

## 2022-02-22 DIAGNOSIS — M19071 Primary osteoarthritis, right ankle and foot: Secondary | ICD-10-CM | POA: Diagnosis not present

## 2022-02-22 DIAGNOSIS — N390 Urinary tract infection, site not specified: Secondary | ICD-10-CM | POA: Diagnosis not present

## 2022-02-22 DIAGNOSIS — R300 Dysuria: Secondary | ICD-10-CM | POA: Diagnosis not present

## 2022-02-22 DIAGNOSIS — E0590 Thyrotoxicosis, unspecified without thyrotoxic crisis or storm: Secondary | ICD-10-CM | POA: Diagnosis not present

## 2022-02-23 LAB — LAB REPORT - SCANNED: EGFR: 91

## 2022-02-24 DIAGNOSIS — M79604 Pain in right leg: Secondary | ICD-10-CM | POA: Diagnosis not present

## 2022-02-24 DIAGNOSIS — M4316 Spondylolisthesis, lumbar region: Secondary | ICD-10-CM | POA: Diagnosis not present

## 2022-02-24 DIAGNOSIS — R293 Abnormal posture: Secondary | ICD-10-CM | POA: Diagnosis not present

## 2022-02-24 DIAGNOSIS — M2569 Stiffness of other specified joint, not elsewhere classified: Secondary | ICD-10-CM | POA: Diagnosis not present

## 2022-02-24 DIAGNOSIS — R2681 Unsteadiness on feet: Secondary | ICD-10-CM | POA: Diagnosis not present

## 2022-02-26 DIAGNOSIS — R293 Abnormal posture: Secondary | ICD-10-CM | POA: Diagnosis not present

## 2022-02-26 DIAGNOSIS — M79604 Pain in right leg: Secondary | ICD-10-CM | POA: Diagnosis not present

## 2022-02-26 DIAGNOSIS — R2681 Unsteadiness on feet: Secondary | ICD-10-CM | POA: Diagnosis not present

## 2022-02-26 DIAGNOSIS — M2569 Stiffness of other specified joint, not elsewhere classified: Secondary | ICD-10-CM | POA: Diagnosis not present

## 2022-02-26 DIAGNOSIS — M4316 Spondylolisthesis, lumbar region: Secondary | ICD-10-CM | POA: Diagnosis not present

## 2022-03-04 DIAGNOSIS — R293 Abnormal posture: Secondary | ICD-10-CM | POA: Diagnosis not present

## 2022-03-04 DIAGNOSIS — M2569 Stiffness of other specified joint, not elsewhere classified: Secondary | ICD-10-CM | POA: Diagnosis not present

## 2022-03-04 DIAGNOSIS — M79604 Pain in right leg: Secondary | ICD-10-CM | POA: Diagnosis not present

## 2022-03-04 DIAGNOSIS — M4316 Spondylolisthesis, lumbar region: Secondary | ICD-10-CM | POA: Diagnosis not present

## 2022-03-04 DIAGNOSIS — R2681 Unsteadiness on feet: Secondary | ICD-10-CM | POA: Diagnosis not present

## 2022-03-06 DIAGNOSIS — M2569 Stiffness of other specified joint, not elsewhere classified: Secondary | ICD-10-CM | POA: Diagnosis not present

## 2022-03-06 DIAGNOSIS — R2681 Unsteadiness on feet: Secondary | ICD-10-CM | POA: Diagnosis not present

## 2022-03-06 DIAGNOSIS — R293 Abnormal posture: Secondary | ICD-10-CM | POA: Diagnosis not present

## 2022-03-06 DIAGNOSIS — M79604 Pain in right leg: Secondary | ICD-10-CM | POA: Diagnosis not present

## 2022-03-06 DIAGNOSIS — M4316 Spondylolisthesis, lumbar region: Secondary | ICD-10-CM | POA: Diagnosis not present

## 2022-03-14 DIAGNOSIS — R293 Abnormal posture: Secondary | ICD-10-CM | POA: Diagnosis not present

## 2022-03-14 DIAGNOSIS — R2681 Unsteadiness on feet: Secondary | ICD-10-CM | POA: Diagnosis not present

## 2022-03-14 DIAGNOSIS — M79604 Pain in right leg: Secondary | ICD-10-CM | POA: Diagnosis not present

## 2022-03-14 DIAGNOSIS — M545 Low back pain, unspecified: Secondary | ICD-10-CM | POA: Diagnosis not present

## 2022-03-14 DIAGNOSIS — M2569 Stiffness of other specified joint, not elsewhere classified: Secondary | ICD-10-CM | POA: Diagnosis not present

## 2022-03-19 ENCOUNTER — Encounter: Payer: Self-pay | Admitting: Psychology

## 2022-03-19 DIAGNOSIS — M79604 Pain in right leg: Secondary | ICD-10-CM | POA: Diagnosis not present

## 2022-03-19 DIAGNOSIS — R2681 Unsteadiness on feet: Secondary | ICD-10-CM | POA: Diagnosis not present

## 2022-03-19 DIAGNOSIS — M2569 Stiffness of other specified joint, not elsewhere classified: Secondary | ICD-10-CM | POA: Diagnosis not present

## 2022-03-19 DIAGNOSIS — R293 Abnormal posture: Secondary | ICD-10-CM | POA: Diagnosis not present

## 2022-03-19 DIAGNOSIS — M545 Low back pain, unspecified: Secondary | ICD-10-CM | POA: Diagnosis not present

## 2022-03-20 ENCOUNTER — Ambulatory Visit (INDEPENDENT_AMBULATORY_CARE_PROVIDER_SITE_OTHER): Payer: Medicare Other | Admitting: Psychology

## 2022-03-20 ENCOUNTER — Encounter: Payer: Self-pay | Admitting: Psychology

## 2022-03-20 ENCOUNTER — Ambulatory Visit: Payer: Medicare Other | Admitting: Psychology

## 2022-03-20 DIAGNOSIS — G459 Transient cerebral ischemic attack, unspecified: Secondary | ICD-10-CM | POA: Diagnosis not present

## 2022-03-20 DIAGNOSIS — F0670 Mild neurocognitive disorder due to known physiological condition without behavioral disturbance: Secondary | ICD-10-CM

## 2022-03-20 DIAGNOSIS — G3184 Mild cognitive impairment, so stated: Secondary | ICD-10-CM | POA: Diagnosis not present

## 2022-03-20 DIAGNOSIS — R4189 Other symptoms and signs involving cognitive functions and awareness: Secondary | ICD-10-CM

## 2022-03-20 HISTORY — DX: Mild neurocognitive disorder due to known physiological condition without behavioral disturbance: F06.70

## 2022-03-20 HISTORY — DX: Mild cognitive impairment of uncertain or unknown etiology: G31.84

## 2022-03-20 NOTE — Progress Notes (Signed)
   Psychometrician Note   Cognitive testing was administered to Karen Shannon by Milana Kidney, B.S. (psychometrist) under the supervision of Dr. Christia Reading, Ph.D., licensed psychologist on 03/20/2022. Ms. Rocha did not appear overtly distressed by the testing session per behavioral observation or responses across self-report questionnaires. Rest breaks were offered.    The battery of tests administered was selected by Dr. Christia Reading, Ph.D. with consideration to Ms. Lillo current level of functioning, the nature of her symptoms, emotional and behavioral responses during interview, level of literacy, observed level of motivation/effort, and the nature of the referral question. This battery was communicated to the psychometrist. Communication between Dr. Christia Reading, Ph.D. and the psychometrist was ongoing throughout the evaluation and Dr. Christia Reading, Ph.D. was immediately accessible at all times. Dr. Christia Reading, Ph.D. provided supervision to the psychometrist on the date of this service to the extent necessary to assure the quality of all services provided.    Karen Shannon will return within approximately 1-2 weeks for an interactive feedback session with Dr. Melvyn Novas at which time her test performances, clinical impressions, and treatment recommendations will be reviewed in detail. Ms. Pallo understands she can contact our office should she require our assistance before this time.  A total of 115 minutes of billable time were spent face-to-face with Ms. Guimont by the psychometrist. This includes both test administration and scoring time. Billing for these services is reflected in the clinical report generated by Dr. Christia Reading, Ph.D.  This note reflects time spent with the psychometrician and does not include test scores or any clinical interpretations made by Dr. Melvyn Novas. The full report will follow in a separate note.

## 2022-03-20 NOTE — Progress Notes (Signed)
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 during testing. Ms. Keillor expressed her intention to perform to the best of her abilities and exhibited adequate task engagement and persistence. Scores across stand-alone and embedded performance validity measures were within expectation. As such, the results of the current evaluation are believed to be a valid representation of Ms. Wilmes current cognitive functioning.  Test Results: Ms. Mall was fully oriented at the time of the current evaluation.  Intellectual abilities based upon educational and vocational attainment were estimated to be in the average range. Premorbid abilities were estimated to be within the average range based upon a single-word reading test.   Processing speed was average to well above average. Basic attention was average. More complex attention (e.g., working memory) was also average. Executive functioning was average to above average.  Assessed receptive language abilities were average. Likewise, Ms. Cham did not exhibit any difficulties comprehending task  instructions and answered all questions asked of her appropriately. Assessed expressive language was variable. Phonemic fluency was average, semantic fluency was well below average, and confrontation naming was above average.      Assessed visuospatial/visuoconstructional abilities were average to well above average.    Learning (i.e., encoding) of novel verbal and visual information was variable, ranging from the well below average to average normative ranges. Spontaneous delayed recall (i.e., retrieval) of previously learned information was also variable, ranging from well below average to average normative ranges. Retention rates were 60% across a story learning task, 50% across a list learning task, and 100% across a shape learning task. Performance across recognition tasks was appropriate, suggesting evidence for information consolidation.   Results of emotional screening instruments suggested that recent symptoms of generalized anxiety were in the minimal range, while symptoms of depression were within normal limits. A screening instrument assessing recent sleep quality suggested the presence of minimal sleep dysfunction.  Tables of Scores:   Note: This summary of test scores accompanies the interpretive report and should not be considered in isolation without reference to the appropriate sections in the text. Descriptors are based on appropriate normative data and may be adjusted based on clinical judgment. Terms such as "Within Normal Limits" and "Outside Normal Limits" are used when a more specific description of the test score cannot be determined. Descriptors refer to the current evaluation only.         Percentile - Normative Descriptor > 98 - Exceptionally High 91-97 - Well Above Average 75-90 - Above Average 25-74 - Average 9-24 - Below Average 2-8 - Well Below Average < 2 - Exceptionally Low         December 2020 Current    Orientation:       Raw Score Raw Score Percentile    NAB Orientation, Form 1 29/29  29/29 --- ---        Cognitive Screening:       Raw Score Raw Score Percentile   SLUMS: --- 25/30 --- ---        Intellectual Functioning:       Standard Score Standard Score Percentile   Test of Premorbid Functioning: 101 102 55 Average        Memory:      Wechsler Memory Scale (WMS-IV):                       Raw Score (Scaled Score) Raw Score (Scaled Score) Percentile     Logical Memory I 27/53 (8) 27/53 (8) 25 Average    Logical Memory II 12/39 (7) 12/39 (8) 25 Average      Logical Memory Recognition 20/23 21/23 >75 Above Average        [place] Verbal Learning Test (CVLT-III) Brief Form: Raw Score (Scaled/Standard Score) Raw Score (Scaled/Standard Score) Percentile     Total Trials 1-4 22/36 (83) 22/36 (86) 18 Below Average    Short-Delay Free Recall 7/9 (9) 6/9 (8) 25 Average    Long-Delay Free Recall 7/9 (11) 4/9 (6) 9 Below Average    Long-Delay Cued Recall 7/9 (10) 4/9 (4) 2 Well Below Average      Recognition Hits 8/9 (9) 8/9 (10) 50 Average      False Positive Errors 0 (12) 1 (9) 37 Average        Brief Visuospatial Memory Test (BVMT-R), Form 1: Raw Score (T Score) Raw Score (T Score) Percentile     Total Trials 1-3 16/36 (40) 10/36 (30) 2 Well Below Average    Delayed Recall 7/12 (45) 6/12 (41) 18 Below Average    Recognition Discrimination Index 5 6 --- Within Normal Limits      Recognition Hits 5/6 6/6 --- Within Normal Limits      False Positive Errors 0 0 --- Within Normal Limits         Attention/Executive Function:      Trail Making Test (TMT): Raw Score (T Score) Raw Score (T Score) Percentile     Part A 31 secs.,  0 errors (50) 31 secs.,  0 errors (52) 58 Average    Part B 68 secs.,  0 errors (51) 72 secs.,  0 errors (54) 66 Average          Scaled Score Scaled Score Percentile   WAIS-IV Coding: 10 13 84 Above Average        NAB Attention Module, Form 1: T Score T Score Percentile     Digits Forward 50 45 31  Average    Digits Backwards 45 50 50 Average        D-KEFS Color-Word Interference Test: Raw Score (Scaled Score) Raw Score (Scaled Score) Percentile     Color Naming 29 secs. (12) 24 secs. (14) 91 Well Above Average    Word Reading 21 secs. (12) 20 secs. (13) 84 Above Average    Inhibition 64 secs. (11) 59 secs. (12) 75 Above Average      Total Errors 5 errors (6) 3 errors (10) 50 Average    Inhibition/Switching 70 secs. (11) 65 secs. (12) 75 Above Average      Total Errors 2 errors (11) 0 errors (13) 84 Above Average        D-KEFS 20 Questions Test: Scaled Score Scaled Score Percentile     Total Weighted Achievement Score 13 9 37 Average  Initial Abstraction Score 10 8 25 $ Average        Language:      Verbal Fluency Test: Raw Score (T Score) Raw Score (T Score) Percentile     Phonemic Fluency (FAS) 23 (32) 37 (43) 25 Average    Animal Fluency 19 (48) 13 (33) 5 Well Below Average         NAB Language Module, Form 1: T Score T Score Percentile     Auditory Comprehension 56 56 73 Average    Naming 31/31 (56) 31/31 (58) 79 Above Average        Visuospatial/Visuoconstruction:       Raw Score Raw Score Percentile   Clock Drawing: 8/10 10/10 --- Within Normal Limits        NAB Spatial Module, Form 1: T Score T Score Percentile     Figure Drawing Copy 60 65 93 Well Above Average         Scaled Score Scaled Score Percentile   WAIS-IV Matrix Reasoning: 8 8 25 $ Average        Mood and Personality:       Raw Score Raw Score Percentile   Geriatric Depression Scale: 12 7 --- Within Normal Limits  Geriatric Anxiety Scale: 16 9 --- Minimal    Somatic 10 5 --- Minimal    Cognitive 5 3 --- Mild    Affective 1 1 --- Minimal        Additional Questionnaires:       Raw Score Raw Score Percentile   PROMIS Sleep Disturbance Questionnaire: 13 8 --- None to Slight   Informed Consent and Coding/Compliance:   The current evaluation represents a clinical evaluation for the purposes  previously outlined by the referral source and is in no way reflective of a forensic evaluation.   Ms. Hoye was provided with a verbal description of the nature and purpose of the present neuropsychological evaluation. Also reviewed were the foreseeable risks and/or discomforts and benefits of the procedure, limits of confidentiality, and mandatory reporting requirements of this provider. The patient was given the opportunity to ask questions and receive answers about the evaluation. Oral consent to participate was provided by the patient.   This evaluation was conducted by Christia Reading, Ph.D., ABPP-CN, board certified clinical neuropsychologist. Ms. Guillemette completed a clinical interview with Dr. Melvyn Novas, billed as one unit [phone], and 115 minutes of cognitive testing and scoring, billed as one unit [phone] and three additional units [id number]. Psychometrist Milana Kidney, B.S., assisted Dr. Melvyn Novas with test administration and scoring procedures. As a separate and discrete service, Dr. Melvyn Novas spent a total of 160 minutes in interpretation and report writing billed as one unit [phone] and two units [id number].

## 2022-03-21 DIAGNOSIS — M545 Low back pain, unspecified: Secondary | ICD-10-CM | POA: Diagnosis not present

## 2022-03-21 DIAGNOSIS — M2569 Stiffness of other specified joint, not elsewhere classified: Secondary | ICD-10-CM | POA: Diagnosis not present

## 2022-03-21 DIAGNOSIS — M79604 Pain in right leg: Secondary | ICD-10-CM | POA: Diagnosis not present

## 2022-03-21 DIAGNOSIS — R2681 Unsteadiness on feet: Secondary | ICD-10-CM | POA: Diagnosis not present

## 2022-03-21 DIAGNOSIS — R293 Abnormal posture: Secondary | ICD-10-CM | POA: Diagnosis not present

## 2022-03-24 DIAGNOSIS — L309 Dermatitis, unspecified: Secondary | ICD-10-CM | POA: Diagnosis not present

## 2022-03-25 DIAGNOSIS — R293 Abnormal posture: Secondary | ICD-10-CM | POA: Diagnosis not present

## 2022-03-25 DIAGNOSIS — M545 Low back pain, unspecified: Secondary | ICD-10-CM | POA: Diagnosis not present

## 2022-03-25 DIAGNOSIS — M2569 Stiffness of other specified joint, not elsewhere classified: Secondary | ICD-10-CM | POA: Diagnosis not present

## 2022-03-25 DIAGNOSIS — M79604 Pain in right leg: Secondary | ICD-10-CM | POA: Diagnosis not present

## 2022-03-25 DIAGNOSIS — R2681 Unsteadiness on feet: Secondary | ICD-10-CM | POA: Diagnosis not present

## 2022-03-26 DIAGNOSIS — R2681 Unsteadiness on feet: Secondary | ICD-10-CM | POA: Diagnosis not present

## 2022-03-26 DIAGNOSIS — M545 Low back pain, unspecified: Secondary | ICD-10-CM | POA: Diagnosis not present

## 2022-03-26 DIAGNOSIS — R293 Abnormal posture: Secondary | ICD-10-CM | POA: Diagnosis not present

## 2022-03-26 DIAGNOSIS — M2569 Stiffness of other specified joint, not elsewhere classified: Secondary | ICD-10-CM | POA: Diagnosis not present

## 2022-03-26 DIAGNOSIS — M79604 Pain in right leg: Secondary | ICD-10-CM | POA: Diagnosis not present

## 2022-03-27 ENCOUNTER — Ambulatory Visit (INDEPENDENT_AMBULATORY_CARE_PROVIDER_SITE_OTHER): Payer: Medicare Other | Admitting: Psychology

## 2022-03-27 DIAGNOSIS — G459 Transient cerebral ischemic attack, unspecified: Secondary | ICD-10-CM | POA: Diagnosis not present

## 2022-03-27 DIAGNOSIS — G3184 Mild cognitive impairment, so stated: Secondary | ICD-10-CM

## 2022-03-27 NOTE — Progress Notes (Signed)
   Neuropsychology Feedback Session Karen Shannon. [place] [facility] Neu[facility]  Reason for Referral:   Karen Shannon is a 72 y.o. right-handed Caucasian female referred by Karen Shannon, D.O., to characterize her current cognitive functioning and assist with diagnostic clarity and treatment planning in the context of subjective cognitive decline.   Feedback:   Karen Shannon completed a comprehensive neuropsychological evaluation on 03/20/2022. Please refer to that encounter for the full report and recommendations. Briefly, results suggested an isolated weakness across a semantic (animal) fluency task. Performance variability was also exhibited across encoding (i.e., learning) and delayed retrieval aspects of memory. The underlying etiology is unclear. While Karen Shannon reported experiencing four strokes over the past several years, these likely represent transient ischemic attacks (TIAs) rather than actual strokes. Recent neuroimaging did reveal "minimal" microvascular disease in the right basal ganglia; however, this report does not explicitly describe this event as a prior infarct. Karen Shannon does have numerous medical ailments which would impact cardiovascular and cerebrovascular functioning and variability across encoding/retrieval aspects of memory is a very common finding in vascular/medical presentations. This should remain on her differential. This nonspecific testing pattern can also be seen in individuals with frequent headaches and chronic pain. Per medical records, her PCP previously diagnosed her with dementia due to Alzheimer's disease. I am unaware how this diagnostic conclusion was reached. Karen Shannon absolutely does not meet diagnostic criteria for dementia at the present time. Regarding Alzheimer's disease, despite some variability across current memory tasks, recognition aspects were consistently appropriate. This would not suggest a current storage impairment,  which is the hallmark finding of this illness.  Karen Shannon was unaccompanied during the current telephone call. She was within her residence while I was within my office. I discussed the limitations of evaluation and management by telemedicine and the availability of in person appointments. Karen Shannon expressed her understanding and agreed to proceed. Content of the current session focused on the results of her neuropsychological evaluation. Karen Shannon was given the opportunity to ask questions and her questions were answered. She was encouraged to reach out should additional questions arise. A copy of her report was mailed at the conclusion of the visit.      20 minutes were spent preparing for, conducting, and documenting the current feedback session with Karen Shannon, billed as one unit [phone].

## 2022-04-02 DIAGNOSIS — M2569 Stiffness of other specified joint, not elsewhere classified: Secondary | ICD-10-CM | POA: Diagnosis not present

## 2022-04-02 DIAGNOSIS — R5383 Other fatigue: Secondary | ICD-10-CM | POA: Diagnosis not present

## 2022-04-02 DIAGNOSIS — J4540 Moderate persistent asthma, uncomplicated: Secondary | ICD-10-CM | POA: Diagnosis not present

## 2022-04-02 DIAGNOSIS — R293 Abnormal posture: Secondary | ICD-10-CM | POA: Diagnosis not present

## 2022-04-02 DIAGNOSIS — G4733 Obstructive sleep apnea (adult) (pediatric): Secondary | ICD-10-CM | POA: Diagnosis not present

## 2022-04-02 DIAGNOSIS — M79604 Pain in right leg: Secondary | ICD-10-CM | POA: Diagnosis not present

## 2022-04-02 DIAGNOSIS — R2681 Unsteadiness on feet: Secondary | ICD-10-CM | POA: Diagnosis not present

## 2022-04-02 DIAGNOSIS — M545 Low back pain, unspecified: Secondary | ICD-10-CM | POA: Diagnosis not present

## 2022-04-04 DIAGNOSIS — J310 Chronic rhinitis: Secondary | ICD-10-CM | POA: Diagnosis not present

## 2022-04-04 DIAGNOSIS — G4733 Obstructive sleep apnea (adult) (pediatric): Secondary | ICD-10-CM | POA: Diagnosis not present

## 2022-04-04 DIAGNOSIS — J342 Deviated nasal septum: Secondary | ICD-10-CM | POA: Diagnosis not present

## 2022-04-07 DIAGNOSIS — M4316 Spondylolisthesis, lumbar region: Secondary | ICD-10-CM | POA: Diagnosis not present

## 2022-04-07 DIAGNOSIS — M5416 Radiculopathy, lumbar region: Secondary | ICD-10-CM | POA: Diagnosis not present

## 2022-04-08 NOTE — Progress Notes (Unsigned)
Virtual Visit via Video Note  Consent was obtained for video visit:  Yes.   Answered questions that patient had about telehealth interaction:  Yes.   I discussed the limitations, risks, security and privacy concerns of performing an evaluation and management service by telemedicine. I also discussed with the patient that there may be a patient responsible charge related to this service. The patient expressed understanding and agreed to proceed.  Pt location: Home Physician Location: office Name of referring provider:  Nicoletta Dress, MD I connected with Karen Shannon at patients initiation/request on 04/09/2022 at  9:50 AM EST by video enabled telemedicine application and verified that I am speaking with the correct person using two identifiers. Pt MRN:  [id number] Pt DOB:  Apr 27, 1950 Video Participants:  Karen Shannon  Assessment/Plan:    Mild neurocognitive disorder, unclear etiology but does not meet criteria for dementia.  While pattern is suggestive of secondary causes not neurodegenerative, cannot rule out possibility of developing Alzheimers.   1  as she does not meet criteria for dementia and her profile of cognitive impairment is currently not concerning for precursor for Alzheimer's, I would discontinue donepezil and memantine at this time. 2  Recommend repeat neuropsychological evaluation in 18-24 months. 3  ***   Subjective:  Karen Shannon is a 72 year old female who follows up for migraines.   UPDATE: Current medications:  ASA '325mg'$  daily, atorvastatin '40mg'$ , Lasix  Around May 2022, she fell and hit her head.  She complained of worsening memory problems.  She started having memory problems again.  One time, she was having trouble driving to a doctor's appointment.  She couldn't remember the exit.  She usually uses a GPS but didn't that day because she goes to that office frequently.  MRI of brain on 07/19/2020 showed incidental chronic lacunar infarct  within the right basal ganglia new since prior MRI from 10/24/2018 but no acute or subacute findings.  Carotid ultrasound on 08/08/2020 showed bilateral heterogenous and calcified plaque within the ICAs, no hemodynamically significant stenosis on the right but 50-69% on the left.  She was subsequently diagnosed with Alzheimer's disease by her PCP and started on donepezil and memantine.  She thinks it has helped.  Has OSA and recently got a new CPAP.  She underwent repeat neuropsychological evaluation by Dr. Ilda Mori on 03/20/2022 which revealed isolated weakness across a semantic fluency task and performance variability across encoding and delayed retrieval aspects of memory meeting criteria for mild neurocognitive disorder with slight decline compared to prior testing from December 2020 but does not meet criteria for dementia.  Etiology unclear and can be attributed to cerebrovascular or other medical co-morbidities, however precursor for Alzheimer's cannot be ruled out.     HISTORY: She was diagnosed with "confusional migraines" in the 1970s, where she has severe migraine headache associated with brief confusion lasting 30 minutes.  She typically has a couple a year.  Initially, she was on nortriptyline for a year but stopped due to side effects.   On the night of 10/19/2018, she woke up with stinging paresthesias involving the left side of her face from forehead, over her left eye and down to the left side of her chin.  She also noted moderate left frontotemporal throbbing pain as well as left otalgia, decreased hearing in left ear, blurred vision in left eye, nausea and noted that she was drooling from the corner of both sides of her mouth.  She got up and felt dizzy  and unsteady on her feet.  No slurred speech, speech disturbance, or new unilateral facial droop or extremity weakness.  No fevers.  She was tested for COVID, which was negative.  No specific triggers.  She had an MRI of the brain without contrast on  10/29/2018 which was unremarkable.  Her PCP thought she may have had a migraine, so she was prescribed topiramate.  She stopped after one dose because it didn't make her feel well.  Due to the left otalgia and trouble hearing, she saw ENT.  Exam was negative.  She had mild residual symptoms everyday up until for the next 2 weeks.  During that time, she did have one of her typical migraines.  She noted confusion off and on over those 3 weeks as well.  Typical migraines not associated with left facial paresthesias, drooling and dizziness. Carotid doppler from 11/24/2018 showed only minimal heterogenous plaque with no hemodynamically significant stenosis. In November 2020, she was driving to the grocery store and then she started having a "fuzzy feeling" on her forehead and she suddenly became confused and was disoriented.  She didn't know where she was.  She had to ask her passenger where to go.  It lasted about 5 minutes.  Since then, she has had residual symptoms.  She reported intermittent slurred speech.  She has short term memory problems.  She states this is different than her prior migraines with confusion.  She has left sided neck pain and ear and her orthopedist started her on Mobic which was ineffective.  Left eye is still blurred.  Sed rate was 24.  MRI of brain w wo and MRA of head were normal.  Echocardiogram from 01/21/2019 showed EF 55-60% with no cardiac source of embolus.  Two week holter monitor from 12/12 to 02/05/2019 showed no a fib.  She was subsequently found to have a DVT so ASA was switched to Eliquis in the meantime.  She saw Dr. Marshall Cork at [facility] on 01/10/2019 was unremarkable.  Bilateral ptosis was noted.  She was referred for surgery but is on hold while on anticoagulation.  Neuropsychological testing performed by Dr. Hazle Coca on 01/24/2019 was normal and her subjective memory complaints was felt to be secondary to depression and anxiety.    She does  have chronic neck pain with cervical spondylosis, which is treated by orthopedics.  MRI of cervical spine from 03/27/2018 showed degenerative cervical spondylosis with edematous arthritis at C1-2 on right and C3-4 on the left but no significant foraminal or spinal stenosis.  She says it has gradually gotten worse over the past few months.  MRI of lumbar spine from same day showed scoliosis as well as multilevel degenerative changes including foraminal narrowing on left that may be affecting left L3 nerve root and right foraminal narrowing at L4-5 and L5-S1, possibly affecting right L5 nerve.   She has history of mitral valve prolapse with ventricular tachycardia which is monitored by cardiology.  Workup including TTE and stress test were negative.     Past migraine preventatives:  Nortriptyline, topiramate  Past Medical History: Past Medical History:  Diagnosis Date   Acute deep vein thrombosis (DVT) of left peroneal vein 04/11/2019   Aortic stenosis 04/12/2021   Basal cell carcinoma 07/25/2021   Cervical spondylosis 03/18/2018   Chronic bilateral low back pain without sciatica 03/18/2018   Chronic foot pain, left 03/14/2019   Possible fracture base 2nd metatarsal   Chronic neck pain 03/18/2018   COPD (  chronic obstructive pulmonary disease)    DDD (degenerative disc disease), cervical 03/18/2018   Displaced fracture of fifth metatarsal bone, right foot 03/2020   Dyspnea on exertion 01/27/2018   Facet arthritis, degenerative, lumbar spine 03/18/2018   Family history of malignant neoplasm of breast    Generalized anxiety disorder 01/24/2019   Possible history of panic attacks   Genetic testing 11/14/2020   Negative genetic testing on the BreastNext panel.  PALB2 p.L100F VUS.  The BreastNext gene panel offered by [facility] includes sequencing and rearrangement analysis for the following 17 genes:  ATM, BARD1, BRCA1, BRCA2, BRIP1, CDH1, CHEK2, MRE11A, MUTYH, NBN, NF1, PALB2, PTEN, RAD50, RAD51C,  RAD51D, and TP53.   The report date was 11/18/2013.     UPDATE: PALB2 p.L100F has been reclassified to Likely B   Greater trochanteric bursitis of both hips 04/10/2018   Heart murmur 01/27/2018   Left leg swelling 02/23/2019   Lumbar spondylosis 03/18/2018   Malignant neoplasm of central portion of left female breast    S/p bilateral mastectomy   Mild cognitive impairment of uncertain or unknown etiology 03/20/2022   Mitral valve prolapse 01/27/2018   Neuropathy 06/17/2021   Obstructive sleep apnea    Variable CPAP use.   Osteopenia after menopause 12/21/2020   Other idiopathic scoliosis, lumbar region 03/18/2018   Pain of left calf 03/14/2019   Palpitations 01/25/2018   Peripheral vascular disease, unspecified 10/29/2020   up to 69% stenosis on the lef internal carotic artery based on cardiac ultrasounds from June 2022   Pes planus of left foot 02/23/2019   Pinched nerve 02/10/2021   Sprain of tarsometatarsal ligament of right foot 07/14/2019   Stasis dermatitis, acute 10/03/2019   Tenosynovitis of left ankle 04/11/2019   TIA (transient ischemic attack) 01/17/2019   Urge incontinence 07/28/2019   Varicose veins of left lower extremity    Ventricular tachycardia 02/28/2019    Medications: Outpatient Encounter Medications as of 04/09/2022  Medication Sig   ascorbic acid (VITAMIN C) 500 MG tablet Take 500 mg by mouth daily.   aspirin 81 MG chewable tablet Chew 81 mg by mouth daily.   atorvastatin (LIPITOR) 80 MG tablet Take 1 tablet by mouth once daily   Cholecalciferol 25 MCG (1000 UT) capsule Take 1,000 Units by mouth daily.   dicyclomine (BENTYL) 10 MG capsule Take 10 mg by mouth as needed for spasms (IBS).   diphenoxylate-atropine (LOMOTIL) 2.5-0.025 MG tablet Take 1 tablet by mouth as needed for diarrhea or loose stools.   donepezil (ARICEPT) 10 MG tablet Take 10 mg by mouth at bedtime.   dorzolamide (TRUSOPT) 2 % ophthalmic solution Place 1 drop into the right eye 2 (two)  times daily.   ergocalciferol (VITAMIN D2) 50000 units capsule Take 50,000 Units by mouth once a week.   furosemide (LASIX) 20 MG tablet Take 20 mg by mouth daily.   HYDROcodone-acetaminophen (NORCO) 7.5-325 MG tablet Take 1 tablet by mouth every 6 (six) hours as needed for moderate pain. 0.5 tablet as needed for bursitis and [place]   memantine (NAMENDA) 5 MG tablet Take 5 mg by mouth 2 (two) times daily.   metoprolol succinate (TOPROL-XL) 25 MG 24 hr tablet TAKE 1 TABLET BY MOUTH EVERY DAY (Patient taking differently: Take 25 mg by mouth daily.)   montelukast (SINGULAIR) 10 MG tablet Take 10 mg by mouth at bedtime.   ondansetron (ZOFRAN) 4 MG tablet Take 4 mg by mouth every 8 (eight) hours as needed for nausea or vomiting.  pregabalin (LYRICA) 50 MG capsule Take 50 mg by mouth at bedtime.   solifenacin (VESICARE) 10 MG tablet Take 10 mg by mouth daily.   tamsulosin (FLOMAX) 0.4 MG CAPS capsule Take 0.4 mg by mouth daily after supper.   tiZANidine (ZANAFLEX) 4 MG tablet Take 4 mg by mouth every 8 (eight) hours as needed for muscle spasms.   vitamin B-12 (CYANOCOBALAMIN) 1000 MCG tablet Take 1,000 mcg by mouth daily.   No facility-administered encounter medications on file as of 04/09/2022.    Allergies: Allergies  Allergen Reactions   Clarithromycin Diarrhea and Other (See Comments)   Prednisone Other (See Comments)    Passed out Other reaction(s): other   Sertraline Diarrhea   Sertraline Hcl Diarrhea    Family History: Family History  Problem Relation Age of Onset   Lymphoma Mother 96       deceased 65   Breast cancer Maternal Aunt        Dx 63s; Deceased 14s   Lung cancer Maternal Uncle    Stomach cancer Paternal Uncle        deceased 46   Liver cancer Maternal Uncle    Prostate cancer Other        3 mat cousins   Breast cancer Other 43       mother's mat half-sister   Emphysema Father     Observations/Objective:   *** No acute distress.  Alert and oriented.  Speech  fluent and not dysarthric.  Language intact.  Eyes orthophoric on primary gaze.  Face symmetric.   Follow Up Instructions:    -I discussed the assessment and treatment plan with the patient. The patient was provided an opportunity to ask questions and all were answered. The patient agreed with the plan and demonstrated an understanding of the instructions.   The patient was advised to call back or seek an in-person evaluation if the symptoms worsen or if the condition fails to improve as anticipated.   Dudley Major, DO

## 2022-04-09 ENCOUNTER — Telehealth (INDEPENDENT_AMBULATORY_CARE_PROVIDER_SITE_OTHER): Payer: Medicare Other | Admitting: Neurology

## 2022-04-09 DIAGNOSIS — G3184 Mild cognitive impairment, so stated: Secondary | ICD-10-CM | POA: Diagnosis not present

## 2022-04-10 ENCOUNTER — Encounter: Payer: Self-pay | Admitting: Neurology

## 2022-04-11 ENCOUNTER — Ambulatory Visit (INDEPENDENT_AMBULATORY_CARE_PROVIDER_SITE_OTHER): Payer: Medicare Other | Admitting: Neurology

## 2022-04-11 ENCOUNTER — Telehealth: Payer: Self-pay

## 2022-04-11 ENCOUNTER — Other Ambulatory Visit: Payer: Self-pay | Admitting: *Deleted

## 2022-04-11 DIAGNOSIS — M5417 Radiculopathy, lumbosacral region: Secondary | ICD-10-CM

## 2022-04-11 DIAGNOSIS — M79604 Pain in right leg: Secondary | ICD-10-CM

## 2022-04-11 DIAGNOSIS — M79605 Pain in left leg: Secondary | ICD-10-CM

## 2022-04-11 DIAGNOSIS — I6522 Occlusion and stenosis of left carotid artery: Secondary | ICD-10-CM

## 2022-04-11 NOTE — Telephone Encounter (Signed)
Called patient and gave message below, patient verbalized understanding.

## 2022-04-11 NOTE — Telephone Encounter (Signed)
Patient is calling in stating she has a EMG scheduled, wondering if she needs to not take any of her pain medications. Worried it will affect the test results.

## 2022-04-11 NOTE — Procedures (Signed)
[facility]  [place], [place]  [place], [place] [address] Tel: [phone] Fax: [phone] Test Date:  04/11/2022  Patient: Karen Shannon DOB: 11-19-1950 Physician: Narda Amber, DO  Sex: Female Height: '5\' 5"'$  Ref Phys: Metta Clines, DO  ID#: [id number]   Technician:    History: This is a 72 year old female referred for evaluation of radicular leg pain.  NCV & EMG Findings: Extensive electrodiagnostic testing of the right lower extremity and additional studies of the left shows:  Bilateral sural and superficial peroneal sensory responses are within normal limits. Bilateral peroneal motor responses are within normal limits.  Bilateral tibial motor responses show reduced amplitude (R2.5, L2.8 ms); of note, there is bony deformity in the medial foot resulting in localized muscle atrophy. Bilateral tibial H reflex studies are within normal limits. Chronic axon loss changes are seen affecting the right L5 myotome, without accompanying active denervation.  These findings are not present in the left lower extremity.  Impression: Chronic L5 radiculopathy affecting the right lower extremity, mild. There is no evidence of a large fiber sensorimotor polyneuropathy affecting either lower extremity.   ___________________________ Narda Amber, DO    Nerve Conduction Studies   Stim Site NR Peak (ms) Norm Peak (ms) O-P Amp (V) Norm O-P Amp  Left Sup Peroneal Anti Sensory (Ant Lat Mall)  32 C  12 cm    2.8 <4.6 5.3 >3  Right Sup Peroneal Anti Sensory (Ant Lat Mall)  32 C  12 cm    2.6 <4.6 4.3 >3  Left Sural Anti Sensory (Lat Mall)  32 C  Calf    2.2 <4.6 7.4 >3  Right Sural Anti Sensory (Lat Mall)  32 C  Calf    2.5 <4.6 4.0 >3     Stim Site NR Onset (ms) Norm Onset (ms) O-P Amp (mV) Norm O-P Amp Site1 Site2 Delta-0 (ms) Dist (cm) Vel (m/s) Norm Vel (m/s)  Left Peroneal Motor (Ext Dig Brev)  32 C  Ankle    3.6 <6.0 2.6 >2.5 B Fib Ankle 6.8 34.0 50 >40  B  Fib    10.4  2.5  Poplt B Fib 1.4 6.0 43 >40  Poplt    11.8  2.5         Right Peroneal Motor (Ext Dig Brev)  32 C  Ankle    2.8 <6.0 2.7 >2.5 B Fib Ankle 7.5 36.0 48 >40  B Fib    10.3  2.4  Poplt B Fib 1.5 8.0 53 >40  Poplt    11.8  2.4         Left Tibial Motor (Abd Hall Brev)  32 C  Ankle    4.1 <6.0 *2.8 >4 Knee Ankle 6.9 39.0 57 >40  Knee    11.0  2.0         Right Tibial Motor (Abd Hall Brev)  32 C  Ankle    4.4 <6.0 *2.5 >4 Knee Ankle 7.1 40.0 56 >40  Knee    11.5  1.7          Electromyography   Side Muscle Ins.Act Fibs Fasc Recrt Amp Dur Poly Activation Comment  Right AntTibialis Nml Nml Nml *1- *1+ *1+ *1+ Nml N/A  Right Gastroc Nml Nml Nml Nml Nml Nml Nml Nml N/A  Right Flex Dig Long Nml Nml Nml *1- *1+ *1+ *1+ Nml N/A  Right GlutMedius Nml Nml Nml *1- *1+ *1+ *1+ Nml N/A  Right RectFemoris Nml Nml Nml  Nml Nml Nml Nml Nml N/A  Left AntTibialis Nml Nml Nml Nml Nml Nml Nml Nml N/A  Left Gastroc Nml Nml Nml Nml Nml Nml Nml Nml N/A  Left Flex Dig Long Nml Nml Nml Nml Nml Nml Nml Nml N/A  Left RectFemoris Nml Nml Nml Nml Nml Nml Nml Nml N/A  Left AdductorLong Nml Nml Nml Nml Nml Nml Nml Nml N/A      Waveforms:

## 2022-04-14 NOTE — Progress Notes (Unsigned)
HISTORY AND PHYSICAL     CC:  follow up. Requesting Provider:  Nicoletta Dress, MD  HPI: This is a 72 y.o. female here for follow up for carotid artery stenosis.  Pt has hx of CVA.    She reportedly had a CT scan which showed a stroke in the right brain and had some left hand tingling related to the stroke. When she was originally seen by Dr. Scot Dock, and she originally had 60-79% on the left and minimal stenosis on the right.    Pt was last seen 02/28/2021 and at that time her left ICA stenosis had improved and she remained asymptomatic.  She was having some right leg and hip pain and it was felt this was related to her back as she had palpable pedal pulses bilaterally.   Pt returns today for follow up.    Pt denies any amaurosis fugax, speech difficulties, weakness, numbness, paralysis or clumsiness or facial droop.   She states she was at church saying a prayer and she developed a grayness over both eyes that lasted about a minute.  This was not unilateral.  She is still having some lower back issues with pain that radiates down the right leg.  She is compliant with her asa/statin.  She states she has dermatitis in both eyes.  She recently saw her eye doctor but did not mention the grayness over both her eyes.  She has hx of confusional migraines.    She has hx of DVT, first in the left leg after a fall and then about a year later, she states she had a DVT in the right leg as well.  It was recommended she be on anticoagulation, however she could not afford the Eliquis and takes a baby asa daily.   The pt is on a statin for cholesterol management.  The pt is on a daily aspirin.   Other AC:  none The pt is on diuretic, BB for hypertension.   The pt does not have diabetes Tobacco hx:  never   Past Medical History:  Diagnosis Date   Acute deep vein thrombosis (DVT) of left peroneal vein 04/11/2019   Aortic stenosis 04/12/2021   Basal cell carcinoma 07/25/2021   Cervical spondylosis  03/18/2018   Chronic bilateral low back pain without sciatica 03/18/2018   Chronic foot pain, left 03/14/2019   Possible fracture base 2nd metatarsal   Chronic neck pain 03/18/2018   COPD (chronic obstructive pulmonary disease)    DDD (degenerative disc disease), cervical 03/18/2018   Displaced fracture of fifth metatarsal bone, right foot 03/2020   Dyspnea on exertion 01/27/2018   Facet arthritis, degenerative, lumbar spine 03/18/2018   Family history of malignant neoplasm of breast    Generalized anxiety disorder 01/24/2019   Possible history of panic attacks   Genetic testing 11/14/2020   Negative genetic testing on the BreastNext panel.  PALB2 p.L100F VUS.  The BreastNext gene panel offered by [facility] includes sequencing and rearrangement analysis for the following 17 genes:  ATM, BARD1, BRCA1, BRCA2, BRIP1, CDH1, CHEK2, MRE11A, MUTYH, NBN, NF1, PALB2, PTEN, RAD50, RAD51C, RAD51D, and TP53.   The report date was 11/18/2013.     UPDATE: PALB2 p.L100F has been reclassified to Likely B   Greater trochanteric bursitis of both hips 04/10/2018   Heart murmur 01/27/2018   Left leg swelling 02/23/2019   Lumbar spondylosis 03/18/2018   Malignant neoplasm of central portion of left female breast    S/p bilateral mastectomy  Mild cognitive impairment of uncertain or unknown etiology 03/20/2022   Mitral valve prolapse 01/27/2018   Neuropathy 06/17/2021   Obstructive sleep apnea    Variable CPAP use.   Osteopenia after menopause 12/21/2020   Other idiopathic scoliosis, lumbar region 03/18/2018   Pain of left calf 03/14/2019   Palpitations 01/25/2018   Peripheral vascular disease, unspecified 10/29/2020   up to 69% stenosis on the lef internal carotic artery based on cardiac ultrasounds from June 2022   Pes planus of left foot 02/23/2019   Pinched nerve 02/10/2021   Sprain of tarsometatarsal ligament of right foot 07/14/2019   Stasis dermatitis, acute 10/03/2019   Tenosynovitis of left ankle  04/11/2019   TIA (transient ischemic attack) 01/17/2019   Urge incontinence 07/28/2019   Varicose veins of left lower extremity    Ventricular tachycardia 02/28/2019    Past Surgical History:  Procedure Laterality Date   basal cell removed     Head   BUNIONECTOMY Right 1999   MASTECTOMY Bilateral 2012   [place]   SKIN CANCER EXCISION  2018   Top of head    SKIN CANCER EXCISION  07/2019   Nose   TENDON TRANSFER Right 1999   foot   TONGUE BIOPSY  12/04/2021   Dr Gaylyn Cheers    Allergies  Allergen Reactions   Clarithromycin Diarrhea and Other (See Comments)   Prednisone Other (See Comments)    Passed out Other reaction(s): other   Sertraline Diarrhea   Sertraline Hcl Diarrhea    Current Outpatient Medications  Medication Sig Dispense Refill   ascorbic acid (VITAMIN C) 500 MG tablet Take 500 mg by mouth daily.     aspirin 81 MG chewable tablet Chew 81 mg by mouth daily.     atorvastatin (LIPITOR) 80 MG tablet Take 1 tablet by mouth once daily 90 tablet 2   Cholecalciferol 25 MCG (1000 UT) capsule Take 1,000 Units by mouth daily.     dicyclomine (BENTYL) 10 MG capsule Take 10 mg by mouth as needed for spasms (IBS).     diphenoxylate-atropine (LOMOTIL) 2.5-0.025 MG tablet Take 1 tablet by mouth as needed for diarrhea or loose stools.     donepezil (ARICEPT) 10 MG tablet Take 10 mg by mouth at bedtime.     dorzolamide (TRUSOPT) 2 % ophthalmic solution Place 1 drop into the right eye 2 (two) times daily.     ergocalciferol (VITAMIN D2) 50000 units capsule Take 50,000 Units by mouth once a week.     furosemide (LASIX) 20 MG tablet Take 20 mg by mouth daily.     HYDROcodone-acetaminophen (NORCO) 7.5-325 MG tablet Take 1 tablet by mouth every 6 (six) hours as needed for moderate pain. 0.5 tablet as needed for bursitis and [place]     memantine (NAMENDA) 5 MG tablet Take 5 mg by mouth 2 (two) times daily.     metoprolol succinate (TOPROL-XL) 25 MG 24 hr tablet TAKE 1 TABLET  BY MOUTH EVERY DAY (Patient taking differently: Take 25 mg by mouth daily.) 90 tablet 2   montelukast (SINGULAIR) 10 MG tablet Take 10 mg by mouth at bedtime.     ondansetron (ZOFRAN) 4 MG tablet Take 4 mg by mouth every 8 (eight) hours as needed for nausea or vomiting.     pregabalin (LYRICA) 50 MG capsule Take 50 mg by mouth at bedtime.     solifenacin (VESICARE) 10 MG tablet Take 10 mg by mouth daily.     tamsulosin (  FLOMAX) 0.4 MG CAPS capsule Take 0.4 mg by mouth daily after supper.     tiZANidine (ZANAFLEX) 4 MG tablet Take 4 mg by mouth every 8 (eight) hours as needed for muscle spasms.     vitamin B-12 (CYANOCOBALAMIN) 1000 MCG tablet Take 1,000 mcg by mouth daily.     No current facility-administered medications for this visit.    Family History  Problem Relation Age of Onset   Lymphoma Mother 29       deceased 54   Breast cancer Maternal Aunt        Dx 52s; Deceased 42s   Lung cancer Maternal Uncle    Stomach cancer Paternal Uncle        deceased 52   Liver cancer Maternal Uncle    Prostate cancer Other        3 mat cousins   Breast cancer Other 6       mother's mat half-sister   Emphysema Father     Social History   Socioeconomic History   Marital status: Single    Spouse name: Not on file   Number of children: 0   Years of education: 16   Highest education level: Bachelor's degree (e.g., BA, AB, BS)  Occupational History   Occupation: Retired  Tobacco Use   Smoking status: Never   Smokeless tobacco: Never  Vaping Use   Vaping Use: Never used  Substance and Sexual Activity   Alcohol use: Not Currently   Drug use: Never   Sexual activity: Not Currently  Other Topics Concern   Not on file  Social History Narrative   Pt is single she lives alone has no children   Right handed   Social Determinants of Health   Financial Resource Strain: Not on file  Food Insecurity: Not on file  Transportation Needs: Not on file  Physical Activity: Not on file   Stress: Not on file  Social Connections: Not on file  Intimate Partner Violence: Not on file     REVIEW OF SYSTEMS:   '[X]'$  denotes positive finding, '[ ]'$  denotes negative finding Cardiac  Comments:  Chest pain or chest pressure:    Shortness of breath upon exertion:    Short of breath when lying flat:    Irregular heart rhythm:        Vascular    Pain in calf, thigh, or hip brought on by ambulation:    Pain in feet at night that wakes you up from your sleep:     Blood clot in your veins:    Leg swelling:         Pulmonary    Oxygen at home:    Productive cough:     Wheezing:         Neurologic    Sudden weakness in arms or legs:     Sudden numbness in arms or legs:     Sudden onset of difficulty speaking or slurred speech:    Temporary loss of vision in one eye:     Problems with dizziness:         Gastrointestinal    Blood in stool:     Vomited blood:         Genitourinary    Burning when urinating:     Blood in urine:        Psychiatric    Major depression:         Hematologic    Bleeding problems:    Problems with  blood clotting too easily:        Skin    Rashes or ulcers:        Constitutional    Fever or chills:      PHYSICAL EXAMINATION:   General:  WDWN in NAD; vital signs documented above Gait: Not observed HENT: WNL, normocephalic Pulmonary: normal non-labored breathing Cardiac: regular HR, without carotid bruits Skin: without rashes Vascular Exam/Pulses: She has palpable radial pulses bilaterally Extremities: without open wounds Musculoskeletal: no muscle wasting or atrophy  Neurologic: A&O X 3; moving all extremities equally; speech is fluent/normal Psychiatric:  The pt has Normal affect.   Non-Invasive Vascular Imaging:   Carotid Duplex on 04/15/2022 Right:  normal without stenosis Left:  40-59% ICA stenosis   Previous Carotid duplex on 02/28/2021: Right: normal Left:   40-59% ICA stenosis    ASSESSMENT/PLAN:: 72 y.o. female  here for follow up carotid artery stenosis and hx of TIA/stroke  -duplex today reveals no evidence of stenosis of the left ICA and the right is stable in the 40-59% ICA stenosis.  She describes visual changes with a "grayness" that comes over both eyes.  This is not related to her carotid artery stenosis given it is bilateral.   -discussed s/s of stroke with pt and she understands should she develop any of these sx, she will go to the nearest ER or call 911. -pt will f/u in one year with carotid duplex -pt will call sooner should she have any issues. -continue statin/asa    Leontine Locket, [facility] [phone]  Clinic MD:  Carlis Abbott

## 2022-04-15 ENCOUNTER — Ambulatory Visit (HOSPITAL_COMMUNITY)
Admission: RE | Admit: 2022-04-15 | Discharge: 2022-04-15 | Disposition: A | Discharge status: Home / Self Care | Payer: Medicare Other | Source: Ambulatory Visit | Attending: Vascular Surgery | Admitting: Vascular Surgery

## 2022-04-15 ENCOUNTER — Encounter: Payer: Self-pay | Admitting: Physician Assistant

## 2022-04-15 ENCOUNTER — Ambulatory Visit (INDEPENDENT_AMBULATORY_CARE_PROVIDER_SITE_OTHER): Payer: Medicare Other | Admitting: Physician Assistant

## 2022-04-15 DIAGNOSIS — I6522 Occlusion and stenosis of left carotid artery: Secondary | ICD-10-CM

## 2022-04-15 DIAGNOSIS — H40013 Open angle with borderline findings, low risk, bilateral: Secondary | ICD-10-CM | POA: Diagnosis not present

## 2022-04-15 DIAGNOSIS — H02403 Unspecified ptosis of bilateral eyelids: Secondary | ICD-10-CM | POA: Diagnosis not present

## 2022-04-15 NOTE — Progress Notes (Signed)
Patient advised of her Results.

## 2022-04-21 ENCOUNTER — Ambulatory Visit: Payer: Medicare Other | Admitting: Cardiology

## 2022-05-12 DIAGNOSIS — M5416 Radiculopathy, lumbar region: Secondary | ICD-10-CM | POA: Diagnosis not present

## 2022-05-12 DIAGNOSIS — M4316 Spondylolisthesis, lumbar region: Secondary | ICD-10-CM | POA: Diagnosis not present

## 2022-05-16 DIAGNOSIS — E041 Nontoxic single thyroid nodule: Secondary | ICD-10-CM | POA: Diagnosis not present

## 2022-05-23 DIAGNOSIS — E041 Nontoxic single thyroid nodule: Secondary | ICD-10-CM | POA: Diagnosis not present

## 2022-05-23 DIAGNOSIS — J342 Deviated nasal septum: Secondary | ICD-10-CM | POA: Diagnosis not present

## 2022-05-26 DIAGNOSIS — G4733 Obstructive sleep apnea (adult) (pediatric): Secondary | ICD-10-CM | POA: Diagnosis not present

## 2022-05-28 ENCOUNTER — Ambulatory Visit: Payer: Medicare Other | Attending: Cardiology | Admitting: Cardiology

## 2022-05-28 ENCOUNTER — Encounter: Payer: Self-pay | Admitting: Cardiology

## 2022-05-28 VITALS — BP 110/70 | HR 70 | Ht 65.5 in | Wt 194.0 lb

## 2022-05-28 DIAGNOSIS — E782 Mixed hyperlipidemia: Secondary | ICD-10-CM | POA: Diagnosis not present

## 2022-05-28 DIAGNOSIS — I472 Ventricular tachycardia, unspecified: Secondary | ICD-10-CM

## 2022-05-28 DIAGNOSIS — I6522 Occlusion and stenosis of left carotid artery: Secondary | ICD-10-CM | POA: Diagnosis not present

## 2022-05-28 DIAGNOSIS — N135 Crossing vessel and stricture of ureter without hydronephrosis: Secondary | ICD-10-CM | POA: Diagnosis not present

## 2022-05-28 DIAGNOSIS — R072 Precordial pain: Secondary | ICD-10-CM

## 2022-05-28 DIAGNOSIS — R002 Palpitations: Secondary | ICD-10-CM | POA: Diagnosis not present

## 2022-05-28 DIAGNOSIS — N3281 Overactive bladder: Secondary | ICD-10-CM | POA: Diagnosis not present

## 2022-05-28 DIAGNOSIS — R351 Nocturia: Secondary | ICD-10-CM | POA: Diagnosis not present

## 2022-05-28 MED ORDER — METOPROLOL TARTRATE 100 MG PO TABS: 100.0000 mg | ORAL_TABLET | Freq: Once | ORAL | 0 refills | Status: DC

## 2022-05-28 NOTE — Patient Instructions (Signed)
Medication Instructions:  Your physician recommends that you continue on your current medications as directed. Please refer to the Current Medication list given to you today.  *If you need a refill on your cardiac medications before your next appointment, please call your pharmacy*   Lab Work: Your physician recommends that you return for lab work in:   Labs today: BMP  If you have labs (blood work) drawn today and your tests are completely normal, you will receive your results only by: MyChart Message (if you have MyChart) OR A paper copy in the mail If you have any lab test that is abnormal or we need to change your treatment, we will call you to review the results.   Testing/Procedures:  Please follow these instructions carefully (unless otherwise directed):  On the Night Before the Test: Be sure to Drink plenty of water. Do not consume any caffeinated/decaffeinated beverages or chocolate 12 hours prior to your test. Do not take any antihistamines 12 hours prior to your test.  On the Day of the Test: Drink plenty of water until 1 hour prior to the test. Do not eat any food 1 hour prior to test. You may take your regular medications prior to the test.  Take metoprolol (Lopressor) two hours prior to test. If you take Furosemide please HOLD on the morning of the test. FEMALES- please wear underwire-free bra if available, avoid dresses & tight clothing      After the Test: Drink plenty of water. After receiving IV contrast, you may experience a mild flushed feeling. This is normal. On occasion, you may experience a mild rash up to 24 hours after the test. This is not dangerous. If this occurs, you can take Benadryl 25 mg and increase your fluid intake. If you experience trouble breathing, this can be serious. If it is severe call 911 IMMEDIATELY. If it is mild, please call our office. If you take any of these medications: Glipizide/Metformin, Avandament, Glucavance, please do  not take 48 hours after completing test unless otherwise instructed.  We will call to schedule your test 2-4 weeks out understanding that some insurance companies will need an authorization prior to the service being performed.   Follow-Up: At [facility], you and your health needs are our priority.  As part of our continuing mission to provide you with exceptional heart care, we have created designated Provider Care Teams.  These Care Teams include your primary Cardiologist (physician) and Advanced Practice Providers (APPs -  Physician Assistants and Nurse Practitioners) who all work together to provide you with the care you need, when you need it.  We recommend signing up for the patient portal called "MyChart".  Sign up information is provided on this After Visit Summary.  MyChart is used to connect with patients for Virtual Visits (Telemedicine).  Patients are able to view lab/test results, encounter notes, upcoming appointments, etc.  Non-urgent messages can be sent to your provider as well.   To learn more about what you can do with MyChart, go to [email or web address].    Your next appointment:   3 month(s)  Provider:   Wallis Bamberg, NP [facility])    Other Instructions None

## 2022-05-28 NOTE — Progress Notes (Signed)
Cardiology Office Note:    Date:  05/28/2022   ID:  Karen Shannon, DOB 05/17/1950, MRN [id number]  PCP:  Paulina Fusi, MD   [facility] Providers Cardiologist:  Gypsy Balsam, MD     Referring MD: Paulina Fusi, MD   CC: follow up palpitations, chest pain  History of Present Illness:    Karen Shannon is a 72 y.o. female with a hx of DVT--could not afford Eliquis and takes aspirin, carotid artery stenosis followed by vascular, mitral valve prolapse, TIA, ventricular tachycardia, PVD, aortic stenosis, OSA on CPAP, COPD, breast cancer 2012, palpitations.  She initially establish care with Dr. Bing Matter on 01/27/2018 at the behest of her PCP for evaluation of palpitations and dizziness.  Previously she had been seen by Dr. Sherlyn Lick, was diagnosed with MVP and experienced palpitations at that time as well.  Acute cardiogram at this time revealed an EF of 55 to 60%, grade 1 DD.  She wore a Holter monitor in January 2020 which revealed normal sinus rhythm, infrequent PVCs and PACs.  Repeat monitor following CVA in 2021 revealed 3 episodes of ventricular tachycardia lasting 12 beats, however she was asymptomatic.  She underwent a stress test for further evaluation of her ventricular tachycardia, study was normal, low risk.   Carotid ultrasound on 04/15/2022 revealed 40 to 50% left ICA stenosis, right normal without stenosis.  Most recently she was evaluated by Dr. Bing Matter on 10/11/21, she was doing well at this time from a cardiac perspective, however she did have some pedal edema that appeared to be new. Most recent echo in September 2023, EF 55 to 60%, grade 2 DD, no evidence of mitral valve regurgitation, mild thickening of the aortic valve.  She presents today with concerns of tightness in her chest that has been occurring for the last month.  She states this most typically occurs when she lays down at night.  She sleeps in an adjustable bed with her head elevated  every night, she states that the sensation she is experiencing is not related to acid reflux.  She also states that the pain can occur when she is vacuuming or doing other housework.  It is hard for her to verbalize exactly what the pain feels like, other than " tight band across my chest".  She has not had any symptoms today. She denies palpitations, dyspnea, pnd, orthopnea, n, v, dizziness, syncope, edema, weight gain, or early satiety.    Past Medical History:  Diagnosis Date   Acute deep vein thrombosis (DVT) of left peroneal vein 04/11/2019   Aortic stenosis 04/12/2021   Basal cell carcinoma 07/25/2021   Cervical spondylosis 03/18/2018   Chronic bilateral low back pain without sciatica 03/18/2018   Chronic foot pain, left 03/14/2019   Possible fracture base 2nd metatarsal   Chronic neck pain 03/18/2018   COPD (chronic obstructive pulmonary disease)    DDD (degenerative disc disease), cervical 03/18/2018   Displaced fracture of fifth metatarsal bone, right foot 03/2020   Dyspnea on exertion 01/27/2018   Facet arthritis, degenerative, lumbar spine 03/18/2018   Family history of malignant neoplasm of breast    Generalized anxiety disorder 01/24/2019   Possible history of panic attacks   Genetic testing 11/14/2020   Negative genetic testing on the BreastNext panel.  PALB2 p.L100F VUS.  The BreastNext gene panel offered by [employer] includes sequencing and rearrangement analysis for the following 17 genes:  ATM, BARD1, BRCA1, BRCA2, BRIP1, CDH1, CHEK2, MRE11A, MUTYH, NBN, NF1, PALB2,  PTEN, RAD50, RAD51C, RAD51D, and TP53.   The report date was 11/18/2013.     UPDATE: PALB2 p.L100F has been reclassified to Likely B   Greater trochanteric bursitis of both hips 04/10/2018   Heart murmur 01/27/2018   Left leg swelling 02/23/2019   Lumbar spondylosis 03/18/2018   Malignant neoplasm of central portion of left female breast    S/p bilateral mastectomy   Mild cognitive impairment of uncertain or  unknown etiology 03/20/2022   Mitral valve prolapse 01/27/2018   Neuropathy 06/17/2021   Obstructive sleep apnea    Variable CPAP use.   Osteopenia after menopause 12/21/2020   Other idiopathic scoliosis, lumbar region 03/18/2018   Pain of left calf 03/14/2019   Palpitations 01/25/2018   Peripheral vascular disease, unspecified 10/29/2020   up to 69% stenosis on the lef internal carotic artery based on cardiac ultrasounds from June 2022   Pes planus of left foot 02/23/2019   Pinched nerve 02/10/2021   Sprain of tarsometatarsal ligament of right foot 07/14/2019   Stasis dermatitis, acute 10/03/2019   Tenosynovitis of left ankle 04/11/2019   TIA (transient ischemic attack) 01/17/2019   Urge incontinence 07/28/2019   Varicose veins of left lower extremity    Ventricular tachycardia 02/28/2019    Past Surgical History:  Procedure Laterality Date   basal cell removed     Head   BUNIONECTOMY Right 1999   MASTECTOMY Bilateral 2012   NASAL SEPTUM SURGERY  1996   SKIN CANCER EXCISION  2018   Top of head    SKIN CANCER EXCISION  07/2019   Nose   TENDON TRANSFER Right 1999   foot   TONGUE BIOPSY  12/04/2021   Dr Marcheta Grammes    Current Medications: Current Meds  Medication Sig   ascorbic acid (VITAMIN C) 500 MG tablet Take 500 mg by mouth daily.   aspirin 81 MG chewable tablet Chew 81 mg by mouth daily.   atorvastatin (LIPITOR) 80 MG tablet Take 1 tablet by mouth once daily   calcium carbonate (SUPER CALCIUM) 1500 (600 Ca) MG TABS tablet Take 1 tablet by mouth daily.   Cholecalciferol 25 MCG (1000 UT) capsule Take 1,000 Units by mouth daily.   dicyclomine (BENTYL) 10 MG capsule Take 10 mg by mouth as needed for spasms (IBS).   diphenoxylate-atropine (LOMOTIL) 2.5-0.025 MG tablet Take 1 tablet by mouth as needed for diarrhea or loose stools.   donepezil (ARICEPT) 10 MG tablet Take 10 mg by mouth at bedtime.   dorzolamide (TRUSOPT) 2 % ophthalmic solution Place 1 drop into the right eye 2  (two) times daily.   ergocalciferol (VITAMIN D2) 50000 units capsule Take 50,000 Units by mouth once a week.   furosemide (LASIX) 20 MG tablet Take 20 mg by mouth daily.   HYDROcodone-acetaminophen (NORCO) 7.5-325 MG tablet Take 1 tablet by mouth 3 (three) times daily.   memantine (NAMENDA) 5 MG tablet Take 5 mg by mouth 2 (two) times daily.   metoprolol succinate (TOPROL-XL) 25 MG 24 hr tablet TAKE 1 TABLET BY MOUTH EVERY DAY   metoprolol tartrate (LOPRESSOR) 100 MG tablet Take 1 tablet (100 mg total) by mouth once for 1 dose. Please take 2 hours before CT.   montelukast (SINGULAIR) 10 MG tablet Take 10 mg by mouth at bedtime.   ondansetron (ZOFRAN) 4 MG tablet Take 4 mg by mouth every 8 (eight) hours as needed for nausea or vomiting.   pregabalin (LYRICA) 50 MG capsule Take 50 mg by mouth at  bedtime.   solifenacin (VESICARE) 10 MG tablet Take 10 mg by mouth daily.   tamsulosin (FLOMAX) 0.4 MG CAPS capsule Take 0.4 mg by mouth daily after supper.   tiZANidine (ZANAFLEX) 4 MG tablet Take 4 mg by mouth every 8 (eight) hours as needed for muscle spasms.   vitamin B-12 (CYANOCOBALAMIN) 1000 MCG tablet Take 1,000 mcg by mouth daily.     Allergies:   Clarithromycin, Prednisone, Sertraline, and Sertraline hcl   Social History   Socioeconomic History   Marital status: Single    Spouse name: Not on file   Number of children: 0   Years of education: 16   Highest education level: Bachelor's degree (e.g., BA, AB, BS)  Occupational History   Occupation: Retired  Tobacco Use   Smoking status: Never   Smokeless tobacco: Never  Vaping Use   Vaping Use: Never used  Substance and Sexual Activity   Alcohol use: Not Currently   Drug use: Never   Sexual activity: Not Currently  Other Topics Concern   Not on file  Social History Narrative   Pt is single she lives alone has no children   Right handed   Social Determinants of Health   Financial Resource Strain: Not on file  Food Insecurity:  Not on file  Transportation Needs: Not on file  Physical Activity: Not on file  Stress: Not on file  Social Connections: Not on file     Family History: [name] history includes Breast cancer in her maternal aunt; Breast cancer (age of onset: 33) in an other family member; Emphysema in her father; Liver cancer in her maternal uncle; Lung cancer in her maternal uncle; Lymphoma (age of onset: 14) in her mother; Prostate cancer in an other family member; Stomach cancer in her paternal uncle.  ROS:   Please see the history of present illness.     All other systems reviewed and are negative.  EKGs/Labs/Other Studies Reviewed:    The following studies were reviewed today: Cardiac Studies & Procedures     STRESS TESTS  MYOCARDIAL PERFUSION IMAGING 04/05/2019  Narrative  The left ventricular ejection fraction is hyperdynamic (>65%).  Nuclear stress EF: 66%.  There was no ST segment deviation noted during stress.  Defect 1: There is a defect present in the basal inferior and mid inferior location. Suggests diaphragmatic attenuation.  The study is normal.  This is a low risk study.   ECHOCARDIOGRAM  ECHOCARDIOGRAM COMPLETE 11/02/2021  Narrative ECHOCARDIOGRAM REPORT    Patient Name:   Karen Shannon Date of Exam: 11/01/2021 Medical Rec #:  [id number]            Height:       65.5 in Accession #:    [id number]           Weight:       188.4 lb Date of Birth:  03-Jul-1950             BSA:          1.940 m Patient Age:    71 years             BP:           124/78 mmHg Patient Gender: F                    HR:           65 bpm. Exam Location:  [place]  Procedure: 2D Echo, Cardiac Doppler, Color Doppler  and Strain Analysis  Indications:    Ventricular tachycardia (HCC) [I47.20 (ICD-10-CM)]; Peripheral vascular disease, unspecified (HCC) up to 69% stenosis on the lef internal carotic artery based on cardiac ultrasounds from June 2022 t [I73.9 (ICD-10-CM)];  Lumbar spondylosis [id number] (ICD-10-CM)]; Palpitations [R00.2 (ICD-10-CM)]; Late effect of cerebrovascular accident (CVA) right basal ganglion noted on MRI in June 2022 felt to be small vessel disease but [I69.30 (ICD-10-CM)]; Mitral valve prolapse [I34.1 (ICD-10-CM)]  History:        Patient has prior history of Echocardiogram examinations, most recent 07/23/2020. TIA and COPD, Aortic Valve Disease; Signs/Symptoms:Dyspnea.  Sonographer:    Margreta Journey RDCS Referring Phys: [id number] ROBERT J KRASOWSKI  IMPRESSIONS   1. GLS -15.7. Left ventricular ejection fraction, by estimation, is 55 to 60%. The left ventricle has normal function. The left ventricle has no regional wall motion abnormalities. Left ventricular diastolic parameters are consistent with Grade II diastolic dysfunction (pseudonormalization). 2. Right ventricular systolic function is normal. The right ventricular size is normal. There is normal pulmonary artery systolic pressure. 3. The mitral valve is normal in structure. No evidence of mitral valve regurgitation. No evidence of mitral stenosis. 4. The aortic valve is normal in structure. There is mild thickening of the aortic valve. Aortic valve regurgitation is not visualized. Mild aortic valve stenosis. Aortic valve mean gradient measures 16.0 mmHg. 5. The inferior vena cava is normal in size with greater than 50% respiratory variability, suggesting right atrial pressure of 3 mmHg.  FINDINGS Left Ventricle: GLS -15.7. Left ventricular ejection fraction, by estimation, is 55 to 60%. The left ventricle has normal function. The left ventricle has no regional wall motion abnormalities. The left ventricular internal cavity size was normal in size. There is no left ventricular hypertrophy. Left ventricular diastolic parameters are consistent with Grade II diastolic dysfunction (pseudonormalization).  Right Ventricle: The right ventricular size is normal. No increase in right  ventricular wall thickness. Right ventricular systolic function is normal. There is normal pulmonary artery systolic pressure. The tricuspid regurgitant velocity is 2.28 m/s, and with an assumed right atrial pressure of 8 mmHg, the estimated right ventricular systolic pressure is 28.8 mmHg.  Left Atrium: Left atrial size was normal in size.  Right Atrium: Right atrial size was normal in size.  Pericardium: There is no evidence of pericardial effusion.  Mitral Valve: The mitral valve is normal in structure. No evidence of mitral valve regurgitation. No evidence of mitral valve stenosis.  Tricuspid Valve: The tricuspid valve is normal in structure. Tricuspid valve regurgitation is mild . No evidence of tricuspid stenosis.  Aortic Valve: The aortic valve is normal in structure. There is mild thickening of the aortic valve. Aortic valve regurgitation is not visualized. Mild aortic stenosis is present. Aortic valve mean gradient measures 16.0 mmHg. Aortic valve peak gradient measures 25.4 mmHg. Aortic valve area, by VTI measures 0.97 cm.  Pulmonic Valve: The pulmonic valve was normal in structure. Pulmonic valve regurgitation is not visualized. No evidence of pulmonic stenosis.  Aorta: The aortic root is normal in size and structure.  Venous: The inferior vena cava is normal in size with greater than 50% respiratory variability, suggesting right atrial pressure of 3 mmHg.  IAS/Shunts: No atrial level shunt detected by color flow Doppler.   LEFT VENTRICLE PLAX 2D LVIDd:         3.00 cm   Diastology LVIDs:         2.20 cm   LV e' medial:    5.77 cm/s LV PW:  1.10 cm   LV E/e' medial:  18.9 LV IVS:        1.10 cm   LV e' lateral:   7.07 cm/s LVOT diam:     1.90 cm   LV E/e' lateral: 15.4 LV SV:         56 LV SV Index:   29 LVOT Area:     2.84 cm   RIGHT VENTRICLE             IVC RV Basal diam:  3.00 cm     IVC diam: 2.50 cm RV S prime:     11.20 cm/s TAPSE (M-mode): 2.3  cm  LEFT ATRIUM             Index        RIGHT ATRIUM           Index LA diam:        2.60 cm 1.34 cm/m   RA Area:     14.50 cm LA Vol (A2C):   53.2 ml 27.43 ml/m  RA Volume:   33.00 ml  17.01 ml/m LA Vol (A4C):   38.7 ml 19.95 ml/m LA Biplane Vol: 45.7 ml 23.56 ml/m AORTIC VALVE AV Area (Vmax):    1.02 cm AV Area (Vmean):   0.87 cm AV Area (VTI):     0.97 cm AV Vmax:           252.00 cm/s AV Vmean:          192.000 cm/s AV VTI:            0.579 m AV Peak Grad:      25.4 mmHg AV Mean Grad:      16.0 mmHg LVOT Vmax:         90.50 cm/s LVOT Vmean:        58.900 cm/s LVOT VTI:          0.199 m LVOT/AV VTI ratio: 0.34  AORTA Ao Root diam: 3.40 cm Ao Asc diam:  3.40 cm  MITRAL VALVE                TRICUSPID VALVE MV Area (PHT): 3.99 cm     TR Peak grad:   20.8 mmHg MV Decel Time: 190 msec     TR Vmax:        228.00 cm/s MV E velocity: 109.00 cm/s MV A velocity: 120.00 cm/s  SHUNTS MV E/A ratio:  0.91         Systemic VTI:  0.20 m Systemic Diam: 1.90 cm  Gypsy Balsam MD Electronically signed by Gypsy Balsam MD Signature Date/Time: 11/02/2021/12:03:57 PM    Final    MONITORS  LONG TERM MONITOR (3-14 DAYS) 02/20/2019  Narrative Karen Shannon, DOB [birth year], MRN [id number]  HOLTER MONITOR REPORT:    Date of test:                 01/22/2019 Duration of test:           13 Indication:                    Palpitations Ordering physician:  Georgeanna Lea, MD Referring physician:  Georgeanna Lea, MD   Baseline rhythm: Sinus  Minimum heart rate: 52 BPM.  Average heart rate: 78 BPM.  Maximal heart rate 869 BPM.  Atrial arrhythmia: Total of 11 narrow complex tachycardia fastest episode 16 beats at 154 bpm  Ventricular arrhythmia: 3 episodes of ventricular tachycardia longest  and fastest 12 beats at rate of 169  Conduction abnormality: None  Symptoms: Multiple trigger events for PVCs   Conclusion: Ventricular tachycardia: Total 3  episodes.  Longest 12 beats.  Asymptomatic. 11 narrow complex tachycardias Symptomatic PVCs  Interpreting  cardiologist: Gypsy Balsam, MD Date: 02/20/2019 9:07 PM            EKG:  EKG is not ordered today.    Recent Labs: 10/21/2021: BUN 17; Creatinine, Ser 0.73; NT-Pro BNP 257; Potassium 4.2; Sodium 145  Recent Lipid Panel    Component Value Date/Time   CHOL 174 02/28/2019 1447   TRIG 83 02/28/2019 1447   HDL 57 02/28/2019 1447   CHOLHDL 3.1 02/28/2019 1447   LDLCALC 102 (H) 02/28/2019 1447   LDLDIRECT 89 10/29/2020 1038     Risk Assessment/Calculations:                Physical Exam:    VS:  BP 110/70 (BP Location: Right Arm, Patient Position: Sitting, Cuff Size: Normal)   Pulse 70   Ht 5' 5.5" (1.664 m)   Wt 194 lb (88 kg)   SpO2 96%   BMI 31.79 kg/m     Wt Readings from Last 3 Encounters:  05/28/22 194 lb (88 kg)  12/20/21 190 lb 3.2 oz (86.3 kg)  10/21/21 188 lb 6.4 oz (85.5 kg)     GEN:  Well nourished, well developed in no acute distress HEENT: Normal NECK: No JVD; No carotid bruits LYMPHATICS: No lymphadenopathy CARDIAC: RRR, no murmurs, rubs, gallops RESPIRATORY:  Clear to auscultation without rales, wheezing or rhonchi  ABDOMEN: Soft, non-tender, non-distended MUSCULOSKELETAL:  No edema; No deformity  SKIN: Warm and dry NEUROLOGIC:  Alert and oriented x 3 PSYCHIATRIC:  Normal affect   ASSESSMENT:    1. Precordial pain   2. Ventricular tachycardia   3. Palpitations   4. Carotid stenosis, left   5. Mixed hyperlipidemia    PLAN:    In order of problems listed above:  Precordial pain-most recent stress test in 2021 was unrevealing for ischemia.  Her chest pain has mixed features, does occur with exertion and she has known carotid artery stenosis.  Will arrange for coronary CTA.  Continue aspirin 81 mg daily, continue metoprolol 25 mg daily.  Ventricular tachycardia/palpitations -she wore a Holter monitor in 2021 which revealed episodes  of ventricular tachycardia, she did not have any symptoms associated with this.  Palpitations have been quiescent for her.  Carotid artery stenosis -most recent carotid duplex on 04/15/2022 revealed no stenosis right ICA, 40 to 59% stenosis left ICA.  Followed by vascular.  Continue atorvastatin 80 mg daily.  HLD-most recent LDL on 02/22/2022 was 79, continue Lipitor 80 mg daily.  Depending on what her upcoming coronary CTA reveals may have to add Zetia for further management.     Disposition-coronary CTA, return in 3 months.   Medication Adjustments/Labs and Tests Ordered: Current medicines are reviewed at length with the patient today.  Concerns regarding medicines are outlined above.  Orders Placed This Encounter  Procedures   CT CORONARY MORPH W/CTA COR W/SCORE W/CA W/CM &/OR WO/CM   Basic Metabolic Panel (BMET)   Meds ordered this encounter  Medications   metoprolol tartrate (LOPRESSOR) 100 MG tablet    Sig: Take 1 tablet (100 mg total) by mouth once for 1 dose. Please take 2 hours before CT.    Dispense:  1 tablet    Refill:  0    Patient Instructions  Medication Instructions:  Your physician recommends that you continue on your current medications as directed. Please refer to the Current Medication list given to you today.  *If you need a refill on your cardiac medications before your next appointment, please call your pharmacy*   Lab Work: Your physician recommends that you return for lab work in:   Labs today: BMP  If you have labs (blood work) drawn today and your tests are completely normal, you will receive your results only by: MyChart Message (if you have MyChart) OR A paper copy in the mail If you have any lab test that is abnormal or we need to change your treatment, we will call you to review the results.   Testing/Procedures:  Please follow these instructions carefully (unless otherwise directed):  On the Night Before the Test: Be sure to Drink plenty of  water. Do not consume any caffeinated/decaffeinated beverages or chocolate 12 hours prior to your test. Do not take any antihistamines 12 hours prior to your test.  On the Day of the Test: Drink plenty of water until 1 hour prior to the test. Do not eat any food 1 hour prior to test. You may take your regular medications prior to the test.  Take metoprolol (Lopressor) two hours prior to test. If you take Furosemide please HOLD on the morning of the test. FEMALES- please wear underwire-free bra if available, avoid dresses & tight clothing      After the Test: Drink plenty of water. After receiving IV contrast, you may experience a mild flushed feeling. This is normal. On occasion, you may experience a mild rash up to 24 hours after the test. This is not dangerous. If this occurs, you can take Benadryl 25 mg and increase your fluid intake. If you experience trouble breathing, this can be serious. If it is severe call 911 IMMEDIATELY. If it is mild, please call our office. If you take any of these medications: Glipizide/Metformin, Avandament, Glucavance, please do not take 48 hours after completing test unless otherwise instructed.  We will call to schedule your test 2-4 weeks out understanding that some insurance companies will need an authorization prior to the service being performed.   Follow-Up: At [facility], you and your health needs are our priority.  As part of our continuing mission to provide you with exceptional heart care, we have created designated Provider Care Teams.  These Care Teams include your primary Cardiologist (physician) and Advanced Practice Providers (APPs -  Physician Assistants and Nurse Practitioners) who all work together to provide you with the care you need, when you need it.  We recommend signing up for the patient portal called "MyChart".  Sign up information is provided on this After Visit Summary.  MyChart is used to connect with patients for  Virtual Visits (Telemedicine).  Patients are able to view lab/test results, encounter notes, upcoming appointments, etc.  Non-urgent messages can be sent to your provider as well.   To learn more about what you can do with MyChart, go to [email or web address].    Your next appointment:   3 month(s)  Provider:   Wallis Bamberg, NP [facility])    Other Instructions None    Signed, Flossie Dibble, NP  05/28/2022 1:45 PM    [facility]

## 2022-05-29 LAB — BASIC METABOLIC PANEL WITH GFR
BUN/Creatinine Ratio: 21 (ref 12–28)
BUN: 17 mg/dL (ref 8–27)
CO2: 25 mmol/L (ref 20–29)
Calcium: 9.1 mg/dL (ref 8.7–10.3)
Chloride: 104 mmol/L (ref 96–106)
Creatinine, Ser: 0.8 mg/dL (ref 0.57–1.00)
Glucose: 90 mg/dL (ref 70–99)
Potassium: 4.4 mmol/L (ref 3.5–5.2)
Sodium: 142 mmol/L (ref 134–144)
eGFR: 79 mL/min/1.73

## 2022-06-03 DIAGNOSIS — R5383 Other fatigue: Secondary | ICD-10-CM | POA: Diagnosis not present

## 2022-06-03 DIAGNOSIS — L219 Seborrheic dermatitis, unspecified: Secondary | ICD-10-CM | POA: Diagnosis not present

## 2022-06-03 DIAGNOSIS — L821 Other seborrheic keratosis: Secondary | ICD-10-CM | POA: Diagnosis not present

## 2022-06-03 DIAGNOSIS — L728 Other follicular cysts of the skin and subcutaneous tissue: Secondary | ICD-10-CM | POA: Diagnosis not present

## 2022-06-03 DIAGNOSIS — G4733 Obstructive sleep apnea (adult) (pediatric): Secondary | ICD-10-CM | POA: Diagnosis not present

## 2022-06-03 DIAGNOSIS — J4540 Moderate persistent asthma, uncomplicated: Secondary | ICD-10-CM | POA: Diagnosis not present

## 2022-06-07 DIAGNOSIS — M19071 Primary osteoarthritis, right ankle and foot: Secondary | ICD-10-CM | POA: Diagnosis not present

## 2022-06-07 DIAGNOSIS — E559 Vitamin D deficiency, unspecified: Secondary | ICD-10-CM | POA: Diagnosis not present

## 2022-06-07 DIAGNOSIS — M19072 Primary osteoarthritis, left ankle and foot: Secondary | ICD-10-CM | POA: Diagnosis not present

## 2022-06-07 DIAGNOSIS — R002 Palpitations: Secondary | ICD-10-CM | POA: Diagnosis not present

## 2022-06-07 DIAGNOSIS — N3281 Overactive bladder: Secondary | ICD-10-CM | POA: Diagnosis not present

## 2022-06-07 DIAGNOSIS — E785 Hyperlipidemia, unspecified: Secondary | ICD-10-CM | POA: Diagnosis not present

## 2022-06-07 DIAGNOSIS — E0590 Thyrotoxicosis, unspecified without thyrotoxic crisis or storm: Secondary | ICD-10-CM | POA: Diagnosis not present

## 2022-06-07 DIAGNOSIS — M160 Bilateral primary osteoarthritis of hip: Secondary | ICD-10-CM | POA: Diagnosis not present

## 2022-06-07 DIAGNOSIS — Z86718 Personal history of other venous thrombosis and embolism: Secondary | ICD-10-CM | POA: Diagnosis not present

## 2022-06-07 DIAGNOSIS — J4540 Moderate persistent asthma, uncomplicated: Secondary | ICD-10-CM | POA: Diagnosis not present

## 2022-06-09 DIAGNOSIS — Z79899 Other long term (current) drug therapy: Secondary | ICD-10-CM | POA: Diagnosis not present

## 2022-06-09 DIAGNOSIS — M4316 Spondylolisthesis, lumbar region: Secondary | ICD-10-CM | POA: Diagnosis not present

## 2022-06-09 DIAGNOSIS — Z79891 Long term (current) use of opiate analgesic: Secondary | ICD-10-CM | POA: Diagnosis not present

## 2022-06-09 DIAGNOSIS — M5416 Radiculopathy, lumbar region: Secondary | ICD-10-CM | POA: Diagnosis not present

## 2022-06-09 DIAGNOSIS — G894 Chronic pain syndrome: Secondary | ICD-10-CM | POA: Diagnosis not present

## 2022-06-12 DIAGNOSIS — R072 Precordial pain: Secondary | ICD-10-CM | POA: Diagnosis not present

## 2022-06-12 DIAGNOSIS — I2510 Atherosclerotic heart disease of native coronary artery without angina pectoris: Secondary | ICD-10-CM | POA: Diagnosis not present

## 2022-06-13 ENCOUNTER — Telehealth: Payer: Self-pay | Admitting: Cardiology

## 2022-06-13 ENCOUNTER — Telehealth: Payer: Self-pay

## 2022-06-13 DIAGNOSIS — E785 Hyperlipidemia, unspecified: Secondary | ICD-10-CM

## 2022-06-13 MED ORDER — EZETIMIBE 10 MG PO TABS: 10.0000 mg | ORAL_TABLET | Freq: Every day | ORAL | 3 refills | Status: DC

## 2022-06-13 NOTE — Telephone Encounter (Signed)
Results reviewed with pt as per Jennifer Woody PA's note.  Pt verbalized understanding and had no additional questions. Routed to PCP.  

## 2022-06-13 NOTE — Telephone Encounter (Signed)
Results for Cardiac / Coronary CTA received from [facility], forward to Flossie Dibble, NP

## 2022-06-16 ENCOUNTER — Encounter: Payer: Self-pay | Admitting: Internal Medicine

## 2022-06-16 DIAGNOSIS — L270 Generalized skin eruption due to drugs and medicaments taken internally: Secondary | ICD-10-CM | POA: Diagnosis not present

## 2022-06-18 ENCOUNTER — Encounter (INDEPENDENT_AMBULATORY_CARE_PROVIDER_SITE_OTHER): Payer: Medicare Other | Admitting: Ophthalmology

## 2022-06-18 DIAGNOSIS — L209 Atopic dermatitis, unspecified: Secondary | ICD-10-CM | POA: Diagnosis not present

## 2022-06-18 DIAGNOSIS — L299 Pruritus, unspecified: Secondary | ICD-10-CM | POA: Diagnosis not present

## 2022-06-19 NOTE — Progress Notes (Signed)
Patient Care Team: Paulina Fusi, MD as PCP - General (Internal Medicine) Georgeanna Lea, MD as PCP - Cardiology (Cardiology) Dellia Beckwith, MD as Consulting Physician (Oncology) Drema Dallas, DO as Consulting Physician (Neurology) Cherly Beach, MD as Referring Physician (Dermatology)  Clinic Day: 06/20/2022  Referring physician: Paulina Fusi, MD  ASSESSMENT & PLAN:  Assessment & Plan: Malignant neoplasm of central portion of left female breast [facility]) History of stage I triple negative breast cancer, diagnosed January 2012, status post bilateral mastectomy. She remains without evidence of recurrence. Genetic testing in 2015 was negative.  TIA (transient ischemic attack) History of transient ischemic attacks, and in June 2022 had a CVA.  This impaired her memory, but she has been placed on medication with improvement.    DVT femoral (deep venous thrombosis) with thrombophlebitis, left (HCC) Deep venous thrombosis of the left lower extremity in May 2021. Recurrent provoked deep venous thrombosis of the right lower extremity in February 2022, for which she continues aspirin  81 mg daily daily  Basal cell carcinoma of scalp The patient felt a nodule and Dr. Fatima Blank had resected a skin cancer years ago and so checks her regularly.  May 2023 a biopsy was positive for basal cell carcinoma and so a wide excision was done but they were unable to close completely. Final pathology still showed residual basal cell carcinoma but with clear margins. She has healed well from this.  Tongue lesion Pathology was benign. I will plan to obtain this for review.  Degenerative Disc Disease  She has 2 pinched nerves of the lower spine. She has not tolerated any of the medications used for neuropathic pain.   Plan She has been found to have 2 pinched nerves in her lower spine but has not tolerated Lyrica or Cymbalta.  She then had a CT scan with contrast later in the week and that  aggravated it even more. Her CT of the heart was done last week and shows mild coronary artery disease. Ultrasound of the carotid arteries was done in March and did reveal stenosis of the left carotid at 40-59% but not of the right carotid artery. Her BMP from April was normal. I will see her back in 6 months for reevaluation. Dr. Tomasa Blase does her blood work. She understands and agrees with this plan of care.  She knows to contact our office if she develops concerns prior to her next appointment.  I provided 15 minutes of face-to-face time during this this encounter and > 50% was spent counseling as documented under my assessment and plan.    Dellia Beckwith, MD  [facility] [address] [place] [address] Dept: [phone] Dept Fax: [phone]   No orders of the defined types were placed in this encounter.     CHIEF COMPLAINT:  CC: A 72 y.o. old female with history of breast cancer   Current Treatment:  Surveillance  INTERVAL HISTORY:  Karen Shannon is here today for repeat clinical assessment for her history of breast cancer. Patient states that she feels ok and complains of 2 pinched nerves in her lower spine. She was placed on Pregabalin but that caused her to gain weight so she stopped. She was then placed on Cymbalta and she had a allergic reaction to it and became nauseated and developed blisters of her face. She then had a CT scan with contrast later in the week and that aggravated it even  more. Her CT of the heart was done last week and shows mild coronary artery disease. Ultrasound of the carotid arteries was done in March and did reveal stenosis of the left carotid at 40-59% but not of the right carotid artery. Her BMP from April was normal. I will see her back in 6 months for reevaluation. Dr. Tomasa Blase does her labs. She denies signs of infection such as sore throat, sinus drainage, cough, or urinary symptoms.   She denies fevers or recurrent chills. She denies pain. She denies nausea, vomiting, chest pain, dyspnea or cough. Her appetite is very good and her weight  3 weeks .  I have reviewed the past medical history, past surgical history, social history and family history with the patient and they are unchanged from previous note.  ALLERGIES:  is allergic to clarithromycin, prednisone, sertraline, sertraline hcl, and duloxetine hcl.  MEDICATIONS:  Current Outpatient Medications  Medication Sig Dispense Refill   ascorbic acid (VITAMIN C) 500 MG tablet Take 500 mg by mouth daily.     aspirin 81 MG chewable tablet Chew 81 mg by mouth daily.     atorvastatin (LIPITOR) 80 MG tablet Take 1 tablet by mouth once daily 90 tablet 2   calcium carbonate (SUPER CALCIUM) 1500 (600 Ca) MG TABS tablet Take 1 tablet by mouth daily.     Cholecalciferol 25 MCG (1000 UT) capsule Take 1,000 Units by mouth daily.     dicyclomine (BENTYL) 10 MG capsule Take 10 mg by mouth as needed for spasms (IBS).     diphenoxylate-atropine (LOMOTIL) 2.5-0.025 MG tablet Take 1 tablet by mouth as needed for diarrhea or loose stools.     donepezil (ARICEPT) 10 MG tablet Take 10 mg by mouth at bedtime.     dorzolamide (TRUSOPT) 2 % ophthalmic solution Place 1 drop into the right eye 2 (two) times daily.     ergocalciferol (VITAMIN D2) 50000 units capsule Take 50,000 Units by mouth once a week.     ezetimibe (ZETIA) 10 MG tablet Take 1 tablet (10 mg total) by mouth daily. 90 tablet 3   furosemide (LASIX) 20 MG tablet Take 20 mg by mouth daily.     HYDROcodone-acetaminophen (NORCO) 7.5-325 MG tablet Take 1 tablet by mouth 3 (three) times daily.     memantine (NAMENDA) 5 MG tablet Take 5 mg by mouth 2 (two) times daily.     metoprolol succinate (TOPROL-XL) 25 MG 24 hr tablet TAKE 1 TABLET BY MOUTH EVERY DAY 90 tablet 2   montelukast (SINGULAIR) 10 MG tablet Take 10 mg by mouth at bedtime.     neomycin-polymyxin b-dexamethasone (MAXITROL)  3.5-10000-0.1 OINT      ondansetron (ZOFRAN) 4 MG tablet Take 4 mg by mouth every 8 (eight) hours as needed for nausea or vomiting.     solifenacin (VESICARE) 10 MG tablet Take 10 mg by mouth daily.     tamsulosin (FLOMAX) 0.4 MG CAPS capsule Take 0.4 mg by mouth daily after supper.     tiZANidine (ZANAFLEX) 4 MG tablet Take 4 mg by mouth every 8 (eight) hours as needed for muscle spasms.     vitamin B-12 (CYANOCOBALAMIN) 1000 MCG tablet Take 1,000 mcg by mouth daily.     No current facility-administered medications for this visit.    HISTORY OF PRESENT ILLNESS:   Oncology History  Malignant neoplasm of central portion of left female breast   Initial Diagnosis   Malignant neoplasm of central portion of left female breast (HCC)  02/21/2010 Cancer Staging   Staging form: Breast, AJCC 7th Edition - Clinical stage from 02/21/2010: Stage IA (T1b, N0, M0) - Signed by Dellia Beckwith, MD on 12/21/2020 Staged by: Managing physician Diagnostic confirmation: Positive histology Specimen type: Excision Histopathologic type: Infiltrating duct carcinoma, NOS Stage prefix: Initial diagnosis Laterality: Left Tumor size (mm): 9 Method of lymph node assessment: Sentinel lymph node biopsy Histologic grade (G): G1 Lymph-vascular invasion (LVI): LVI not present (absent)/not identified Residual tumor (R): R0 - None Paget's disease: Negative Tumor grade (Scarff-Bloom-Richardson system): G1 Estrogen receptor status: Negative Progesterone receptor status: Negative HER2 status: Negative Stage used in treatment planning: Yes National guidelines used in treatment planning: Yes Type of national guideline used in treatment planning: NCCN   11/18/2013 Genetic Testing   Negative genetic testing on the BreastNext panel.  PALB2 p.L100F VUS.  The BreastNext gene panel offered by [employer] includes sequencing and rearrangement analysis for the following 17 genes:  ATM, BARD1, BRCA1, BRCA2, BRIP1, CDH1, CHEK2,  MRE11A, MUTYH, NBN, NF1, PALB2, PTEN, RAD50, RAD51C, RAD51D, and TP53.   The report date was 11/18/2013.  UPDATE: PALB2 p.L100F has been reclassified to Likely Benign.  The amended report date is November 09, 2020.       REVIEW OF SYSTEMS:   Review of Systems  Constitutional: Negative.  Negative for appetite change, chills, diaphoresis, fatigue, fever and unexpected weight change.  HENT:  Negative.  Negative for hearing loss, lump/mass, mouth sores, nosebleeds, sore throat, tinnitus, trouble swallowing and voice change.   Eyes: Negative.  Negative for eye problems and icterus.  Respiratory: Negative.  Negative for chest tightness, cough, hemoptysis, shortness of breath and wheezing.   Cardiovascular: Negative.  Negative for chest pain, leg swelling and palpitations.  Gastrointestinal: Negative.  Negative for abdominal distention, abdominal pain, blood in stool, constipation, diarrhea, nausea, rectal pain and vomiting.  Endocrine: Negative.   Genitourinary: Negative.  Negative for bladder incontinence, difficulty urinating, dyspareunia, dysuria, frequency, hematuria, menstrual problem, nocturia, pelvic pain, vaginal bleeding and vaginal discharge.   Musculoskeletal:  Positive for arthralgias, back pain and myalgias. Negative for flank pain, gait problem, neck pain and neck stiffness.       2 pinched nerves in her spine  Skin: Negative.  Negative for itching, rash and wound.  Neurological:  Negative for dizziness, extremity weakness, gait problem, headaches, light-headedness, numbness, seizures and speech difficulty.  Hematological: Negative.  Negative for adenopathy. Does not bruise/bleed easily.  Psychiatric/Behavioral: Negative.  Negative for confusion, decreased concentration, depression, sleep disturbance and suicidal ideas. The patient is not nervous/anxious.    VITALS:  Blood pressure (!) 116/92, pulse 78, temperature 98 F (36.7 C), temperature source Oral, resp. rate 16, height 5'  5.5" (1.664 m), weight 190 lb 11.2 oz (86.5 kg), SpO2 100 %.  Wt Readings from Last 3 Encounters:  06/20/22 190 lb 11.2 oz (86.5 kg)  05/28/22 194 lb (88 kg)  12/20/21 190 lb 3.2 oz (86.3 kg)    Body mass index is 31.25 kg/m.  Performance status (ECOG): 1 - Symptomatic but completely ambulatory  PHYSICAL EXAM:   Physical Exam Vitals and nursing note reviewed.  Constitutional:      General: She is not in acute distress.    Appearance: Normal appearance. She is normal weight. She is not ill-appearing, toxic-appearing or diaphoretic.  HENT:     Head: Normocephalic and atraumatic.     Right Ear: Tympanic membrane, ear canal and external ear normal. There is no impacted cerumen.     Left  Ear: Tympanic membrane, ear canal and external ear normal.     Nose: Nose normal. No congestion or rhinorrhea.     Mouth/Throat:     Mouth: Mucous membranes are moist.     Pharynx: Oropharynx is clear. No oropharyngeal exudate or posterior oropharyngeal erythema.     Comments:   Eyes:     General: No scleral icterus.       Right eye: No discharge.        Left eye: No discharge.     Extraocular Movements: Extraocular movements intact.     Conjunctiva/sclera: Conjunctivae normal.     Pupils: Pupils are equal, round, and reactive to light.  Neck:     Vascular: No carotid bruit.  Cardiovascular:     Rate and Rhythm: Normal rate and regular rhythm.     Pulses: Normal pulses.     Heart sounds: Murmur heard.     Systolic murmur is present with a grade of 2/6.     No friction rub. No gallop.  Pulmonary:     Effort: Pulmonary effort is normal. No respiratory distress.     Breath sounds: Normal breath sounds. No stridor. No wheezing, rhonchi or rales.  Chest:     Chest wall: No tenderness.     Comments: Bilateral mastectomies are negative Abdominal:     General: Bowel sounds are normal. There is no distension.     Palpations: Abdomen is soft. There is no hepatomegaly, splenomegaly or mass.      Tenderness: There is no abdominal tenderness. There is no right CVA tenderness, left CVA tenderness, guarding or rebound.     Hernia: No hernia is present.  Musculoskeletal:        General: No swelling, tenderness, deformity or signs of injury. Normal range of motion.     Cervical back: Normal range of motion and neck supple. No rigidity or tenderness.     Right lower leg: No edema.     Left lower leg: No edema.  Lymphadenopathy:     Cervical: No cervical adenopathy.  Skin:    General: Skin is warm and dry.     Coloration: Skin is not jaundiced or pale.     Findings: No bruising, erythema, lesion or rash.  Neurological:     General: No focal deficit present.     Mental Status: She is alert and oriented to person, place, and time. Mental status is at baseline.     Cranial Nerves: No cranial nerve deficit.     Sensory: No sensory deficit.     Motor: No weakness.     Coordination: Coordination normal.     Gait: Gait normal.     Deep Tendon Reflexes: Reflexes normal.  Psychiatric:        Mood and Affect: Mood normal.        Behavior: Behavior normal.        Thought Content: Thought content normal.        Judgment: Judgment normal.      LABORATORY DATA:  I have reviewed the data as listed    Component Value Date/Time   NA 142 05/28/2022 1200   K 4.4 05/28/2022 1200   CL 104 05/28/2022 1200   CO2 25 05/28/2022 1200   GLUCOSE 90 05/28/2022 1200   BUN 17 05/28/2022 1200   CREATININE 0.80 05/28/2022 1200   CALCIUM 9.1 05/28/2022 1200    No results found for: "SPEP", "UPEP"  No results found for: "WBC", "NEUTROABS", "HGB", "HCT", "MCV", "  PLT"    Chemistry      Component Value Date/Time   NA 142 05/28/2022 1200   K 4.4 05/28/2022 1200   CL 104 05/28/2022 1200   CO2 25 05/28/2022 1200   BUN 17 05/28/2022 1200   CREATININE 0.80 05/28/2022 1200      Component Value Date/Time   CALCIUM 9.1 05/28/2022 1200       RADIOGRAPHIC STUDIES: I have personally reviewed the  radiological images as listed and agreed with the findings in the report. OCT, Retina - OU - Both Eyes  Result Date: 07/02/2022 Right Eye Quality was good. Central Foveal Thickness: 270. Progression has been stable. Findings include normal foveal contour, no IRF, no SRF, retinal drusen . Left Eye Quality was good. Central Foveal Thickness: 272. Progression has been stable. Findings include normal foveal contour, no IRF, no SRF, retinal drusen (Mild drusen). Notes *Images captured and stored on drive Diagnosis / Impression: Nonexudative ARMD OU NFP, no IRF/SRF OU Clinical management: See below Abbreviations: NFP - Normal foveal profile. CME - cystoid macular edema. PED - pigment epithelial detachment. IRF - intraretinal fluid. SRF - subretinal fluid. EZ - ellipsoid zone. ERM - epiretinal membrane. ORA - outer retinal atrophy. ORT - outer retinal tubulation. SRHM - subretinal hyper-reflective material. IRHM - intraretinal hyper-reflective material   Echo 11/01/2021 GLS -15.7. Left ventricular ejection fraction, by estimation, is 55 to 60%. The left ventricle has normal function. The left ventricle has no regional wall motion abnormalities. Left ventricular diastolic parameters are consistent with Grade II diastolic dysfunction (pseudonormalization). Right ventricular systolic function is normal. The right ventricular size is normal. There is normal pulmonary artery systolic pressure. The mitral valve is normal in structure. No evidence of mitral valve regurgitation. No evidence of mitral stenosis. The aortic valve is normal in structure. There is mild thickening of the aortic valve. Aortic valve regurgitation is not visualized. Mild aortic valve stenosis. Aortic valve mean gradient measures 16.0 mmHg. The inferior vena cava is normal in size with greater than 50% respiratory variability, suggesting right atrial pressure of 3 mmHg   I,Jasmine M Lassiter,acting as a scribe for Dellia Beckwith,  MD.,have documented all relevant documentation on the behalf of Dellia Beckwith, MD,as directed by  Dellia Beckwith, MD while in the presence of Dellia Beckwith, MD.

## 2022-06-20 ENCOUNTER — Encounter: Payer: Self-pay | Admitting: Oncology

## 2022-06-20 ENCOUNTER — Inpatient Hospital Stay: Payer: Medicare Other | Attending: Oncology | Admitting: Oncology

## 2022-06-20 VITALS — BP 116/92 | HR 78 | Temp 98.0°F | Resp 16 | Ht 65.5 in | Wt 190.7 lb

## 2022-06-20 DIAGNOSIS — Z780 Asymptomatic menopausal state: Secondary | ICD-10-CM | POA: Diagnosis not present

## 2022-06-20 DIAGNOSIS — M8580 Other specified disorders of bone density and structure, unspecified site: Secondary | ICD-10-CM

## 2022-06-20 DIAGNOSIS — C50112 Malignant neoplasm of central portion of left female breast: Secondary | ICD-10-CM | POA: Diagnosis not present

## 2022-06-20 DIAGNOSIS — Z171 Estrogen receptor negative status [ER-]: Secondary | ICD-10-CM

## 2022-06-21 ENCOUNTER — Telehealth: Payer: Self-pay | Admitting: Oncology

## 2022-06-21 NOTE — Telephone Encounter (Signed)
Patient has been scheduled. Aware of appt date and time   Scheduling Message Entered by Gery Pray H on 06/20/2022 at 10:17 AM Priority: Routine EST PT 30  Department: [facility]  Provider:  Scheduling Notes:  RT 6 months - schedule after our move

## 2022-06-24 NOTE — Progress Notes (Signed)
[facility] - Clinic Note  06/30/2022     CHIEF COMPLAINT Patient presents for Retina Follow Up   HISTORY OF PRESENT ILLNESS: Karen Shannon is a 72 y.o. female who presents to the clinic today for:   HPI     Retina Follow Up   Patient presents with  Other.  In left eye.  This started 9 months ago.  I, the attending physician,  performed the HPI with the patient and updated documentation appropriately.        Comments   Patient here for 9 months retina follow up for vitreoretinal tuft OS. Patient states vision been ok. No eye pain. Has problem with dermatitis in eye. Dr Zenaida Niece put on ung QHS OU. Has 2 pinched nerves in spine put on 2 hydrocodone. Taking dorzolamide BID OD. Has an appointment with lid specialist for dropping lids.      Last edited by Rennis Chris, MD on 07/02/2022  1:00 AM.    Pt feels like vision is stable, she has been seeing Dr. Zenaida Niece for dermatitis around her eyes, she is using Maxitrol ung, she is having eyelid sx with Dr. Delynn Flavin   Referring physician: Paulina Fusi, MD [address] [place],  [place] [address]  HISTORICAL INFORMATION:  Selected notes from the MEDICAL RECORD NUMBER Referred by Dr. Alben Spittle for eval of PVD OU LEE: 10.20.2022 Ocular Hx-PVD OU, cataracts, glc suspect   CURRENT MEDICATIONS: Current Outpatient Medications (Ophthalmic Drugs)  Medication Sig   dorzolamide (TRUSOPT) 2 % ophthalmic solution Place 1 drop into the right eye 2 (two) times daily.   neomycin-polymyxin b-dexamethasone (MAXITROL) 3.5-10000-0.1 OINT    No current facility-administered medications for this visit. (Ophthalmic Drugs)   Current Outpatient Medications (Other)  Medication Sig   ascorbic acid (VITAMIN C) 500 MG tablet Take 500 mg by mouth daily.   aspirin 81 MG chewable tablet Chew 81 mg by mouth daily.   atorvastatin (LIPITOR) 80 MG tablet Take 1 tablet by mouth once daily   calcium carbonate (SUPER CALCIUM)  1500 (600 Ca) MG TABS tablet Take 1 tablet by mouth daily.   Cholecalciferol 25 MCG (1000 UT) capsule Take 1,000 Units by mouth daily.   dicyclomine (BENTYL) 10 MG capsule Take 10 mg by mouth as needed for spasms (IBS).   diphenoxylate-atropine (LOMOTIL) 2.5-0.025 MG tablet Take 1 tablet by mouth as needed for diarrhea or loose stools.   donepezil (ARICEPT) 10 MG tablet Take 10 mg by mouth at bedtime.   ergocalciferol (VITAMIN D2) 50000 units capsule Take 50,000 Units by mouth once a week.   ezetimibe (ZETIA) 10 MG tablet Take 1 tablet (10 mg total) by mouth daily.   furosemide (LASIX) 20 MG tablet Take 20 mg by mouth daily.   HYDROcodone-acetaminophen (NORCO) 7.5-325 MG tablet Take 1 tablet by mouth 3 (three) times daily.   memantine (NAMENDA) 5 MG tablet Take 5 mg by mouth 2 (two) times daily.   metoprolol succinate (TOPROL-XL) 25 MG 24 hr tablet TAKE 1 TABLET BY MOUTH EVERY DAY   montelukast (SINGULAIR) 10 MG tablet Take 10 mg by mouth at bedtime.   ondansetron (ZOFRAN) 4 MG tablet Take 4 mg by mouth every 8 (eight) hours as needed for nausea or vomiting.   solifenacin (VESICARE) 10 MG tablet Take 10 mg by mouth daily.   tamsulosin (FLOMAX) 0.4 MG CAPS capsule Take 0.4 mg by mouth daily after supper.   tiZANidine (ZANAFLEX) 4 MG tablet Take 4  mg by mouth every 8 (eight) hours as needed for muscle spasms.   vitamin B-12 (CYANOCOBALAMIN) 1000 MCG tablet Take 1,000 mcg by mouth daily.   No current facility-administered medications for this visit. (Other)   REVIEW OF SYSTEMS: ROS   Positive for: Neurological, Skin, Musculoskeletal, Cardiovascular, Eyes, Respiratory Negative for: Constitutional, Gastrointestinal, Genitourinary, HENT, Endocrine, Psychiatric, Allergic/Imm, Heme/Lymph Last edited by Laddie Aquas, COA on 06/30/2022 12:48 PM.      ALLERGIES Allergies  Allergen Reactions   Clarithromycin Diarrhea and Other (See Comments)   Prednisone Other (See Comments)    Passed  out Other reaction(s): other   Sertraline Diarrhea   Sertraline Hcl Diarrhea   Duloxetine Hcl Itching, Nausea Only, Other (See Comments), Rash and Swelling   PAST MEDICAL HISTORY Past Medical History:  Diagnosis Date   Acute deep vein thrombosis (DVT) of left peroneal vein 04/11/2019   Aortic stenosis 04/12/2021   Basal cell carcinoma 07/25/2021   Cervical spondylosis 03/18/2018   Chronic bilateral low back pain without sciatica 03/18/2018   Chronic foot pain, left 03/14/2019   Possible fracture base 2nd metatarsal   Chronic neck pain 03/18/2018   COPD (chronic obstructive pulmonary disease)    DDD (degenerative disc disease), cervical 03/18/2018   Displaced fracture of fifth metatarsal bone, right foot 03/2020   Dyspnea on exertion 01/27/2018   Facet arthritis, degenerative, lumbar spine 03/18/2018   Family history of malignant neoplasm of breast    Generalized anxiety disorder 01/24/2019   Possible history of panic attacks   Genetic testing 11/14/2020   Negative genetic testing on the BreastNext panel.  PALB2 p.L100F VUS.  The BreastNext gene panel offered by [facility] includes sequencing and rearrangement analysis for the following 17 genes:  ATM, BARD1, BRCA1, BRCA2, BRIP1, CDH1, CHEK2, MRE11A, MUTYH, NBN, NF1, PALB2, PTEN, RAD50, RAD51C, RAD51D, and TP53.   The report date was 11/18/2013.     UPDATE: PALB2 p.L100F has been reclassified to Likely B   Greater trochanteric bursitis of both hips 04/10/2018   Heart murmur 01/27/2018   Left leg swelling 02/23/2019   Lumbar spondylosis 03/18/2018   Malignant neoplasm of central portion of left female breast    S/p bilateral mastectomy   Mild cognitive impairment of uncertain or unknown etiology 03/20/2022   Mitral valve prolapse 01/27/2018   Neuropathy 06/17/2021   Obstructive sleep apnea    Variable CPAP use.   Osteopenia after menopause 12/21/2020   Other idiopathic scoliosis, lumbar region 03/18/2018   Pain of left calf  03/14/2019   Palpitations 01/25/2018   Peripheral vascular disease, unspecified 10/29/2020   up to 69% stenosis on the lef internal carotic artery based on cardiac ultrasounds from June 2022   Pes planus of left foot 02/23/2019   Pinched nerve 02/10/2021   Sprain of tarsometatarsal ligament of right foot 07/14/2019   Stasis dermatitis, acute 10/03/2019   Tenosynovitis of left ankle 04/11/2019   TIA (transient ischemic attack) 01/17/2019   Urge incontinence 07/28/2019   Varicose veins of left lower extremity    Ventricular tachycardia 02/28/2019   Past Surgical History:  Procedure Laterality Date   basal cell removed     Head   BUNIONECTOMY Right 1999   MASTECTOMY Bilateral 2012   NASAL SEPTUM SURGERY  1996   SKIN CANCER EXCISION  2018   Top of head    SKIN CANCER EXCISION  07/2019   Nose   TENDON TRANSFER Right 1999   foot   TONGUE BIOPSY  12/04/2021  Dr Marcheta Grammes   FAMILY HISTORY Family History  Problem Relation Age of Onset   Lymphoma Mother 71       deceased 64   Breast cancer Maternal Aunt        Dx 87s; Deceased 44s   Lung cancer Maternal Uncle    Stomach cancer Paternal Uncle        deceased 1   Liver cancer Maternal Uncle    Prostate cancer Other        3 mat cousins   Breast cancer Other 53       mother's mat half-sister   Emphysema Father    SOCIAL HISTORY Social History   Tobacco Use   Smoking status: Never   Smokeless tobacco: Never  Vaping Use   Vaping Use: Never used  Substance Use Topics   Alcohol use: Not Currently   Drug use: Never       OPHTHALMIC EXAM: Base Eye Exam     Visual Acuity (Snellen - Linear)       Right Left   Dist [place] 20/30 +2 20/25 -2   Dist ph [place] 20/25 -1 NI         Tonometry (Tonopen, 12:44 PM)       Right Left   Pressure 14 16         Pupils       Dark Light Shape React APD   Right 3 2 Round Brisk None   Left 3 2 Round Brisk None         Visual Fields (Counting fingers)       Left Right    Full  Full         Extraocular Movement       Right Left    Full, Ortho Full, Ortho         Neuro/Psych     Oriented x3: Yes   Mood/Affect: Normal         Dilation     Both eyes: 1.0% Mydriacyl, 2.5% Phenylephrine @ 12:44 PM           Slit Lamp and Fundus Exam     External Exam       Right Left   External Normal Normal         Slit Lamp Exam       Right Left   Lids/Lashes Dermatochalasis - upper lid Dermatochalasis - upper lid   Conjunctiva/Sclera White and quiet White and quiet   Cornea mild arcus, tear film debris, trace PEE mild arcus, trace tear film debris   Anterior Chamber Deep and quiet Deep and quiet   Iris Round and dilated Round and dilated   Lens 2-3+ Nuclear sclerosis, 2-3+ Cortical cataract 2-3+ Nuclear sclerosis, 2-3+ Cortical cataract   Anterior Vitreous Mild syneresis, Posterior vitreous detachment, mild vitreous condensations Mild syneresis, Posterior vitreous detachment, mild vitreous condensations         Fundus Exam       Right Left   Disc Pink and Sharp, +cupping, mild PPA Pink and Sharp   C/D Ratio 0.65 0.6   Macula Flat, Blunted foveal reflex, RPE mottling, rare, fine drusen, No heme or edema Flat, Blunted foveal reflex, RPE mottling, rare, fine drusen, No heme or edema   Vessels attenuated, mild tortuosity attenuated, Tortuous   Periphery Attached, mild reticular degeneration, No RT/RD, No heme Attached, focal pigmented CR scars with VR tuft at 0100 -- good laser changes, reticular degeneration, no new RT/RD  IMAGING AND PROCEDURES  Imaging and Procedures for 06/30/2022  OCT, Retina - OU - Both Eyes       Right Eye Quality was good. Central Foveal Thickness: 270. Progression has been stable. Findings include normal foveal contour, no IRF, no SRF, retinal drusen .   Left Eye Quality was good. Central Foveal Thickness: 272. Progression has been stable. Findings include normal foveal contour, no IRF, no SRF, retinal  drusen (Mild drusen).   Notes *Images captured and stored on drive  Diagnosis / Impression:  Nonexudative ARMD OU NFP, no IRF/SRF OU  Clinical management:  See below  Abbreviations: NFP - Normal foveal profile. CME - cystoid macular edema. PED - pigment epithelial detachment. IRF - intraretinal fluid. SRF - subretinal fluid. EZ - ellipsoid zone. ERM - epiretinal membrane. ORA - outer retinal atrophy. ORT - outer retinal tubulation. SRHM - subretinal hyper-reflective material. IRHM - intraretinal hyper-reflective material            ASSESSMENT/PLAN:    ICD-10-CM   1. Vitreoretinal tuft of left eye  Q14.1     2. Left retinal defect  H33.302     3. Posterior vitreous detachment of both eyes  H43.813 OCT, Retina - OU - Both Eyes    4. Early dry stage nonexudative age-related macular degeneration of both eyes  H35.3131 OCT, Retina - OU - Both Eyes    5. Essential hypertension  I10     6. Hypertensive retinopathy of both eyes  H35.033     7. Combined forms of age-related cataract of both eyes  H25.813      1-2 Pigmented CR scar with VR tuft OS  - pt with h/o PVD OS and persistent flashes/floaters  - exam shows pigmented CR scar with VR tuft at 0100  - laser retinopexy OS 11.08.22 -- good early laser changes in place  - no new RT/RD - f/u 9-12 months, DFE, OCT  3. PVD / vitreous syneresis OU  - symptomatic flashes/floaters -- improved  - Discussed findings and prognosis  - No RT or RD on 360 scleral depressed exam  - Reviewed s/s of RT/RD  - Strict return precautions for any such RT/RD signs/symptoms  4. Age related macular degeneration, non-exudative, both eyes  - early stage with early fine drusen  - The incidence, anatomy, and pathology of dry AMD, risk of progression, and the AREDS and AREDS 2 study including smoking risks discussed with patient.  - Recommend amsler grid monitoring  - f/u 9-12 months  5,6. Hypertensive retinopathy OU - discussed importance of  tight BP control - monitor  7. Mixed Cataract OU - The symptoms of cataract, surgical options, and treatments and risks were discussed with patient. - discussed diagnosis and progression - monitor  Ophthalmic Meds Ordered this visit:  No orders of the defined types were placed in this encounter.     Return for f/u 9-12 months, non-exu ARMD OU, DFE, OCT.  There are no Patient Instructions on file for this visit.  This document serves as a record of services personally performed by Karie Chimera, MD, PhD. It was created on their behalf by De Blanch, an ophthalmic technician. The creation of this record is the provider's dictation and/or activities during the visit.    Electronically signed by: De Blanch, OA, 07/02/22  1:02 AM  This document serves as a record of services personally performed by Karie Chimera, MD, PhD. It was created on their behalf by Glee Arvin. Manson Passey, OA an  ophthalmic technician. The creation of this record is the provider's dictation and/or activities during the visit.    Electronically signed by: Glee Arvin. [place], [place] 05.20.2024 1:02 AM  Karie Chimera, M.D., Ph.D. Diseases & Surgery of the Retina and Vitreous [facility]  I have reviewed the above documentation for accuracy and completeness, and I agree with the above. Karie Chimera, M.D., Ph.D. 07/02/22 1:03 AM  Abbreviations: M myopia (nearsighted); A astigmatism; H hyperopia (farsighted); P presbyopia; Mrx spectacle prescription;  CTL contact lenses; OD right eye; OS left eye; OU both eyes  XT exotropia; ET esotropia; PEK punctate epithelial keratitis; PEE punctate epithelial erosions; DES dry eye syndrome; MGD meibomian gland dysfunction; ATs artificial tears; PFAT's preservative free artificial tears; NSC nuclear sclerotic cataract; PSC posterior subcapsular cataract; ERM epi-retinal membrane; PVD posterior vitreous detachment; RD retinal detachment; DM diabetes  mellitus; DR diabetic retinopathy; NPDR non-proliferative diabetic retinopathy; PDR proliferative diabetic retinopathy; CSME clinically significant macular edema; DME diabetic macular edema; dbh dot blot hemorrhages; CWS cotton wool spot; POAG primary open angle glaucoma; C/D cup-to-disc ratio; HVF humphrey visual field; GVF goldmann visual field; OCT optical coherence tomography; IOP intraocular pressure; BRVO Branch retinal vein occlusion; CRVO central retinal vein occlusion; CRAO central retinal artery occlusion; BRAO branch retinal artery occlusion; RT retinal tear; SB scleral buckle; PPV pars plana vitrectomy; VH Vitreous hemorrhage; PRP panretinal laser photocoagulation; IVK intravitreal kenalog; VMT vitreomacular traction; MH Macular hole;  NVD neovascularization of the disc; NVE neovascularization elsewhere; AREDS age related eye disease study; ARMD age related macular degeneration; POAG primary open angle glaucoma; EBMD epithelial/anterior basement membrane dystrophy; ACIOL anterior chamber intraocular lens; IOL intraocular lens; PCIOL posterior chamber intraocular lens; Phaco/IOL phacoemulsification with intraocular lens placement; PRK photorefractive keratectomy; LASIK laser assisted in situ keratomileusis; HTN hypertension; DM diabetes mellitus; COPD chronic obstructive pulmonary disease

## 2022-06-26 ENCOUNTER — Ambulatory Visit: Payer: Medicare Other | Admitting: Cardiology

## 2022-06-30 ENCOUNTER — Ambulatory Visit (INDEPENDENT_AMBULATORY_CARE_PROVIDER_SITE_OTHER): Payer: Medicare Other | Admitting: Ophthalmology

## 2022-06-30 ENCOUNTER — Encounter: Payer: Self-pay | Admitting: Cardiology

## 2022-06-30 ENCOUNTER — Encounter (INDEPENDENT_AMBULATORY_CARE_PROVIDER_SITE_OTHER): Payer: Self-pay | Admitting: Ophthalmology

## 2022-06-30 DIAGNOSIS — H353131 Nonexudative age-related macular degeneration, bilateral, early dry stage: Secondary | ICD-10-CM | POA: Diagnosis not present

## 2022-06-30 DIAGNOSIS — I10 Essential (primary) hypertension: Secondary | ICD-10-CM | POA: Diagnosis not present

## 2022-06-30 DIAGNOSIS — H25813 Combined forms of age-related cataract, bilateral: Secondary | ICD-10-CM | POA: Diagnosis not present

## 2022-06-30 DIAGNOSIS — H43813 Vitreous degeneration, bilateral: Secondary | ICD-10-CM

## 2022-06-30 DIAGNOSIS — H35033 Hypertensive retinopathy, bilateral: Secondary | ICD-10-CM

## 2022-06-30 DIAGNOSIS — Q141 Congenital malformation of retina: Secondary | ICD-10-CM | POA: Diagnosis not present

## 2022-06-30 DIAGNOSIS — H33302 Unspecified retinal break, left eye: Secondary | ICD-10-CM | POA: Diagnosis not present

## 2022-07-02 ENCOUNTER — Encounter (INDEPENDENT_AMBULATORY_CARE_PROVIDER_SITE_OTHER): Payer: Self-pay | Admitting: Ophthalmology

## 2022-07-03 DIAGNOSIS — Z01818 Encounter for other preprocedural examination: Secondary | ICD-10-CM | POA: Diagnosis not present

## 2022-07-03 DIAGNOSIS — H0279 Other degenerative disorders of eyelid and periocular area: Secondary | ICD-10-CM | POA: Diagnosis not present

## 2022-07-03 DIAGNOSIS — H02831 Dermatochalasis of right upper eyelid: Secondary | ICD-10-CM | POA: Diagnosis not present

## 2022-07-03 DIAGNOSIS — H02422 Myogenic ptosis of left eyelid: Secondary | ICD-10-CM | POA: Diagnosis not present

## 2022-07-03 DIAGNOSIS — H57813 Brow ptosis, bilateral: Secondary | ICD-10-CM | POA: Diagnosis not present

## 2022-07-03 DIAGNOSIS — H02423 Myogenic ptosis of bilateral eyelids: Secondary | ICD-10-CM | POA: Diagnosis not present

## 2022-07-03 DIAGNOSIS — H02834 Dermatochalasis of left upper eyelid: Secondary | ICD-10-CM | POA: Diagnosis not present

## 2022-07-03 DIAGNOSIS — H53483 Generalized contraction of visual field, bilateral: Secondary | ICD-10-CM | POA: Diagnosis not present

## 2022-07-03 DIAGNOSIS — I6522 Occlusion and stenosis of left carotid artery: Secondary | ICD-10-CM | POA: Insufficient documentation

## 2022-07-03 DIAGNOSIS — H02421 Myogenic ptosis of right eyelid: Secondary | ICD-10-CM | POA: Diagnosis not present

## 2022-07-03 HISTORY — DX: Occlusion and stenosis of left carotid artery: I65.22

## 2022-07-08 ENCOUNTER — Telehealth: Payer: Self-pay

## 2022-07-08 NOTE — Telephone Encounter (Addendum)
   [facility] Pre-operative Risk Assessment    Request for surgical clearance:  What type of surgery is being performed? Bilateral upper eyelid external ptosis repair    When is this surgery scheduled? TBD   What type of clearance is required (medical clearance vs. Pharmacy clearance to hold med vs. Both)? Both  Are there any medications that need to be held prior to surgery and how long?Aspirin , holding length not specified  Practice name and name of physician performing surgery? [facility] Dr. Steele Sizer  What is your office phone number: [phone]    7.   What is your office fax number: [phone]  8.   Anesthesia type (None, local, MAC, general) ? MAC   Karen Shannon 07/08/2022, 4:51 PM  _________________________________________________________________   (provider comments below)

## 2022-07-10 DIAGNOSIS — M4316 Spondylolisthesis, lumbar region: Secondary | ICD-10-CM | POA: Diagnosis not present

## 2022-07-10 DIAGNOSIS — M5416 Radiculopathy, lumbar region: Secondary | ICD-10-CM | POA: Diagnosis not present

## 2022-07-14 NOTE — Telephone Encounter (Signed)
Redgie Grayer!  You saw this patient on 05/28/2022 and ordered a Coronary CTA which was performed on 06/12/2022 (in media tab). Will you please comment on medical clearance for bilateral upper eyelid external ptosis repair?  Please route your response to P CV DIV Preop. I will communicate with requesting office once you have given recommendations.   Thank you!  Carlos Levering, NP

## 2022-07-15 NOTE — Telephone Encounter (Signed)
   Name: Karen Shannon  DOB: 06-25-1950  MRN: [id number]   Primary Cardiologist: Gypsy Balsam, MD  Chart reviewed as part of pre-operative protocol coverage. Idali Sannes was last seen on 05/28/2022 by Wallis Bamberg, NP.  Per Victorino Dike "She is very active, can meet > 4 METS. No further ischemic testing is warranted. She can hold ASA 7 days prior although we prefer she continue throughout the peri-operative procedure if possible. She can proceed with surgery at an acceptable risk."  I will route this recommendation to the requesting party via Epic fax function and remove from pre-op pool. Please call with questions.  Carlos Levering, NP 07/15/2022, 3:45 PM

## 2022-07-21 DIAGNOSIS — H53483 Generalized contraction of visual field, bilateral: Secondary | ICD-10-CM | POA: Diagnosis not present

## 2022-07-23 ENCOUNTER — Other Ambulatory Visit: Payer: Self-pay | Admitting: Cardiology

## 2022-07-23 NOTE — Telephone Encounter (Signed)
Rx sent to pharmacy   

## 2022-07-28 DIAGNOSIS — N390 Urinary tract infection, site not specified: Secondary | ICD-10-CM | POA: Diagnosis not present

## 2022-07-30 DIAGNOSIS — E785 Hyperlipidemia, unspecified: Secondary | ICD-10-CM | POA: Diagnosis not present

## 2022-07-31 LAB — LIPID PANEL
Chol/HDL Ratio: 2.4 ratio (ref 0.0–4.4)
Cholesterol, Total: 127 mg/dL (ref 100–199)
HDL: 53 mg/dL
LDL Chol Calc (NIH): 63 mg/dL (ref 0–99)
Triglycerides: 49 mg/dL (ref 0–149)
VLDL Cholesterol Cal: 11 mg/dL (ref 5–40)

## 2022-07-31 LAB — HEPATIC FUNCTION PANEL
ALT: 23 IU/L (ref 0–32)
AST: 19 IU/L (ref 0–40)
Albumin: 3.8 g/dL (ref 3.8–4.8)
Alkaline Phosphatase: 91 IU/L (ref 44–121)
Bilirubin Total: 0.6 mg/dL (ref 0.0–1.2)
Bilirubin, Direct: 0.19 mg/dL (ref 0.00–0.40)
Total Protein: 5.8 g/dL — ABNORMAL LOW (ref 6.0–8.5)

## 2022-08-04 DIAGNOSIS — C44722 Squamous cell carcinoma of skin of right lower limb, including hip: Secondary | ICD-10-CM | POA: Diagnosis not present

## 2022-08-04 DIAGNOSIS — L219 Seborrheic dermatitis, unspecified: Secondary | ICD-10-CM | POA: Diagnosis not present

## 2022-08-04 DIAGNOSIS — L821 Other seborrheic keratosis: Secondary | ICD-10-CM | POA: Diagnosis not present

## 2022-08-07 DIAGNOSIS — R350 Frequency of micturition: Secondary | ICD-10-CM | POA: Diagnosis not present

## 2022-08-27 DIAGNOSIS — H02421 Myogenic ptosis of right eyelid: Secondary | ICD-10-CM | POA: Diagnosis not present

## 2022-08-27 DIAGNOSIS — H53483 Generalized contraction of visual field, bilateral: Secondary | ICD-10-CM | POA: Diagnosis not present

## 2022-08-27 DIAGNOSIS — H02422 Myogenic ptosis of left eyelid: Secondary | ICD-10-CM | POA: Diagnosis not present

## 2022-08-27 DIAGNOSIS — Z01818 Encounter for other preprocedural examination: Secondary | ICD-10-CM | POA: Diagnosis not present

## 2022-08-27 DIAGNOSIS — H02831 Dermatochalasis of right upper eyelid: Secondary | ICD-10-CM | POA: Diagnosis not present

## 2022-08-27 DIAGNOSIS — H02834 Dermatochalasis of left upper eyelid: Secondary | ICD-10-CM | POA: Diagnosis not present

## 2022-08-27 DIAGNOSIS — H02403 Unspecified ptosis of bilateral eyelids: Secondary | ICD-10-CM | POA: Insufficient documentation

## 2022-08-27 DIAGNOSIS — H02423 Myogenic ptosis of bilateral eyelids: Secondary | ICD-10-CM | POA: Diagnosis not present

## 2022-08-27 DIAGNOSIS — H57813 Brow ptosis, bilateral: Secondary | ICD-10-CM | POA: Diagnosis not present

## 2022-08-27 DIAGNOSIS — H0279 Other degenerative disorders of eyelid and periocular area: Secondary | ICD-10-CM | POA: Diagnosis not present

## 2022-08-27 HISTORY — DX: Unspecified ptosis of bilateral eyelids: H02.403

## 2022-08-28 ENCOUNTER — Ambulatory Visit: Payer: Medicare Other | Admitting: Cardiology

## 2022-08-31 NOTE — Progress Notes (Signed)
Cardiology Office Note:    Date:  09/01/2022   ID:  Karen Shannon, DOB 1950/04/21, MRN [id number]  PCP:  Karen Fusi, MD   [facility] Providers Cardiologist:  Karen Balsam, MD     Referring MD: Karen Fusi, MD   CC: follow up   History of Present Illness:    Karen Shannon is a 72 y.o. female with a hx of nonobstructive CAD per coronary CTA in 2024, DVT--could not afford Eliquis and takes aspirin, carotid artery stenosis followed by vascular, mitral valve prolapse, TIA, ventricular tachycardia, PVD, aortic stenosis, OSA on CPAP, COPD, breast cancer 2012, palpitations.  She initially established care with Dr. Bing Shannon on 01/27/2018 at the behest of her PCP for evaluation of palpitations and dizziness.  Previously she had been seen by Dr. Sherlyn Shannon, was diagnosed with MVP and experienced palpitations at that time as well.  Echo at this time revealed an EF of 55 to 60%, grade 1 DD.  She wore a Holter monitor in January 2020 which revealed normal sinus rhythm, infrequent PVCs and PACs.  Repeat monitor following CVA in 2021 revealed 3 episodes of ventricular tachycardia lasting 12 beats, however she was asymptomatic.  She underwent a stress test for further evaluation of her ventricular tachycardia, study was normal, low risk.   Echo in September 2023, EF 55 to 60%, grade 2 DD, no evidence of mitral valve regurgitation, mild thickening of the aortic valve.  Carotid ultrasound on 04/15/2022 revealed 40 to 50% left ICA stenosis, right normal without stenosis.  Evaluated in April 2024 for atypical type chest pain, it was hard for her to verbalize what exactly it felt like.  We arranged a coronary CTA which revealed a calcium score of 215 revealing mild nonobstructive CAD.  She presents today for follow-up, he has been doing well from a cardiac perspective.  She did recently undergo blepharoplasty and is healing from that, as well as recovering from UTI.  She had  recently began walking and is walking 30 minutes every day of the week.  She offers no complaints today related to her heart. She denies chest pain, palpitations, dyspnea, pnd, orthopnea, n, v, dizziness, syncope, edema, weight gain, or early satiety.    Past Medical History:  Diagnosis Date   Acute deep vein thrombosis (DVT) of left peroneal vein 04/11/2019   Aortic stenosis 04/12/2021   Basal cell carcinoma 07/25/2021   Cervical spondylosis 03/18/2018   Chronic bilateral low back pain without sciatica 03/18/2018   Chronic foot pain, left 03/14/2019   Possible fracture base 2nd metatarsal   Chronic neck pain 03/18/2018   COPD (chronic obstructive pulmonary disease)    DDD (degenerative disc disease), cervical 03/18/2018   Displaced fracture of fifth metatarsal bone, right foot 03/2020   Dyspnea on exertion 01/27/2018   Facet arthritis, degenerative, lumbar spine 03/18/2018   Family history of malignant neoplasm of breast    Generalized anxiety disorder 01/24/2019   Possible history of panic attacks   Genetic testing 11/14/2020   Negative genetic testing on the BreastNext panel.  PALB2 p.L100F VUS.  The BreastNext gene panel offered by [employer] includes sequencing and rearrangement analysis for the following 17 genes:  ATM, BARD1, BRCA1, BRCA2, BRIP1, CDH1, CHEK2, MRE11A, MUTYH, NBN, NF1, PALB2, PTEN, RAD50, RAD51C, RAD51D, and TP53.   The report date was 11/18/2013.     UPDATE: PALB2 p.L100F has been reclassified to Likely B   Greater trochanteric bursitis of both hips 04/10/2018   Heart  murmur 01/27/2018   Left carotid artery stenosis 07/03/2022   Left leg swelling 02/23/2019   Lumbar spondylosis 03/18/2018   Malignant neoplasm of central portion of left female breast    S/p bilateral mastectomy   Mild cognitive impairment of uncertain or unknown etiology 03/20/2022   Mitral valve prolapse 01/27/2018   Neuropathy 06/17/2021   Obstructive sleep apnea    Variable CPAP use.    Osteopenia after menopause 12/21/2020   Other idiopathic scoliosis, lumbar region 03/18/2018   Pain of left calf 03/14/2019   Palpitations 01/25/2018   Peripheral vascular disease, unspecified 10/29/2020   up to 69% stenosis on the lef internal carotic artery based on cardiac ultrasounds from June 2022   Pes planus of left foot 02/23/2019   Pinched nerve 02/10/2021   Sprain of tarsometatarsal ligament of right foot 07/14/2019   Stasis dermatitis, acute 10/03/2019   Tenosynovitis of left ankle 04/11/2019   TIA (transient ischemic attack) 01/17/2019   Urge incontinence 07/28/2019   Varicose veins of left lower extremity    Ventricular tachycardia 02/28/2019    Past Surgical History:  Procedure Laterality Date   basal cell removed     Head   BUNIONECTOMY Right 1999   MASTECTOMY Bilateral 2012   NASAL SEPTUM SURGERY  1996   SKIN CANCER EXCISION  2018   Top of head    SKIN CANCER EXCISION  07/2019   Nose   TENDON TRANSFER Right 1999   foot   TONGUE BIOPSY  12/04/2021   Dr Karen Shannon    Current Medications: Current Meds  Medication Sig   ascorbic acid (VITAMIN C) 500 MG tablet Take 500 mg by mouth daily.   aspirin 81 MG chewable tablet Chew 81 mg by mouth daily.   atorvastatin (LIPITOR) 80 MG tablet Take 1 tablet by mouth once daily   calcium carbonate (SUPER CALCIUM) 1500 (600 Ca) MG TABS tablet Take 1 tablet by mouth daily.   Cholecalciferol 25 MCG (1000 UT) capsule Take 1,000 Units by mouth daily.   dicyclomine (BENTYL) 10 MG capsule Take 10 mg by mouth as needed for spasms (IBS).   diphenoxylate-atropine (LOMOTIL) 2.5-0.025 MG tablet Take 1 tablet by mouth as needed for diarrhea or loose stools.   donepezil (ARICEPT) 10 MG tablet Take 10 mg by mouth at bedtime.   dorzolamide (TRUSOPT) 2 % ophthalmic solution Place 1 drop into the right eye 2 (two) times daily.   ergocalciferol (VITAMIN D2) 50000 units capsule Take 50,000 Units by mouth once a week.   ezetimibe (ZETIA) 10 MG  tablet Take 1 tablet (10 mg total) by mouth daily.   furosemide (LASIX) 20 MG tablet Take 20 mg by mouth daily.   HYDROcodone-acetaminophen (NORCO) 7.5-325 MG tablet Take 1 tablet by mouth 3 (three) times daily.   memantine (NAMENDA) 5 MG tablet Take 5 mg by mouth 2 (two) times daily.   metoprolol succinate (TOPROL-XL) 25 MG 24 hr tablet TAKE 1 TABLET BY MOUTH EVERY DAY   montelukast (SINGULAIR) 10 MG tablet Take 10 mg by mouth at bedtime.   neomycin-polymyxin b-dexamethasone (MAXITROL) 3.5-10000-0.1 OINT    ondansetron (ZOFRAN) 4 MG tablet Take 4 mg by mouth every 8 (eight) hours as needed for nausea or vomiting.   solifenacin (VESICARE) 10 MG tablet Take 10 mg by mouth daily.   tamsulosin (FLOMAX) 0.4 MG CAPS capsule Take 0.4 mg by mouth daily after supper.   tiZANidine (ZANAFLEX) 4 MG tablet Take 4 mg by mouth every 8 (eight) hours as  needed for muscle spasms.   vitamin B-12 (CYANOCOBALAMIN) 1000 MCG tablet Take 1,000 mcg by mouth daily.     Allergies:   Clarithromycin, Prednisone, Sertraline, Sertraline hcl, and Duloxetine hcl   Social History   Socioeconomic History   Marital status: Single    Spouse name: Not on file   Number of children: 0   Years of education: 16   Highest education level: Bachelor's degree (e.g., BA, AB, BS)  Occupational History   Occupation: Retired  Tobacco Use   Smoking status: Never   Smokeless tobacco: Never  Vaping Use   Vaping status: Never Used  Substance and Sexual Activity   Alcohol use: Not Currently   Drug use: Never   Sexual activity: Not Currently  Other Topics Concern   Not on file  Social History Narrative   Pt is single she lives alone has no children   Right handed   Social Determinants of Health   Financial Resource Strain: Not on file  Food Insecurity: Not on file  Transportation Needs: Not on file  Physical Activity: Not on file  Stress: Not on file  Social Connections: Not on file     Family History: The patient's  family history includes Breast cancer in her maternal aunt; Breast cancer (age of onset: 84) in an other family member; Emphysema in her father; Liver cancer in her maternal uncle; Lung cancer in her maternal uncle; Lymphoma (age of onset: 61) in her mother; Prostate cancer in an other family member; Stomach cancer in her paternal uncle.  ROS:   Please see the history of present illness.     All other systems reviewed and are negative.  EKGs/Labs/Other Studies Reviewed:    The following studies were reviewed today: Cardiac Studies & Procedures     STRESS TESTS  MYOCARDIAL PERFUSION IMAGING 04/05/2019  Narrative  The left ventricular ejection fraction is hyperdynamic (>65%).  Nuclear stress EF: 66%.  There was no ST segment deviation noted during stress.  Defect 1: There is a defect present in the basal inferior and mid inferior location. Suggests diaphragmatic attenuation.  The study is normal.  This is a low risk study.   ECHOCARDIOGRAM  ECHOCARDIOGRAM COMPLETE 11/01/2021  Narrative ECHOCARDIOGRAM REPORT    Patient Name:   Viviene ANN Delo Date of Exam: 11/01/2021 Medical Rec #:  [id number]            Height:       65.5 in Accession #:    [id number]           Weight:       188.4 lb Date of Birth:  1950-08-19             BSA:          1.940 m Patient Age:    71 years             BP:           124/78 mmHg Patient Gender: F                    HR:           65 bpm. Exam Location:  [facility]  Procedure: 2D Echo, Cardiac Doppler, Color Doppler and Strain Analysis  Indications:    Ventricular tachycardia (HCC) [I47.20 (ICD-10-CM)]; Peripheral vascular disease, unspecified (HCC) up to 69% stenosis on the lef internal carotic artery based on cardiac ultrasounds from June 2022 t [I73.9 (ICD-10-CM)]; Lumbar spondylosis C978821 (ICD-10-CM)]; Palpitations [R00.2 (ICD-10-CM)];  Late effect of cerebrovascular accident (CVA) right basal ganglion noted on MRI in June 2022 felt to  be small vessel disease but [I69.30 (ICD-10-CM)]; Mitral valve prolapse [I34.1 (ICD-10-CM)]  History:        Patient has prior history of Echocardiogram examinations, most recent 07/23/2020. TIA and COPD, Aortic Valve Disease; Signs/Symptoms:Dyspnea.  Sonographer:    Margreta Journey RDCS Referring Phys: [id number] ROBERT J KRASOWSKI  IMPRESSIONS   1. GLS -15.7. Left ventricular ejection fraction, by estimation, is 55 to 60%. The left ventricle has normal function. The left ventricle has no regional wall motion abnormalities. Left ventricular diastolic parameters are consistent with Grade II diastolic dysfunction (pseudonormalization). 2. Right ventricular systolic function is normal. The right ventricular size is normal. There is normal pulmonary artery systolic pressure. 3. The mitral valve is normal in structure. No evidence of mitral valve regurgitation. No evidence of mitral stenosis. 4. The aortic valve is normal in structure. There is mild thickening of the aortic valve. Aortic valve regurgitation is not visualized. Mild aortic valve stenosis. Aortic valve mean gradient measures 16.0 mmHg. 5. The inferior vena cava is normal in size with greater than 50% respiratory variability, suggesting right atrial pressure of 3 mmHg.  FINDINGS Left Ventricle: GLS -15.7. Left ventricular ejection fraction, by estimation, is 55 to 60%. The left ventricle has normal function. The left ventricle has no regional wall motion abnormalities. The left ventricular internal cavity size was normal in size. There is no left ventricular hypertrophy. Left ventricular diastolic parameters are consistent with Grade II diastolic dysfunction (pseudonormalization).  Right Ventricle: The right ventricular size is normal. No increase in right ventricular wall thickness. Right ventricular systolic function is normal. There is normal pulmonary artery systolic pressure. The tricuspid regurgitant velocity is 2.28 m/s, and with  an assumed right atrial pressure of 8 mmHg, the estimated right ventricular systolic pressure is 28.8 mmHg.  Left Atrium: Left atrial size was normal in size.  Right Atrium: Right atrial size was normal in size.  Pericardium: There is no evidence of pericardial effusion.  Mitral Valve: The mitral valve is normal in structure. No evidence of mitral valve regurgitation. No evidence of mitral valve stenosis.  Tricuspid Valve: The tricuspid valve is normal in structure. Tricuspid valve regurgitation is mild . No evidence of tricuspid stenosis.  Aortic Valve: The aortic valve is normal in structure. There is mild thickening of the aortic valve. Aortic valve regurgitation is not visualized. Mild aortic stenosis is present. Aortic valve mean gradient measures 16.0 mmHg. Aortic valve peak gradient measures 25.4 mmHg. Aortic valve area, by VTI measures 0.97 cm.  Pulmonic Valve: The pulmonic valve was normal in structure. Pulmonic valve regurgitation is not visualized. No evidence of pulmonic stenosis.  Aorta: The aortic root is normal in size and structure.  Venous: The inferior vena cava is normal in size with greater than 50% respiratory variability, suggesting right atrial pressure of 3 mmHg.  IAS/Shunts: No atrial level shunt detected by color flow Doppler.   LEFT VENTRICLE PLAX 2D LVIDd:         3.00 cm   Diastology LVIDs:         2.20 cm   LV e' medial:    5.77 cm/s LV PW:         1.10 cm   LV E/e' medial:  18.9 LV IVS:        1.10 cm   LV e' lateral:   7.07 cm/s LVOT diam:     1.90 cm  LV E/e' lateral: 15.4 LV SV:         56 LV SV Index:   29 LVOT Area:     2.84 cm   RIGHT VENTRICLE             IVC RV Basal diam:  3.00 cm     IVC diam: 2.50 cm RV S prime:     11.20 cm/s TAPSE (M-mode): 2.3 cm  LEFT ATRIUM             Index        RIGHT ATRIUM           Index LA diam:        2.60 cm 1.34 cm/m   RA Area:     14.50 cm LA Vol (A2C):   53.2 ml 27.43 ml/m  RA Volume:   33.00  ml  17.01 ml/m LA Vol (A4C):   38.7 ml 19.95 ml/m LA Biplane Vol: 45.7 ml 23.56 ml/m AORTIC VALVE AV Area (Vmax):    1.02 cm AV Area (Vmean):   0.87 cm AV Area (VTI):     0.97 cm AV Vmax:           252.00 cm/s AV Vmean:          192.000 cm/s AV VTI:            0.579 m AV Peak Grad:      25.4 mmHg AV Mean Grad:      16.0 mmHg LVOT Vmax:         90.50 cm/s LVOT Vmean:        58.900 cm/s LVOT VTI:          0.199 m LVOT/AV VTI ratio: 0.34  AORTA Ao Root diam: 3.40 cm Ao Asc diam:  3.40 cm  MITRAL VALVE                TRICUSPID VALVE MV Area (PHT): 3.99 cm     TR Peak grad:   20.8 mmHg MV Decel Time: 190 msec     TR Vmax:        228.00 cm/s MV E velocity: 109.00 cm/s MV A velocity: 120.00 cm/s  SHUNTS MV E/A ratio:  0.91         Systemic VTI:  0.20 m Systemic Diam: 1.90 cm  Karen Balsam MD Electronically signed by Karen Balsam MD Signature Date/Time: 11/02/2021/12:03:57 PM    Final    MONITORS  LONG TERM MONITOR (3-14 DAYS) 02/17/2019  Narrative Karen Shannon, DOB [birth year], MRN [id number]  HOLTER MONITOR REPORT:    Date of test:                 01/22/2019 Duration of test:           13 Indication:                    Palpitations Ordering physician:  Georgeanna Lea, MD Referring physician:  Georgeanna Lea, MD   Baseline rhythm: Sinus  Minimum heart rate: 52 BPM.  Average heart rate: 78 BPM.  Maximal heart rate 869 BPM.  Atrial arrhythmia: Total of 11 narrow complex tachycardia fastest episode 16 beats at 154 bpm  Ventricular arrhythmia: 3 episodes of ventricular tachycardia longest and fastest 12 beats at rate of 169  Conduction abnormality: None  Symptoms: Multiple trigger events for PVCs   Conclusion: Ventricular tachycardia: Total 3 episodes.  Longest 12 beats.  Asymptomatic. 11 narrow complex tachycardias Symptomatic PVCs  Interpreting  cardiologist: Karen Balsam, MD Date: 02/20/2019 9:07 PM            EKG:  EKG  is not ordered today.    Recent Labs: 10/21/2021: NT-Pro BNP 257 05/28/2022: BUN 17; Creatinine, Ser 0.80; Potassium 4.4; Sodium 142 07/30/2022: ALT 23  Recent Lipid Panel    Component Value Date/Time   CHOL 127 07/30/2022 0941   TRIG 49 07/30/2022 0941   HDL 53 07/30/2022 0941   CHOLHDL 2.4 07/30/2022 0941   LDLCALC 63 07/30/2022 0941   LDLDIRECT 89 10/29/2020 1038     Risk Assessment/Calculations:                Physical Exam:    VS:  BP 118/80 (BP Location: Right Arm, Patient Position: Sitting, Cuff Size: Normal)   Pulse 66   Ht 5\' 6"  (1.676 m)   Wt 191 lb 3.2 oz (86.7 kg)   SpO2 100%   BMI 30.86 kg/m     Wt Readings from Last 3 Encounters:  09/01/22 191 lb 3.2 oz (86.7 kg)  06/20/22 190 lb 11.2 oz (86.5 kg)  05/28/22 194 lb (88 kg)     GEN:  Well nourished, well developed in no acute distress HEENT: Normal NECK: No JVD; No carotid bruits LYMPHATICS: No lymphadenopathy CARDIAC: RRR, no murmurs, rubs, gallops RESPIRATORY:  Clear to auscultation without rales, wheezing or rhonchi  ABDOMEN: Soft, non-tender, non-distended MUSCULOSKELETAL:  No edema; No deformity  SKIN: Warm and dry NEUROLOGIC:  Alert and oriented x 3 PSYCHIATRIC:  Normal affect   ASSESSMENT:    No diagnosis found.  PLAN:    In order of problems listed above:  Precordial pain/non-obstructive CAD-most recent stress test in 2021 was unrevealing for ischemia.  We arranged for coronary CTA which revealed nonobstructive CAD.  Continue aspirin 81 mg daily, continue metoprolol 25 mg daily.  She has had no further incidents of chest pain.  Ventricular tachycardia/palpitations -she wore a Holter monitor in 2021 which revealed episodes of ventricular tachycardia, she did not have any symptoms associated with this.  Palpitations have been quiescent for her.  Continue metoprolol 25 mg daily.  Carotid artery stenosis -most recent carotid duplex on 04/15/2022 revealed no stenosis right ICA, 40 to 59%  stenosis left ICA.  Followed by vascular.  Continue atorvastatin 80 mg daily.  HLD-most recent LDL on 07/30/22 was 63, continue Lipitor 80 mg daily.    Disposition- return in 6 months.    Medication Adjustments/Labs and Tests Ordered: Current medicines are reviewed at length with the patient today.  Concerns regarding medicines are outlined above.  No orders of the defined types were placed in this encounter.  No orders of the defined types were placed in this encounter.   Patient Instructions  Medication Instructions:  Your physician recommends that you continue on your current medications as directed. Please refer to the Current Medication list given to you today.  *If you need a refill on your cardiac medications before your next appointment, please call your pharmacy*   Lab Work: NONE If you have labs (blood work) drawn today and your tests are completely normal, you will receive your results only by: MyChart Message (if you have MyChart) OR A paper copy in the mail If you have any lab test that is abnormal or we need to change your treatment, we will call you to review the results.   Testing/Procedures: NONE   Follow-Up: At [facility], you and your health needs are our  priority.  As part of our continuing mission to provide you with exceptional heart care, we have created designated Provider Care Teams.  These Care Teams include your primary Cardiologist (physician) and Advanced Practice Providers (APPs -  Physician Assistants and Nurse Practitioners) who all work together to provide you with the care you need, when you need it.  We recommend signing up for the patient portal called "MyChart".  Sign up information is provided on this After Visit Summary.  MyChart is used to connect with patients for Virtual Visits (Telemedicine).  Patients are able to view lab/test results, encounter notes, upcoming appointments, etc.  Non-urgent messages can be sent to your  provider as well.   To learn more about what you can do with MyChart, go to [email or web address].    Your next appointment:   6 month(s)  Provider:   Gypsy Balsam, MD    Other Instructions     Signed, Flossie Dibble, NP  09/01/2022 10:40 AM    [facility]

## 2022-09-01 ENCOUNTER — Encounter: Payer: Self-pay | Admitting: Cardiology

## 2022-09-01 ENCOUNTER — Ambulatory Visit: Payer: Medicare Other | Attending: Cardiology | Admitting: Cardiology

## 2022-09-01 VITALS — BP 118/80 | HR 66 | Ht 66.0 in | Wt 191.2 lb

## 2022-09-01 DIAGNOSIS — R351 Nocturia: Secondary | ICD-10-CM | POA: Diagnosis not present

## 2022-09-01 DIAGNOSIS — E785 Hyperlipidemia, unspecified: Secondary | ICD-10-CM

## 2022-09-01 DIAGNOSIS — I472 Ventricular tachycardia, unspecified: Secondary | ICD-10-CM | POA: Diagnosis present

## 2022-09-01 DIAGNOSIS — N135 Crossing vessel and stricture of ureter without hydronephrosis: Secondary | ICD-10-CM | POA: Diagnosis not present

## 2022-09-01 DIAGNOSIS — R002 Palpitations: Secondary | ICD-10-CM | POA: Diagnosis present

## 2022-09-01 DIAGNOSIS — N390 Urinary tract infection, site not specified: Secondary | ICD-10-CM | POA: Diagnosis not present

## 2022-09-01 DIAGNOSIS — I2510 Atherosclerotic heart disease of native coronary artery without angina pectoris: Secondary | ICD-10-CM

## 2022-09-01 DIAGNOSIS — R072 Precordial pain: Secondary | ICD-10-CM

## 2022-09-01 DIAGNOSIS — N3281 Overactive bladder: Secondary | ICD-10-CM | POA: Diagnosis not present

## 2022-09-01 NOTE — Patient Instructions (Signed)
Medication Instructions:  Your physician recommends that you continue on your current medications as directed. Please refer to the Current Medication list given to you today.  *If you need a refill on your cardiac medications before your next appointment, please call your pharmacy*   Lab Work: NONE If you have labs (blood work) drawn today and your tests are completely normal, you will receive your results only by: MyChart Message (if you have MyChart) OR A paper copy in the mail If you have any lab test that is abnormal or we need to change your treatment, we will call you to review the results.   Testing/Procedures: NONE   Follow-Up: At [facility], you and your health needs are our priority.  As part of our continuing mission to provide you with exceptional heart care, we have created designated Provider Care Teams.  These Care Teams include your primary Cardiologist (physician) and Advanced Practice Providers (APPs -  Physician Assistants and Nurse Practitioners) who all work together to provide you with the care you need, when you need it.  We recommend signing up for the patient portal called "MyChart".  Sign up information is provided on this After Visit Summary.  MyChart is used to connect with patients for Virtual Visits (Telemedicine).  Patients are able to view lab/test results, encounter notes, upcoming appointments, etc.  Non-urgent messages can be sent to your provider as well.   To learn more about what you can do with MyChart, go to [email or web address].    Your next appointment:   6 month(s)  Provider:   Robert Krasowski, MD    Other Instructions   

## 2022-09-06 DIAGNOSIS — E785 Hyperlipidemia, unspecified: Secondary | ICD-10-CM | POA: Diagnosis not present

## 2022-09-06 DIAGNOSIS — R002 Palpitations: Secondary | ICD-10-CM | POA: Diagnosis not present

## 2022-09-06 DIAGNOSIS — M19072 Primary osteoarthritis, left ankle and foot: Secondary | ICD-10-CM | POA: Diagnosis not present

## 2022-09-06 DIAGNOSIS — J4540 Moderate persistent asthma, uncomplicated: Secondary | ICD-10-CM | POA: Diagnosis not present

## 2022-09-06 DIAGNOSIS — E0590 Thyrotoxicosis, unspecified without thyrotoxic crisis or storm: Secondary | ICD-10-CM | POA: Diagnosis not present

## 2022-09-06 DIAGNOSIS — E559 Vitamin D deficiency, unspecified: Secondary | ICD-10-CM | POA: Diagnosis not present

## 2022-09-06 DIAGNOSIS — M160 Bilateral primary osteoarthritis of hip: Secondary | ICD-10-CM | POA: Diagnosis not present

## 2022-09-06 DIAGNOSIS — Z86718 Personal history of other venous thrombosis and embolism: Secondary | ICD-10-CM | POA: Diagnosis not present

## 2022-09-06 DIAGNOSIS — N3281 Overactive bladder: Secondary | ICD-10-CM | POA: Diagnosis not present

## 2022-09-06 DIAGNOSIS — M19071 Primary osteoarthritis, right ankle and foot: Secondary | ICD-10-CM | POA: Diagnosis not present

## 2022-09-09 DIAGNOSIS — M4316 Spondylolisthesis, lumbar region: Secondary | ICD-10-CM | POA: Diagnosis not present

## 2022-09-09 DIAGNOSIS — M5416 Radiculopathy, lumbar region: Secondary | ICD-10-CM | POA: Diagnosis not present

## 2022-09-16 DIAGNOSIS — L821 Other seborrheic keratosis: Secondary | ICD-10-CM | POA: Diagnosis not present

## 2022-09-16 DIAGNOSIS — L570 Actinic keratosis: Secondary | ICD-10-CM | POA: Diagnosis not present

## 2022-10-02 DIAGNOSIS — N390 Urinary tract infection, site not specified: Secondary | ICD-10-CM | POA: Diagnosis not present

## 2022-10-06 NOTE — Progress Notes (Unsigned)
NEUROLOGY FOLLOW UP OFFICE NOTE  Karen Shannon [id number]  Assessment/Plan:   Mild neurocognitive disorder, unclear etiology but does not meet criteria for dementia.  While pattern is suggestive of secondary causes not neurodegenerative.  Chronic pain is likely a factor.  I really do not suspect vascular etiology as her brain scan does not reveal any significant cerebrovascular disease.  However, it does not appear to be consistent with underlying neurodegenerative disease.  As the pattern of deficits on her performance is not consistent with potential Alzheimer's I do not think medication is indicated.  She wishes to remain on donepezil as prescribed by her PCP.  At the very least, memantine is definitely not indicated as she does not  have dementia.   Bilateral ptosis.   1  Repeat neuropsychological evaluation in one year. 2  Check Myasthenia panel with MUSK antibody reflex 3  Follow up after repeat neuropsychological evaluation.  Subjective:  Karen Shannon is a 72 year old female who follows up for migraines.   UPDATE: Current medications:  Donepezil 10mg  at bedtime, ASA 325mg  daily, atorvastatin 40mg , Lasix   Memory has been stable.  No concerns.  She has history of bilateral ptosis.  She had surgery in 2019.  She had a recurrence this past year requiring a second surgery.  Denies double vision, trouble swallowing or generalized weakness.     HISTORY: She was diagnosed with "confusional migraines" in the 1970s, where she has severe migraine headache associated with brief confusion lasting 30 minutes.  She typically has a couple a year.  Initially, she was on nortriptyline for a year but stopped due to side effects.   On the night of 10/19/2018, she woke up with stinging paresthesias involving the left side of her face from forehead, over her left eye and down to the left side of her chin.  She also noted moderate left frontotemporal throbbing pain as well as left otalgia,  decreased hearing in left ear, blurred vision in left eye, nausea and noted that she was drooling from the corner of both sides of her mouth.  She got up and felt dizzy and unsteady on her feet.  No slurred speech, speech disturbance, or new unilateral facial droop or extremity weakness.  No fevers.  She was tested for COVID, which was negative.  No specific triggers.  She had an MRI of the brain without contrast on 10/29/2018 which was unremarkable.  Her PCP thought she may have had a migraine, so she was prescribed topiramate.  She stopped after one dose because it didn't make her feel well.  Due to the left otalgia and trouble hearing, she saw ENT.  Exam was negative.  She had mild residual symptoms everyday up until for the next 2 weeks.  During that time, she did have one of her typical migraines.  She noted confusion off and on over those 3 weeks as well.  Typical migraines not associated with left facial paresthesias, drooling and dizziness. Carotid doppler from 11/24/2018 showed only minimal heterogenous plaque with no hemodynamically significant stenosis. In November 2020, she was driving to the grocery store and then she started having a "fuzzy feeling" on her forehead and she suddenly became confused and was disoriented.  She didn't know where she was.  She had to ask her passenger where to go.  It lasted about 5 minutes.  Since then, she has had residual symptoms.  She reported intermittent slurred speech.  She has short term memory problems.  She  states this is different than her prior migraines with confusion.  She has left sided neck pain and ear and her orthopedist started her on Mobic which was ineffective.  Left eye is still blurred.  Sed rate was 24.  MRI of brain w wo and MRA of head were normal.  Echocardiogram from 01/21/2019 showed EF 55-60% with no cardiac source of embolus.  Two week holter monitor from 12/12 to 02/05/2019 showed no a fib.  She was subsequently found to have a DVT so ASA was  switched to Eliquis in the meantime.  She saw Karen Shannon at [facility] on 01/10/2019 was unremarkable.  Bilateral ptosis was noted.  She was referred for surgery but is on hold while on anticoagulation.  Neuropsychological testing performed by Karen Shannon on 01/24/2019 was normal and her subjective memory complaints was felt to be secondary to depression and anxiety.  Around May 2022, she fell and hit her head.  She complained of worsening memory problems.  She started having memory problems again.  One time, she was having trouble driving to a doctor's appointment.  She couldn't remember the exit.  She usually uses a GPS but didn't that day because she goes to that office frequently.  MRI of brain on 07/19/2020 showed incidental chronic lacunar infarct within the right basal ganglia new since prior MRI from 10/24/2018 but no acute or subacute findings.  Carotid ultrasound on 08/08/2020 showed bilateral heterogenous and calcified plaque within the ICAs, no hemodynamically significant stenosis on the right but 50-69% on the left.  She was subsequently diagnosed with Alzheimer's disease by her PCP and started on donepezil and memantine.  She thinks it has helped.  Has OSA and recently got a new CPAP.  She underwent repeat neuropsychological evaluation by Karen Shannon on 03/20/2022 which revealed isolated weakness across a semantic fluency task and performance variability across encoding and delayed retrieval aspects of memory meeting criteria for mild neurocognitive disorder with slight decline compared to prior testing from December 2020 but does not meet criteria for dementia.  Etiology unclear and can be attributed to medical co-morbidities including cerebrovascular disease or chronic pain.   She does have chronic neck pain with cervical spondylosis, which is treated by orthopedics.  MRI of cervical spine from 03/27/2018 showed degenerative cervical spondylosis with edematous arthritis at  C1-2 on right and C3-4 on the left but no significant foraminal or spinal stenosis.  She says it has gradually gotten worse over the past few months.  MRI of lumbar spine from same day showed scoliosis as well as multilevel degenerative changes including foraminal narrowing on left that may be affecting left L3 nerve root and right foraminal narrowing at L4-5 and L5-S1, possibly affecting right L5 nerve.   She has history of mitral valve prolapse with ventricular tachycardia which is monitored by cardiology.  Workup including TTE and stress test were negative.     Past migraine preventatives:  Nortriptyline, topiramate  PAST MEDICAL HISTORY: Past Medical History:  Diagnosis Date   Acute deep vein thrombosis (DVT) of left peroneal vein 04/11/2019   Aortic stenosis 04/12/2021   Basal cell carcinoma 07/25/2021   Cervical spondylosis 03/18/2018   Chronic bilateral low back pain without sciatica 03/18/2018   Chronic foot pain, left 03/14/2019   Possible fracture base 2nd metatarsal   Chronic neck pain 03/18/2018   COPD (chronic obstructive pulmonary disease)    DDD (degenerative disc disease), cervical 03/18/2018   Displaced fracture of fifth metatarsal  bone, right foot 03/2020   Dyspnea on exertion 01/27/2018   Facet arthritis, degenerative, lumbar spine 03/18/2018   Family history of malignant neoplasm of breast    Generalized anxiety disorder 01/24/2019   Possible history of panic attacks   Genetic testing 11/14/2020   Negative genetic testing on the BreastNext panel.  PALB2 p.L100F VUS.  The BreastNext gene panel offered by [facility] includes sequencing and rearrangement analysis for the following 17 genes:  ATM, BARD1, BRCA1, BRCA2, BRIP1, CDH1, CHEK2, MRE11A, MUTYH, NBN, NF1, PALB2, PTEN, RAD50, RAD51C, RAD51D, and TP53.   The report date was 11/18/2013.     UPDATE: PALB2 p.L100F has been reclassified to Likely B   Greater trochanteric bursitis of both hips 04/10/2018   Heart murmur  01/27/2018   Left carotid artery stenosis 07/03/2022   Left leg swelling 02/23/2019   Lumbar spondylosis 03/18/2018   Malignant neoplasm of central portion of left female breast    S/p bilateral mastectomy   Mild cognitive impairment of uncertain or unknown etiology 03/20/2022   Mitral valve prolapse 01/27/2018   Neuropathy 06/17/2021   Obstructive sleep apnea    Variable CPAP use.   Osteopenia after menopause 12/21/2020   Other idiopathic scoliosis, lumbar region 03/18/2018   Pain of left calf 03/14/2019   Palpitations 01/25/2018   Peripheral vascular disease, unspecified 10/29/2020   up to 69% stenosis on the lef internal carotic artery based on cardiac ultrasounds from June 2022   Pes planus of left foot 02/23/2019   Pinched nerve 02/10/2021   Sprain of tarsometatarsal ligament of right foot 07/14/2019   Stasis dermatitis, acute 10/03/2019   Tenosynovitis of left ankle 04/11/2019   TIA (transient ischemic attack) 01/17/2019   Urge incontinence 07/28/2019   Varicose veins of left lower extremity    Ventricular tachycardia 02/28/2019    MEDICATIONS: Current Outpatient Medications on File Prior to Visit  Medication Sig Dispense Refill   ascorbic acid (VITAMIN C) 500 MG tablet Take 500 mg by mouth daily.     aspirin 81 MG chewable tablet Chew 81 mg by mouth daily.     atorvastatin (LIPITOR) 80 MG tablet Take 1 tablet by mouth once daily 90 tablet 0   calcium carbonate (SUPER CALCIUM) 1500 (600 Ca) MG TABS tablet Take 1 tablet by mouth daily.     Cholecalciferol 25 MCG (1000 UT) capsule Take 1,000 Units by mouth daily.     dicyclomine (BENTYL) 10 MG capsule Take 10 mg by mouth as needed for spasms (IBS).     diphenoxylate-atropine (LOMOTIL) 2.5-0.025 MG tablet Take 1 tablet by mouth as needed for diarrhea or loose stools.     donepezil (ARICEPT) 10 MG tablet Take 10 mg by mouth at bedtime.     dorzolamide (TRUSOPT) 2 % ophthalmic solution Place 1 drop into the right eye 2 (two)  times daily.     ergocalciferol (VITAMIN D2) 50000 units capsule Take 50,000 Units by mouth once a week.     ezetimibe (ZETIA) 10 MG tablet Take 1 tablet (10 mg total) by mouth daily. 90 tablet 3   furosemide (LASIX) 20 MG tablet Take 20 mg by mouth daily.     HYDROcodone-acetaminophen (NORCO) 7.5-325 MG tablet Take 1 tablet by mouth 3 (three) times daily.     memantine (NAMENDA) 5 MG tablet Take 5 mg by mouth 2 (two) times daily.     metoprolol succinate (TOPROL-XL) 25 MG 24 hr tablet TAKE 1 TABLET BY MOUTH EVERY DAY 90 tablet 2  montelukast (SINGULAIR) 10 MG tablet Take 10 mg by mouth at bedtime.     neomycin-polymyxin b-dexamethasone (MAXITROL) 3.5-10000-0.1 OINT      ondansetron (ZOFRAN) 4 MG tablet Take 4 mg by mouth every 8 (eight) hours as needed for nausea or vomiting.     solifenacin (VESICARE) 10 MG tablet Take 10 mg by mouth daily.     tamsulosin (FLOMAX) 0.4 MG CAPS capsule Take 0.4 mg by mouth daily after supper.     tiZANidine (ZANAFLEX) 4 MG tablet Take 4 mg by mouth every 8 (eight) hours as needed for muscle spasms.     vitamin B-12 (CYANOCOBALAMIN) 1000 MCG tablet Take 1,000 mcg by mouth daily.     No current facility-administered medications on file prior to visit.    ALLERGIES: Allergies  Allergen Reactions   Clarithromycin Diarrhea and Other (See Comments)   Prednisone Other (See Comments)    Passed out Other reaction(s): other   Sertraline Diarrhea   Sertraline Hcl Diarrhea   Duloxetine Hcl Itching, Nausea Only, Other (See Comments), Rash and Swelling    FAMILY HISTORY: Family History  Problem Relation Age of Onset   Lymphoma Mother 16       deceased 80   Breast cancer Maternal Aunt        Dx 4s; Deceased 94s   Lung cancer Maternal Uncle    Stomach cancer Paternal Uncle        deceased 58   Liver cancer Maternal Uncle    Prostate cancer Other        3 mat cousins   Breast cancer Other 32       mother's mat half-sister   Emphysema Father        Objective:  Blood pressure 116/78, pulse 64, height 5\' 6"  (1.676 m), weight 192 lb 6.4 oz (87.3 kg), SpO2 96%. General: No acute distress.  Patient appears well-groomed.   Head:  Normocephalic/atraumatic Eyes:  Fundi examined but not visualized Neck: supple, no paraspinal tenderness, full range of motion Heart:  Regular rate and rhythm Lungs:  Clear to auscultation bilaterally Back: No paraspinal tenderness Neurological Exam: alert and oriented.  Speech fluent and not dysarthric, language intact.  CN II-XII intact. Bulk and tone normal, muscle strength 5/5 throughout.  Sensation to light touch intact.  Deep tendon reflexes 2+ throughout, toes downgoing.  Finger to nose testing intact.  Gait normal, Romberg negative.   Karen Millet, DO  CC: Foye Deer, MD

## 2022-10-08 ENCOUNTER — Other Ambulatory Visit (INDEPENDENT_AMBULATORY_CARE_PROVIDER_SITE_OTHER): Payer: Medicare Other

## 2022-10-08 ENCOUNTER — Ambulatory Visit (INDEPENDENT_AMBULATORY_CARE_PROVIDER_SITE_OTHER): Payer: Medicare Other | Admitting: Neurology

## 2022-10-08 VITALS — BP 116/78 | HR 64 | Ht 66.0 in | Wt 192.4 lb

## 2022-10-08 DIAGNOSIS — H02403 Unspecified ptosis of bilateral eyelids: Secondary | ICD-10-CM | POA: Diagnosis not present

## 2022-10-08 DIAGNOSIS — G3184 Mild cognitive impairment, so stated: Secondary | ICD-10-CM | POA: Diagnosis not present

## 2022-10-08 NOTE — Patient Instructions (Addendum)
Check myasthenia panel with MUSK reflex Repeat neuropsychological evaluation in one year. Follow up with me in one year (after repeat neuropsychological evaluation)

## 2022-10-09 ENCOUNTER — Encounter: Payer: Self-pay | Admitting: Neurology

## 2022-10-13 DIAGNOSIS — U071 COVID-19: Secondary | ICD-10-CM | POA: Insufficient documentation

## 2022-10-13 DIAGNOSIS — Z8616 Personal history of COVID-19: Secondary | ICD-10-CM | POA: Insufficient documentation

## 2022-10-13 HISTORY — DX: COVID-19: U07.1

## 2022-10-13 HISTORY — DX: Personal history of COVID-19: Z86.16

## 2022-10-19 ENCOUNTER — Other Ambulatory Visit: Payer: Self-pay | Admitting: Cardiology

## 2022-10-19 LAB — MYASTHENIA GRAVIS PANEL 2
A CHR BINDING ABS: <0.3 nmol/L
ACHR Blocking Abs: <15 %{inhibition} (ref <15)
Acetylchol Modul Ab: <1 %{inhibition}

## 2022-10-21 NOTE — Progress Notes (Signed)
Patient advised.

## 2022-11-04 DIAGNOSIS — M5416 Radiculopathy, lumbar region: Secondary | ICD-10-CM | POA: Diagnosis not present

## 2022-11-14 DIAGNOSIS — B9689 Other specified bacterial agents as the cause of diseases classified elsewhere: Secondary | ICD-10-CM | POA: Diagnosis not present

## 2022-11-14 DIAGNOSIS — J208 Acute bronchitis due to other specified organisms: Secondary | ICD-10-CM | POA: Diagnosis not present

## 2022-11-19 DIAGNOSIS — H02403 Unspecified ptosis of bilateral eyelids: Secondary | ICD-10-CM | POA: Diagnosis not present

## 2022-11-19 DIAGNOSIS — H353131 Nonexudative age-related macular degeneration, bilateral, early dry stage: Secondary | ICD-10-CM | POA: Diagnosis not present

## 2022-11-19 DIAGNOSIS — H25813 Combined forms of age-related cataract, bilateral: Secondary | ICD-10-CM | POA: Diagnosis not present

## 2022-11-19 DIAGNOSIS — H40013 Open angle with borderline findings, low risk, bilateral: Secondary | ICD-10-CM | POA: Diagnosis not present

## 2022-11-25 DIAGNOSIS — R5383 Other fatigue: Secondary | ICD-10-CM | POA: Diagnosis not present

## 2022-11-25 DIAGNOSIS — G4733 Obstructive sleep apnea (adult) (pediatric): Secondary | ICD-10-CM | POA: Diagnosis not present

## 2022-11-25 DIAGNOSIS — J4520 Mild intermittent asthma, uncomplicated: Secondary | ICD-10-CM | POA: Diagnosis not present

## 2022-12-01 DIAGNOSIS — N390 Urinary tract infection, site not specified: Secondary | ICD-10-CM | POA: Diagnosis not present

## 2022-12-01 DIAGNOSIS — R319 Hematuria, unspecified: Secondary | ICD-10-CM | POA: Diagnosis not present

## 2022-12-01 DIAGNOSIS — Z1211 Encounter for screening for malignant neoplasm of colon: Secondary | ICD-10-CM | POA: Diagnosis not present

## 2022-12-04 DIAGNOSIS — Z79899 Other long term (current) drug therapy: Secondary | ICD-10-CM | POA: Diagnosis not present

## 2022-12-04 DIAGNOSIS — N390 Urinary tract infection, site not specified: Secondary | ICD-10-CM | POA: Diagnosis not present

## 2022-12-05 DIAGNOSIS — N390 Urinary tract infection, site not specified: Secondary | ICD-10-CM | POA: Diagnosis not present

## 2022-12-05 DIAGNOSIS — Z79899 Other long term (current) drug therapy: Secondary | ICD-10-CM | POA: Diagnosis not present

## 2022-12-06 DIAGNOSIS — Z79899 Other long term (current) drug therapy: Secondary | ICD-10-CM | POA: Diagnosis not present

## 2022-12-06 DIAGNOSIS — N390 Urinary tract infection, site not specified: Secondary | ICD-10-CM | POA: Diagnosis not present

## 2022-12-07 DIAGNOSIS — N390 Urinary tract infection, site not specified: Secondary | ICD-10-CM | POA: Diagnosis not present

## 2022-12-07 DIAGNOSIS — Z79899 Other long term (current) drug therapy: Secondary | ICD-10-CM | POA: Diagnosis not present

## 2022-12-08 DIAGNOSIS — Z79899 Other long term (current) drug therapy: Secondary | ICD-10-CM | POA: Diagnosis not present

## 2022-12-08 DIAGNOSIS — N390 Urinary tract infection, site not specified: Secondary | ICD-10-CM | POA: Diagnosis not present

## 2022-12-09 DIAGNOSIS — N390 Urinary tract infection, site not specified: Secondary | ICD-10-CM | POA: Diagnosis not present

## 2022-12-09 DIAGNOSIS — Z79899 Other long term (current) drug therapy: Secondary | ICD-10-CM | POA: Diagnosis not present

## 2022-12-10 DIAGNOSIS — Z79899 Other long term (current) drug therapy: Secondary | ICD-10-CM | POA: Diagnosis not present

## 2022-12-10 DIAGNOSIS — N390 Urinary tract infection, site not specified: Secondary | ICD-10-CM | POA: Diagnosis not present

## 2022-12-13 DIAGNOSIS — M19072 Primary osteoarthritis, left ankle and foot: Secondary | ICD-10-CM | POA: Diagnosis not present

## 2022-12-13 DIAGNOSIS — Z86718 Personal history of other venous thrombosis and embolism: Secondary | ICD-10-CM | POA: Diagnosis not present

## 2022-12-13 DIAGNOSIS — J4540 Moderate persistent asthma, uncomplicated: Secondary | ICD-10-CM | POA: Diagnosis not present

## 2022-12-13 DIAGNOSIS — E559 Vitamin D deficiency, unspecified: Secondary | ICD-10-CM | POA: Diagnosis not present

## 2022-12-13 DIAGNOSIS — E0590 Thyrotoxicosis, unspecified without thyrotoxic crisis or storm: Secondary | ICD-10-CM | POA: Diagnosis not present

## 2022-12-13 DIAGNOSIS — M19071 Primary osteoarthritis, right ankle and foot: Secondary | ICD-10-CM | POA: Diagnosis not present

## 2022-12-13 DIAGNOSIS — E785 Hyperlipidemia, unspecified: Secondary | ICD-10-CM | POA: Diagnosis not present

## 2022-12-13 DIAGNOSIS — N3281 Overactive bladder: Secondary | ICD-10-CM | POA: Diagnosis not present

## 2022-12-13 DIAGNOSIS — Z6834 Body mass index (BMI) 34.0-34.9, adult: Secondary | ICD-10-CM | POA: Diagnosis not present

## 2022-12-13 DIAGNOSIS — R002 Palpitations: Secondary | ICD-10-CM | POA: Diagnosis not present

## 2022-12-13 DIAGNOSIS — M160 Bilateral primary osteoarthritis of hip: Secondary | ICD-10-CM | POA: Diagnosis not present

## 2022-12-15 DIAGNOSIS — N3021 Other chronic cystitis with hematuria: Secondary | ICD-10-CM | POA: Diagnosis not present

## 2022-12-15 DIAGNOSIS — N3941 Urge incontinence: Secondary | ICD-10-CM | POA: Diagnosis not present

## 2022-12-15 DIAGNOSIS — N952 Postmenopausal atrophic vaginitis: Secondary | ICD-10-CM | POA: Diagnosis not present

## 2022-12-16 LAB — LAB REPORT - SCANNED
EGFR (Non-African Amer.): 84.3
EGFR: 74.6

## 2022-12-19 DIAGNOSIS — R293 Abnormal posture: Secondary | ICD-10-CM | POA: Diagnosis not present

## 2022-12-19 DIAGNOSIS — Z23 Encounter for immunization: Secondary | ICD-10-CM | POA: Diagnosis not present

## 2022-12-19 DIAGNOSIS — M6281 Muscle weakness (generalized): Secondary | ICD-10-CM | POA: Diagnosis not present

## 2022-12-19 DIAGNOSIS — M5416 Radiculopathy, lumbar region: Secondary | ICD-10-CM | POA: Diagnosis not present

## 2022-12-23 ENCOUNTER — Inpatient Hospital Stay: Payer: Medicare Other | Admitting: Oncology

## 2022-12-29 DIAGNOSIS — M5416 Radiculopathy, lumbar region: Secondary | ICD-10-CM | POA: Diagnosis not present

## 2022-12-29 DIAGNOSIS — R293 Abnormal posture: Secondary | ICD-10-CM | POA: Diagnosis not present

## 2022-12-29 DIAGNOSIS — M6281 Muscle weakness (generalized): Secondary | ICD-10-CM | POA: Diagnosis not present

## 2023-01-02 DIAGNOSIS — M6281 Muscle weakness (generalized): Secondary | ICD-10-CM | POA: Diagnosis not present

## 2023-01-02 DIAGNOSIS — R293 Abnormal posture: Secondary | ICD-10-CM | POA: Diagnosis not present

## 2023-01-02 DIAGNOSIS — M5416 Radiculopathy, lumbar region: Secondary | ICD-10-CM | POA: Diagnosis not present

## 2023-01-05 DIAGNOSIS — M6281 Muscle weakness (generalized): Secondary | ICD-10-CM | POA: Diagnosis not present

## 2023-01-05 DIAGNOSIS — M5416 Radiculopathy, lumbar region: Secondary | ICD-10-CM | POA: Diagnosis not present

## 2023-01-05 DIAGNOSIS — R293 Abnormal posture: Secondary | ICD-10-CM | POA: Diagnosis not present

## 2023-01-06 DIAGNOSIS — N3021 Other chronic cystitis with hematuria: Secondary | ICD-10-CM | POA: Diagnosis not present

## 2023-01-19 DIAGNOSIS — M5416 Radiculopathy, lumbar region: Secondary | ICD-10-CM | POA: Diagnosis not present

## 2023-01-19 DIAGNOSIS — S9032XA Contusion of left foot, initial encounter: Secondary | ICD-10-CM | POA: Diagnosis not present

## 2023-01-19 DIAGNOSIS — R0781 Pleurodynia: Secondary | ICD-10-CM | POA: Diagnosis not present

## 2023-01-19 DIAGNOSIS — R293 Abnormal posture: Secondary | ICD-10-CM | POA: Diagnosis not present

## 2023-01-19 DIAGNOSIS — N390 Urinary tract infection, site not specified: Secondary | ICD-10-CM | POA: Diagnosis not present

## 2023-01-19 DIAGNOSIS — M6281 Muscle weakness (generalized): Secondary | ICD-10-CM | POA: Diagnosis not present

## 2023-01-20 DIAGNOSIS — R5383 Other fatigue: Secondary | ICD-10-CM | POA: Diagnosis not present

## 2023-01-20 DIAGNOSIS — G4733 Obstructive sleep apnea (adult) (pediatric): Secondary | ICD-10-CM | POA: Diagnosis not present

## 2023-01-20 DIAGNOSIS — J4520 Mild intermittent asthma, uncomplicated: Secondary | ICD-10-CM | POA: Diagnosis not present

## 2023-01-21 ENCOUNTER — Inpatient Hospital Stay: Payer: Medicare Other | Attending: Hematology and Oncology | Admitting: Hematology and Oncology

## 2023-01-21 ENCOUNTER — Encounter: Payer: Self-pay | Admitting: Hematology and Oncology

## 2023-01-21 ENCOUNTER — Telehealth: Payer: Self-pay | Admitting: Hematology and Oncology

## 2023-01-21 VITALS — BP 127/72 | HR 70 | Temp 98.0°F | Resp 18 | Ht 66.0 in | Wt 189.0 lb

## 2023-01-21 DIAGNOSIS — R0781 Pleurodynia: Secondary | ICD-10-CM | POA: Diagnosis not present

## 2023-01-21 DIAGNOSIS — Z7901 Long term (current) use of anticoagulants: Secondary | ICD-10-CM | POA: Diagnosis not present

## 2023-01-21 DIAGNOSIS — Z79899 Other long term (current) drug therapy: Secondary | ICD-10-CM | POA: Insufficient documentation

## 2023-01-21 DIAGNOSIS — C50112 Malignant neoplasm of central portion of left female breast: Secondary | ICD-10-CM | POA: Diagnosis not present

## 2023-01-21 DIAGNOSIS — Z853 Personal history of malignant neoplasm of breast: Secondary | ICD-10-CM | POA: Diagnosis not present

## 2023-01-21 DIAGNOSIS — Z171 Estrogen receptor negative status [ER-]: Secondary | ICD-10-CM

## 2023-01-21 DIAGNOSIS — Z86718 Personal history of other venous thrombosis and embolism: Secondary | ICD-10-CM | POA: Insufficient documentation

## 2023-01-21 DIAGNOSIS — Z9013 Acquired absence of bilateral breasts and nipples: Secondary | ICD-10-CM | POA: Insufficient documentation

## 2023-01-21 DIAGNOSIS — Z7982 Long term (current) use of aspirin: Secondary | ICD-10-CM | POA: Insufficient documentation

## 2023-01-21 HISTORY — DX: Pleurodynia: R07.81

## 2023-01-21 NOTE — Telephone Encounter (Signed)
01/21/23 Spoke withpatient and confirmed next appt.

## 2023-01-21 NOTE — Progress Notes (Cosign Needed)
[facility]  [address] [name],  [place]  [phone]  Clinic Day:  01/21/2023  Referring physician: Paulina Fusi, MD  ASSESSMENT & PLAN:   Assessment & Plan: Malignant neoplasm of central portion of left female breast History of stage I triple negative breast cancer, diagnosed January 2012. She was treated with bilateral mastectomies alone. Genetic testing in 2015 was negative. She remains without obvious evidence of recurrence. She has new right rib pain, likely nonmalignant musculoskeletal pain, but I feel she needs a nuclear bone scan to evaluate for bone metastasis. She has multiple other complaints, but has had a recent CT abdomen, so I will request that report.  Rib pain on right side The patient reports new onset severe right rib pain and tenderness. Rib xray was negative. She takes hydrocodone/apap for back pain which has not helped this pain. I advised her it would be fine to try the muscle relaxer she has at home. Due to her history of triple negative breast cancer, I recommend nuclear bone scan to evaluate for bone metastasis.    The patient understands the plans discussed today and is in agreement with them.  She knows to contact our office if she develops concerns prior to her next appointment.   60 minutes was spent in patient care.  This included time spent preparing to see the patient (e.g., review of tests), obtaining and/or reviewing separately obtained history, counseling and educating the patient/family/caregiver, ordering medications, tests, or procedures; documenting clinical information in the electronic or other health record, independently interpreting results and communicating results to the patient/family/caregiver as well as coordination of care.      Adah Perl, PA-C  [facility] [place] - A DEPT OF [facility] [address] [place] [address] Dept:  [phone] Dept Fax: [phone]   Orders Placed This Encounter  Procedures   NM Bone Scan Whole Body    Standing Status:   Future    Expected Date:   01/28/2023    Expiration Date:   01/21/2024    Scheduling Instructions:     RH    If indicated for the ordered procedure, I authorize the administration of a radiopharmaceutical per Radiology protocol:   Yes    Preferred imaging location?:   External      CHIEF COMPLAINT:  CC: Remote history of stage IA triple negative breast cancer  Current Treatment:  Surveillance  HISTORY OF PRESENT ILLNESS:  Karen Shannon is a 72 y.o. female with a history of stage IA (T1b N0 M0) triple negative left breast cancer diagnosed in January 2012.  She underwent bilateral mastectomies.  Pathology revealed a 9 mm, grade 1, invasive ductal carcinoma with negative nodes.  Estrogen and progesterone receptors were negative and her 2 Neu negative.  Ki 67 was 9%.  She was placed on observation alone.  She was referred to genetic counselor at [facility] due to her triple negative disease.  She underwent testing with the [facility] Breast Next Panel.  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  She has multiple comorbidities, including severe COPD.  Screening colonoscopy in November 2018 with Dr. Charm Barges, at which time had removal of a small polyp.  Follow-up colonoscopy in 5 years was recommended.   She described transient ischemic attacks in September and October 2020 .  She is followed by by Dr. Madelaine Bhat  Everlena Cooper, a neurologist, at [facility].  She stated she was scheduled for an MRI/MRA head at [facility], as well as a cognitive test in January 2021.  We were concerned about temporal arteritis, but a sed rate was normal.    She states she had decreased memory on her cognitive test and he plans to repeat that yearly.  She is on memantine and donepezil and denies further episodes of TIA.  She was  found to have a deep venous thrombus of the left leg in at [facility] in February 2021, for which she was placed on apixaban 5 mg twice daily and was given samples. She could not afford to continue apixaban, so was placed on aspirin 81 mg daily once the thrombus resolved. She has a history of mitral valve prolapse and murmur, for which she sees Dr. Bing Matter.  She states she wore a heart monitor for in January 2021, which revealed complex arrhythmia. She had a stress test stress test in his office, which was normal.  She also had ultrasounds of the carotid arteries, which were normal.  She is on metoprolol and furosemide.  Bone density scan in July 2021 revealed osteopenia.  She has these done at Dr. Hoy Finlay office. She had eyelid surgery in October 2021. She undergoes routine lab work at Dr. Hoy Finlay office.    CTA heart in May revealed non obstructive coronary artery disease.   Oncology History  Malignant neoplasm of central portion of left female breast   Initial Diagnosis   Malignant neoplasm of central portion of left female breast (HCC)   02/21/2010 Cancer Staging   Staging form: Breast, AJCC 7th Edition - Clinical stage from 02/21/2010: Stage IA (T1b, N0, M0) - Signed by Dellia Beckwith, MD on 12/21/2020 Staged by: Managing physician Diagnostic confirmation: Positive histology Specimen type: Excision Histopathologic type: Infiltrating duct carcinoma, NOS Stage prefix: Initial diagnosis Laterality: Left Tumor size (mm): 9 Method of lymph node assessment: Sentinel lymph node biopsy Histologic grade (G): G1 Lymph-vascular invasion (LVI): LVI not present (absent)/not identified Residual tumor (R): R0 - None Paget's disease: Negative Tumor grade (Scarff-Bloom-Richardson system): G1 Estrogen receptor status: Negative Progesterone receptor status: Negative HER2 status: Negative Stage used in treatment planning: Yes National guidelines used in treatment planning: Yes Type of  national guideline used in treatment planning: NCCN   11/18/2013 Genetic Testing   Negative genetic testing on the BreastNext panel.  PALB2 p.L100F VUS.  The BreastNext gene panel offered by [facility] includes sequencing and rearrangement analysis for the following 17 genes:  ATM, BARD1, BRCA1, BRCA2, BRIP1, CDH1, CHEK2, MRE11A, MUTYH, NBN, NF1, PALB2, PTEN, RAD50, RAD51C, RAD51D, and TP53.   The report date was 11/18/2013.  UPDATE: PALB2 p.L100F has been reclassified to Likely Benign.  The amended report date is November 09, 2020.       INTERVAL HISTORY:  Arneda is here today for repeat clinical assessment.  She reports severe pain in the right ribs for several days, which she rates 6 out of 10 today.  She denies any injury to cause the pain.  She is on hydrocodone/APAP for pinched nerves in the lumbar spine, which has not helped her rib pain.  She has a history of bursitis of the bilateral hips.  She has resumed physical therapy for her back and hip pain.  She otherwise denies any changes in her mastectomy sites. She reports shortness of breath with exertion, she denies chest pain or palpitations. She reports abdominal bloating. She denies fevers or chills. Her appetite  is good. Her weight has decreased 3 pounds over last 3 months .    She is not sure if she has had on her bone density scan.  She continues calcium and vitamin D.  She reports multiple other issues.  She saw her pulmonologist yesterday and despite a CPAP has had episodes of apnea.  She is having another sleep study done.  She states something "drastic" may need to be done.  She had bilateral eyelid surgery again in August.  She had COVID in August.  She reports blood in her urine for 6 months.  She states she was treated for UTI, including IV antibiotics.  She was referred to Dr. Annabell Howells at [facility] in [place].  She states she had a CT abdomen revealed gallbladder stone.  I will request this report.  She is scheduled for follow-up with  Dr. Annabell Howells and cystoscopy on December 16.  He has her taking cephalexin 250 mg at bedtime for prevention at this time.  She states the last urine on December 9 at Dr. Hoy Finlay office was negative.  She states she sees Dr. Marcheta Grammes for thyroid nodules.  REVIEW OF SYSTEMS:  Review of Systems  Constitutional:  Negative for appetite change, chills, diaphoresis, fatigue, fever and unexpected weight change.  HENT:   Negative for lump/mass, mouth sores, nosebleeds, sore throat and trouble swallowing.   Eyes:  Positive for eye problems (mild macular degeneration, right eye).  Respiratory:  Positive for shortness of breath. Negative for chest tightness, cough and hemoptysis.   Cardiovascular:  Negative for chest pain, leg swelling and palpitations.  Gastrointestinal:  Negative for abdominal pain, blood in stool, constipation, diarrhea, nausea and vomiting.  Endocrine: Negative for hot flashes.  Genitourinary:  Positive for hematuria (chronic). Negative for difficulty urinating, dysuria, frequency, vaginal bleeding and vaginal discharge.   Musculoskeletal:  Negative for arthralgias, back pain, gait problem and myalgias.  Skin:  Negative for rash.  Neurological:  Positive for extremity weakness (right leg). Negative for dizziness, gait problem, headaches, light-headedness and numbness.  Hematological:  Negative for adenopathy. Does not bruise/bleed easily.  Psychiatric/Behavioral:  Negative for depression and sleep disturbance. The patient is not nervous/anxious.      VITALS:  Blood pressure 127/72, pulse 70, temperature 98 F (36.7 C), temperature source Oral, resp. rate 18, height 5\' 6"  (1.676 m), weight 189 lb (85.7 kg), SpO2 98%.  Wt Readings from Last 3 Encounters:  01/21/23 189 lb (85.7 kg)  10/09/22 192 lb 6.4 oz (87.3 kg)  09/01/22 191 lb 3.2 oz (86.7 kg)    Body mass index is 30.51 kg/m.  Performance status (ECOG): 2 - Symptomatic, <50% confined to bed  PHYSICAL EXAM:  Physical Exam Vitals  and nursing note reviewed.  Constitutional:      General: She is not in acute distress.    Appearance: Normal appearance.  HENT:     Head: Normocephalic and atraumatic.     Mouth/Throat:     Mouth: Mucous membranes are moist.     Pharynx: Oropharynx is clear. No oropharyngeal exudate or posterior oropharyngeal erythema.  Eyes:     General: No scleral icterus.    Extraocular Movements: Extraocular movements intact.     Conjunctiva/sclera: Conjunctivae normal.     Pupils: Pupils are equal, round, and reactive to light.  Cardiovascular:     Rate and Rhythm: Normal rate and regular rhythm.     Heart sounds: Normal heart sounds. No murmur heard.    No friction rub. No gallop.  Pulmonary:     Effort: Pulmonary effort is normal.     Breath sounds: Normal breath sounds. No wheezing, rhonchi or rales.  Chest:  Breasts:    Right: Absent.     Left: Absent.     Comments: Tenderness right anterior lateral ribs. No masses or other concerning lesions bilateral mastectomy sites Abdominal:     General: There is no distension.     Palpations: Abdomen is soft. There is no hepatomegaly, splenomegaly or mass.     Tenderness: There is no abdominal tenderness.  Musculoskeletal:        General: Normal range of motion.     Cervical back: Normal range of motion and neck supple. No tenderness.     Right lower leg: No edema.     Left lower leg: No edema.  Lymphadenopathy:     Cervical: No cervical adenopathy.     Upper Body:     Right upper body: No supraclavicular or axillary adenopathy.     Left upper body: No supraclavicular or axillary adenopathy.     Lower Body: No right inguinal adenopathy. No left inguinal adenopathy.  Skin:    General: Skin is warm and dry.     Coloration: Skin is not jaundiced.     Findings: No rash.  Neurological:     Mental Status: She is alert and oriented to person, place, and time.     Cranial Nerves: No cranial nerve deficit.  Psychiatric:        Mood and Affect:  Mood normal.        Behavior: Behavior normal.        Thought Content: Thought content normal.     LABS:       No data to display            Latest Ref Rng & Units 07/30/2022    9:41 AM 05/28/2022   12:00 PM 10/21/2021    5:10 PM  CMP  Glucose 70 - 99 mg/dL  90  92   BUN 8 - 27 mg/dL  17  17   Creatinine 1.61 - 1.00 mg/dL  0.96  0.45   Sodium 409 - 144 mmol/L  142  145   Potassium 3.5 - 5.2 mmol/L  4.4  4.2   Chloride 96 - 106 mmol/L  104  107   CO2 20 - 29 mmol/L  25  26   Calcium 8.7 - 10.3 mg/dL  9.1  9.4   Total Protein 6.0 - 8.5 g/dL 5.8     Total Bilirubin 0.0 - 1.2 mg/dL 0.6     Alkaline Phos 44 - 121 IU/L 91     AST 0 - 40 IU/L 19     ALT 0 - 32 IU/L 23       STUDIES:    Exam(s): [id number] RAD/DG RIBS W/ CHEST 3+V-R  EXAM:  EXAM DATE: 01/19/2023.  PROCEDURE: DG RIBS W/ CHEST 3+V-R.  CLINICAL HISTORY:  72 y/o old F with PLEURODYNIA.  COMPARISON:  None.  TECHNIQUE:  4 views of the chest were obtained with Right rib detail  FINDINGS:  No linear lucency, cortical irregularity or bony deformity to suggest acute displaced rib fracture.  No pulmonary parenchymal consolidative opacities.  No pneumothorax or pleural effusion. The cardiac silhouette is not enlarged. Dextroscoliosis of the lumbar region is noted.  IMPRESSION:  No radiographic evidence of acute displaced rib fracture.     HISTORY:   Past Medical History:  Diagnosis Date  Acute deep vein thrombosis (DVT) of left peroneal vein 04/11/2019   Aortic stenosis 04/12/2021   Basal cell carcinoma 07/25/2021   Cervical spondylosis 03/18/2018   Chronic bilateral low back pain without sciatica 03/18/2018   Chronic foot pain, left 03/14/2019   Possible fracture base 2nd metatarsal   Chronic neck pain 03/18/2018   COPD (chronic obstructive pulmonary disease)    DDD (degenerative disc disease), cervical 03/18/2018   Displaced fracture of fifth metatarsal bone, right foot 03/2020   Dyspnea on exertion  01/27/2018   Facet arthritis, degenerative, lumbar spine 03/18/2018   Family history of malignant neoplasm of breast    Generalized anxiety disorder 01/24/2019   Possible history of panic attacks   Genetic testing 11/14/2020   Negative genetic testing on the BreastNext panel.  PALB2 p.L100F VUS.  The BreastNext gene panel offered by [facility] includes sequencing and rearrangement analysis for the following 17 genes:  ATM, BARD1, BRCA1, BRCA2, BRIP1, CDH1, CHEK2, MRE11A, MUTYH, NBN, NF1, PALB2, PTEN, RAD50, RAD51C, RAD51D, and TP53.   The report date was 11/18/2013.     UPDATE: PALB2 p.L100F has been reclassified to Likely B   Greater trochanteric bursitis of both hips 04/10/2018   Heart murmur 01/27/2018   Left carotid artery stenosis 07/03/2022   Left leg swelling 02/23/2019   Lumbar spondylosis 03/18/2018   Malignant neoplasm of central portion of left female breast    S/p bilateral mastectomy   Mild cognitive impairment of uncertain or unknown etiology 03/20/2022   Mitral valve prolapse 01/27/2018   Neuropathy 06/17/2021   Obstructive sleep apnea    Variable CPAP use.   Osteopenia after menopause 12/21/2020   Other idiopathic scoliosis, lumbar region 03/18/2018   Pain of left calf 03/14/2019   Palpitations 01/25/2018   Peripheral vascular disease, unspecified 10/29/2020   up to 69% stenosis on the lef internal carotic artery based on cardiac ultrasounds from June 2022   Pes planus of left foot 02/23/2019   Pinched nerve 02/10/2021   Sprain of tarsometatarsal ligament of right foot 07/14/2019   Stasis dermatitis, acute 10/03/2019   Tenosynovitis of left ankle 04/11/2019   TIA (transient ischemic attack) 01/17/2019   Urge incontinence 07/28/2019   Varicose veins of left lower extremity    Ventricular tachycardia 02/28/2019    Past Surgical History:  Procedure Laterality Date   basal cell removed     Head   BUNIONECTOMY Right 1999   eye lid lift Bilateral    done July 2024    MASTECTOMY Bilateral 2012   NASAL SEPTUM SURGERY  1996   SKIN CANCER EXCISION  2018   Top of head    SKIN CANCER EXCISION  07/2019   Nose   TENDON TRANSFER Right 1999   foot   TONGUE BIOPSY  12/04/2021   Dr Marcheta Grammes    Family History  Problem Relation Age of Onset   Lymphoma Mother 78       deceased 67   Breast cancer Maternal Aunt        Dx 77s; Deceased 20s   Lung cancer Maternal Uncle    Stomach cancer Paternal Uncle        deceased 47   Liver cancer Maternal Uncle    Prostate cancer Other        3 mat cousins   Breast cancer Other 36       mother's mat half-sister   Emphysema Father     Social History:  reports that she has never smoked. She  has never used smokeless tobacco. She reports that she does not currently use alcohol. She reports that she does not use drugs.The patient is alone today.  Allergies:  Allergies  Allergen Reactions   Clarithromycin Diarrhea and Other (See Comments)   Prednisone Other (See Comments)    Passed out Other reaction(s): other   Sertraline Diarrhea   Sertraline Hcl Diarrhea   Duloxetine Hcl Itching, Nausea Only, Other (See Comments), Rash and Swelling    Current Medications: Current Outpatient Medications  Medication Sig Dispense Refill   cephALEXin (KEFLEX) 250 MG capsule Take 250 mg by mouth at bedtime.     loratadine (CLARITIN) 10 MG tablet      Multiple Vitamins-Minerals (PRESERVISION AREDS 2+MULTI VIT PO)      Triamcinolone Acetonide (KENALOG-80 IJ)      ascorbic acid (VITAMIN C) 500 MG tablet Take 500 mg by mouth daily.     aspirin 81 MG chewable tablet Chew 81 mg by mouth daily.     atorvastatin (LIPITOR) 80 MG tablet Take 1 tablet (80 mg total) by mouth daily. 90 tablet 2   calcium carbonate (SUPER CALCIUM) 1500 (600 Ca) MG TABS tablet Take 1 tablet by mouth daily.     Cholecalciferol 25 MCG (1000 UT) capsule Take 1,000 Units by mouth daily.     dicyclomine (BENTYL) 10 MG capsule Take 10 mg by mouth as needed for spasms  (IBS).     diphenoxylate-atropine (LOMOTIL) 2.5-0.025 MG tablet Take 1 tablet by mouth as needed for diarrhea or loose stools.     donepezil (ARICEPT) 10 MG tablet Take 10 mg by mouth at bedtime.     dorzolamide (TRUSOPT) 2 % ophthalmic solution Place 1 drop into the right eye 2 (two) times daily.     ergocalciferol (VITAMIN D2) 50000 units capsule Take 50,000 Units by mouth once a week.     ezetimibe (ZETIA) 10 MG tablet Take 1 tablet (10 mg total) by mouth daily. 90 tablet 3   furosemide (LASIX) 20 MG tablet Take 20 mg by mouth daily.     HYDROcodone-acetaminophen (NORCO) 7.5-325 MG tablet Take 1 tablet by mouth 3 (three) times daily.     memantine (NAMENDA) 5 MG tablet Take 5 mg by mouth 2 (two) times daily.     metoprolol succinate (TOPROL-XL) 25 MG 24 hr tablet TAKE 1 TABLET BY MOUTH EVERY DAY 90 tablet 2   montelukast (SINGULAIR) 10 MG tablet Take 10 mg by mouth at bedtime.     neomycin-polymyxin b-dexamethasone (MAXITROL) 3.5-10000-0.1 OINT      ondansetron (ZOFRAN) 4 MG tablet Take 4 mg by mouth every 8 (eight) hours as needed for nausea or vomiting.     solifenacin (VESICARE) 10 MG tablet Take 10 mg by mouth daily.     tamsulosin (FLOMAX) 0.4 MG CAPS capsule Take 0.4 mg by mouth daily after supper.     tiZANidine (ZANAFLEX) 4 MG tablet Take 4 mg by mouth every 8 (eight) hours as needed for muscle spasms.     vitamin B-12 (CYANOCOBALAMIN) 1000 MCG tablet Take 1,000 mcg by mouth daily.     No current facility-administered medications for this visit.

## 2023-01-21 NOTE — Assessment & Plan Note (Signed)
The patient reports new onset severe right rib pain and tenderness. Rib xray was negative. She takes hydrocodone/apap for back pain which has not helped this pain. I advised her it would be fine to try the muscle relaxer she has at home. Due to her history of triple negative breast cancer, I recommend nuclear bone scan to evaluate for bone metastasis.

## 2023-01-21 NOTE — Assessment & Plan Note (Addendum)
History of stage I triple negative breast cancer, diagnosed January 2012. She was treated with bilateral mastectomies alone. Genetic testing in 2015 was negative. She remains without obvious evidence of recurrence. She has new right rib pain, likely nonmalignant musculoskeletal pain, but I feel she needs a nuclear bone scan to evaluate for bone metastasis. She has multiple other complaints, but has had a recent CT abdomen, so I will request that report.

## 2023-01-23 DIAGNOSIS — R293 Abnormal posture: Secondary | ICD-10-CM | POA: Diagnosis not present

## 2023-01-23 DIAGNOSIS — G4733 Obstructive sleep apnea (adult) (pediatric): Secondary | ICD-10-CM | POA: Diagnosis not present

## 2023-01-23 DIAGNOSIS — R5383 Other fatigue: Secondary | ICD-10-CM | POA: Diagnosis not present

## 2023-01-23 DIAGNOSIS — M6281 Muscle weakness (generalized): Secondary | ICD-10-CM | POA: Diagnosis not present

## 2023-01-23 DIAGNOSIS — M5416 Radiculopathy, lumbar region: Secondary | ICD-10-CM | POA: Diagnosis not present

## 2023-01-26 DIAGNOSIS — N3021 Other chronic cystitis with hematuria: Secondary | ICD-10-CM | POA: Diagnosis not present

## 2023-01-26 DIAGNOSIS — N281 Cyst of kidney, acquired: Secondary | ICD-10-CM | POA: Diagnosis not present

## 2023-01-26 DIAGNOSIS — R293 Abnormal posture: Secondary | ICD-10-CM | POA: Diagnosis not present

## 2023-01-26 DIAGNOSIS — M6281 Muscle weakness (generalized): Secondary | ICD-10-CM | POA: Diagnosis not present

## 2023-01-26 DIAGNOSIS — M5416 Radiculopathy, lumbar region: Secondary | ICD-10-CM | POA: Diagnosis not present

## 2023-01-27 DIAGNOSIS — R0781 Pleurodynia: Secondary | ICD-10-CM | POA: Diagnosis not present

## 2023-01-27 DIAGNOSIS — Z853 Personal history of malignant neoplasm of breast: Secondary | ICD-10-CM | POA: Diagnosis not present

## 2023-01-30 DIAGNOSIS — M5416 Radiculopathy, lumbar region: Secondary | ICD-10-CM | POA: Diagnosis not present

## 2023-01-30 DIAGNOSIS — M6281 Muscle weakness (generalized): Secondary | ICD-10-CM | POA: Diagnosis not present

## 2023-01-30 DIAGNOSIS — R293 Abnormal posture: Secondary | ICD-10-CM | POA: Diagnosis not present

## 2023-02-02 DIAGNOSIS — M6281 Muscle weakness (generalized): Secondary | ICD-10-CM | POA: Diagnosis not present

## 2023-02-02 DIAGNOSIS — R293 Abnormal posture: Secondary | ICD-10-CM | POA: Diagnosis not present

## 2023-02-02 DIAGNOSIS — M5416 Radiculopathy, lumbar region: Secondary | ICD-10-CM | POA: Diagnosis not present

## 2023-02-06 DIAGNOSIS — N281 Cyst of kidney, acquired: Secondary | ICD-10-CM | POA: Diagnosis not present

## 2023-02-06 DIAGNOSIS — N130 Hydronephrosis with ureteropelvic junction obstruction: Secondary | ICD-10-CM | POA: Diagnosis not present

## 2023-02-09 ENCOUNTER — Other Ambulatory Visit (HOSPITAL_COMMUNITY): Payer: Self-pay | Admitting: Urology

## 2023-02-09 DIAGNOSIS — N1330 Unspecified hydronephrosis: Secondary | ICD-10-CM

## 2023-02-09 DIAGNOSIS — M5416 Radiculopathy, lumbar region: Secondary | ICD-10-CM | POA: Diagnosis not present

## 2023-02-09 DIAGNOSIS — M6281 Muscle weakness (generalized): Secondary | ICD-10-CM | POA: Diagnosis not present

## 2023-02-09 DIAGNOSIS — R293 Abnormal posture: Secondary | ICD-10-CM | POA: Diagnosis not present

## 2023-02-13 ENCOUNTER — Encounter: Payer: Self-pay | Admitting: Hematology and Oncology

## 2023-02-18 DIAGNOSIS — M4316 Spondylolisthesis, lumbar region: Secondary | ICD-10-CM | POA: Diagnosis not present

## 2023-02-18 DIAGNOSIS — M5416 Radiculopathy, lumbar region: Secondary | ICD-10-CM | POA: Diagnosis not present

## 2023-02-20 ENCOUNTER — Encounter (HOSPITAL_COMMUNITY)
Admission: RE | Admit: 2023-02-20 | Discharge: 2023-02-20 | Disposition: A | Discharge status: Home / Self Care | Payer: Medicare Other | Source: Ambulatory Visit | Attending: Urology | Admitting: Urology

## 2023-02-20 DIAGNOSIS — R935 Abnormal findings on diagnostic imaging of other abdominal regions, including retroperitoneum: Secondary | ICD-10-CM | POA: Diagnosis not present

## 2023-02-20 DIAGNOSIS — N1330 Unspecified hydronephrosis: Secondary | ICD-10-CM | POA: Diagnosis not present

## 2023-02-20 DIAGNOSIS — N281 Cyst of kidney, acquired: Secondary | ICD-10-CM | POA: Diagnosis not present

## 2023-02-20 MED ORDER — FUROSEMIDE 10 MG/ML IJ SOLN
INTRAMUSCULAR | Status: AC
  Filled 2023-02-20: qty 2

## 2023-02-20 MED ORDER — FUROSEMIDE 10 MG/ML IJ SOLN
INTRAMUSCULAR | Status: AC
  Filled 2023-02-20: qty 4

## 2023-02-20 MED ORDER — TECHNETIUM TC 99M MERTIATIDE
5.5000 | Freq: Once | INTRAVENOUS | Status: AC | PRN
  Administered 2023-02-20: 5.5 via INTRAVENOUS

## 2023-02-20 MED ORDER — FUROSEMIDE 10 MG/ML IJ SOLN
42.0000 mg | Freq: Once | INTRAMUSCULAR | Status: AC
  Administered 2023-02-20: 42 mg via INTRAVENOUS

## 2023-03-02 NOTE — Progress Notes (Deleted)
Cardiology Office Note:    Date:  03/02/2023   ID:  Karen Shannon, DOB 1950/06/24, MRN [id number]  PCP:  Paulina Fusi, MD   [facility] Providers Cardiologist:  Gypsy Balsam, MD     Referring MD: Paulina Fusi, MD   CC: follow up   History of Present Illness:    Karen Shannon is a 73 y.o. female with a hx of nonobstructive CAD per coronary CTA in 2024, DVT--could not afford Eliquis and takes aspirin, carotid artery stenosis followed by vascular, mitral valve prolapse, TIA, ventricular tachycardia, PVD, aortic stenosis, OSA on CPAP, COPD, breast cancer 2012, palpitations.  05/28/2022 coronary CTA calcium score 216, 83rd percentile, mild nonobstructive CAD 04/15/2022 carotid duplex no stenosis right ICA, left ICA 40 to 59% stenosis 11/01/2021 echo EF 55 to 60%, grade 2 DD, mild thickening aortic aortic valve, mild aortic valve stenosis  She initially established care with Dr. Bing Matter on 01/27/2018 at the behest of her PCP for evaluation of palpitations and dizziness.    Evaluated on 05/28/2022 for follow-up of her nonobstructive CAD, she was well from a cardiac perspective, trying to exercise 30 minutes most days of the week but was having episodes of chest pain that had mixed features.  We arranged for coronary CTA which revealed mild nonobstructive CAD.   Past Medical History:  Diagnosis Date   Acute deep vein thrombosis (DVT) of left peroneal vein 04/11/2019   Aortic stenosis 04/12/2021   Basal cell carcinoma 07/25/2021   Cervical spondylosis 03/18/2018   Chronic bilateral low back pain without sciatica 03/18/2018   Chronic foot pain, left 03/14/2019   Possible fracture base 2nd metatarsal   Chronic neck pain 03/18/2018   COPD (chronic obstructive pulmonary disease)    DDD (degenerative disc disease), cervical 03/18/2018   Displaced fracture of fifth metatarsal bone, right foot 03/2020   Dyspnea on exertion 01/27/2018   Facet arthritis,  degenerative, lumbar spine 03/18/2018   Family history of malignant neoplasm of breast    Generalized anxiety disorder 01/24/2019   Possible history of panic attacks   Genetic testing 11/14/2020   Negative genetic testing on the BreastNext panel.  PALB2 p.L100F VUS.  The BreastNext gene panel offered by [facility] includes sequencing and rearrangement analysis for the following 17 genes:  ATM, BARD1, BRCA1, BRCA2, BRIP1, CDH1, CHEK2, MRE11A, MUTYH, NBN, NF1, PALB2, PTEN, RAD50, RAD51C, RAD51D, and TP53.   The report date was 11/18/2013.     UPDATE: PALB2 p.L100F has been reclassified to Likely B   Greater trochanteric bursitis of both hips 04/10/2018   Heart murmur 01/27/2018   Left carotid artery stenosis 07/03/2022   Left leg swelling 02/23/2019   Lumbar spondylosis 03/18/2018   Malignant neoplasm of central portion of left female breast    S/p bilateral mastectomy   Mild cognitive impairment of uncertain or unknown etiology 03/20/2022   Mitral valve prolapse 01/27/2018   Neuropathy 06/17/2021   Obstructive sleep apnea    Variable CPAP use.   Osteopenia after menopause 12/21/2020   Other idiopathic scoliosis, lumbar region 03/18/2018   Pain of left calf 03/14/2019   Palpitations 01/25/2018   Peripheral vascular disease, unspecified 10/29/2020   up to 69% stenosis on the lef internal carotic artery based on cardiac ultrasounds from June 2022   Pes planus of left foot 02/23/2019   Pinched nerve 02/10/2021   Sprain of tarsometatarsal ligament of right foot 07/14/2019   Stasis dermatitis, acute 10/03/2019   Tenosynovitis of left ankle  04/11/2019   TIA (transient ischemic attack) 01/17/2019   Urge incontinence 07/28/2019   Varicose veins of left lower extremity    Ventricular tachycardia 02/28/2019    Past Surgical History:  Procedure Laterality Date   basal cell removed     Head   BUNIONECTOMY Right 1999   eye lid lift Bilateral    done July 2024   MASTECTOMY Bilateral 2012    NASAL SEPTUM SURGERY  1996   SKIN CANCER EXCISION  2018   Top of head    SKIN CANCER EXCISION  07/2019   Nose   TENDON TRANSFER Right 1999   foot   TONGUE BIOPSY  12/04/2021   Dr Marcheta Grammes    Current Medications: Current Meds  Medication Sig   ascorbic acid (VITAMIN C) 500 MG tablet Take 500 mg by mouth daily.   aspirin 81 MG chewable tablet Chew 81 mg by mouth daily.   atorvastatin (LIPITOR) 80 MG tablet Take 1 tablet (80 mg total) by mouth daily.   calcium carbonate (SUPER CALCIUM) 1500 (600 Ca) MG TABS tablet Take 1 tablet by mouth daily.   cephALEXin (KEFLEX) 250 MG capsule Take 250 mg by mouth at bedtime.   Cholecalciferol 25 MCG (1000 UT) capsule Take 1,000 Units by mouth daily.   dicyclomine (BENTYL) 10 MG capsule Take 10 mg by mouth as needed for spasms (IBS).   diphenoxylate-atropine (LOMOTIL) 2.5-0.025 MG tablet Take 1 tablet by mouth as needed for diarrhea or loose stools.   donepezil (ARICEPT) 10 MG tablet Take 10 mg by mouth at bedtime.   dorzolamide (TRUSOPT) 2 % ophthalmic solution Place 1 drop into the right eye 2 (two) times daily.   ergocalciferol (VITAMIN D2) 50000 units capsule Take 50,000 Units by mouth once a week.   ezetimibe (ZETIA) 10 MG tablet Take 1 tablet (10 mg total) by mouth daily.   furosemide (LASIX) 20 MG tablet Take 20 mg by mouth daily.   HYDROcodone-acetaminophen (NORCO) 7.5-325 MG tablet Take 1 tablet by mouth 3 (three) times daily.   loratadine (CLARITIN) 10 MG tablet Take 10 mg by mouth daily as needed for allergies.   memantine (NAMENDA) 5 MG tablet Take 5 mg by mouth 2 (two) times daily.   metoprolol succinate (TOPROL-XL) 25 MG 24 hr tablet TAKE 1 TABLET BY MOUTH EVERY DAY (Patient taking differently: Take 25 mg by mouth daily.)   montelukast (SINGULAIR) 10 MG tablet Take 10 mg by mouth at bedtime.   Multiple Vitamins-Minerals (PRESERVISION AREDS 2+MULTI VIT PO) Take 1 tablet by mouth daily.   neomycin-polymyxin b-dexamethasone (MAXITROL)  3.5-10000-0.1 OINT Place 1 Application into both eyes 3 (three) times daily.   ondansetron (ZOFRAN) 4 MG tablet Take 4 mg by mouth every 8 (eight) hours as needed for nausea or vomiting.   solifenacin (VESICARE) 10 MG tablet Take 10 mg by mouth daily.   tamsulosin (FLOMAX) 0.4 MG CAPS capsule Take 0.4 mg by mouth daily after supper.   tiZANidine (ZANAFLEX) 4 MG tablet Take 4 mg by mouth every 8 (eight) hours as needed for muscle spasms.   Triamcinolone Acetonide (KENALOG-80 IJ) Inject 1 Dose as directed once.   vitamin B-12 (CYANOCOBALAMIN) 1000 MCG tablet Take 1,000 mcg by mouth daily.     Allergies:   Clarithromycin, Prednisone, Sertraline, Sertraline hcl, and Duloxetine hcl   Social History   Socioeconomic History   Marital status: Single    Spouse name: Not on file   Number of children: 0   Years of education:  16   Highest education level: Bachelor's degree (e.g., BA, AB, BS)  Occupational History   Occupation: Retired  Tobacco Use   Smoking status: Never   Smokeless tobacco: Never  Vaping Use   Vaping status: Never Used  Substance and Sexual Activity   Alcohol use: Not Currently   Drug use: Never   Sexual activity: Not Currently  Other Topics Concern   Not on file  Social History Narrative   Pt is single she lives alone has no children   Right handed   Social Drivers of Corporate investment banker Strain: Not on file  Food Insecurity: Not on file  Transportation Needs: Not on file  Physical Activity: Not on file  Stress: Not on file  Social Connections: Not on file     Family History: [name] history includes Breast cancer in her maternal aunt; Breast cancer (age of onset: 35) in an other family member; Emphysema in her father; Liver cancer in her maternal uncle; Lung cancer in her maternal uncle; Lymphoma (age of onset: 67) in her mother; Prostate cancer in an other family member; Stomach cancer in her paternal uncle.  ROS:   Please see the history  of present illness.     All other systems reviewed and are negative.  EKGs/Labs/Other Studies Reviewed:    The following studies were reviewed today: Cardiac Studies & Procedures     STRESS TESTS  MYOCARDIAL PERFUSION IMAGING 04/05/2019  Narrative  The left ventricular ejection fraction is hyperdynamic (>65%).  Nuclear stress EF: 66%.  There was no ST segment deviation noted during stress.  Defect 1: There is a defect present in the basal inferior and mid inferior location. Suggests diaphragmatic attenuation.  The study is normal.  This is a low risk study.  ECHOCARDIOGRAM  ECHOCARDIOGRAM COMPLETE 11/01/2021  Narrative ECHOCARDIOGRAM REPORT    Patient Name:   Karen Shannon Date of Exam: 11/01/2021 Medical Rec #:  [id number]            Height:       65.5 in Accession #:    [id number]           Weight:       188.4 lb Date of Birth:  [birth year]             BSA:          1.940 m Patient Age:    71 years             BP:           124/78 mmHg Patient Gender: F                    HR:           65 bpm. Exam Location:  [employer]  Procedure: 2D Echo, Cardiac Doppler, Color Doppler and Strain Analysis  Indications:    Ventricular tachycardia (HCC) [I47.20 (ICD-10-CM)]; Peripheral vascular disease, unspecified (HCC) up to 69% stenosis on the lef internal carotic artery based on cardiac ultrasounds from June 2022 t [I73.9 (ICD-10-CM)]; Lumbar spondylosis [id number] (ICD-10-CM)]; Palpitations [R00.2 (ICD-10-CM)]; Late effect of cerebrovascular accident (CVA) right basal ganglion noted on MRI in June 2022 felt to be small vessel disease but [I69.30 (ICD-10-CM)]; Mitral valve prolapse [I34.1 (ICD-10-CM)]  History:        Patient has prior history of Echocardiogram examinations, most recent 07/23/2020. TIA and COPD, Aortic Valve Disease; Signs/Symptoms:Dyspnea.  Sonographer:    Margreta Journey RDCS Referring Phys: [phone]  ROBERT J KRASOWSKI  IMPRESSIONS   1. GLS -15.7.  Left ventricular ejection fraction, by estimation, is 55 to 60%. The left ventricle has normal function. The left ventricle has no regional wall motion abnormalities. Left ventricular diastolic parameters are consistent with Grade II diastolic dysfunction (pseudonormalization). 2. Right ventricular systolic function is normal. The right ventricular size is normal. There is normal pulmonary artery systolic pressure. 3. The mitral valve is normal in structure. No evidence of mitral valve regurgitation. No evidence of mitral stenosis. 4. The aortic valve is normal in structure. There is mild thickening of the aortic valve. Aortic valve regurgitation is not visualized. Mild aortic valve stenosis. Aortic valve mean gradient measures 16.0 mmHg. 5. The inferior vena cava is normal in size with greater than 50% respiratory variability, suggesting right atrial pressure of 3 mmHg.  FINDINGS Left Ventricle: GLS -15.7. Left ventricular ejection fraction, by estimation, is 55 to 60%. The left ventricle has normal function. The left ventricle has no regional wall motion abnormalities. The left ventricular internal cavity size was normal in size. There is no left ventricular hypertrophy. Left ventricular diastolic parameters are consistent with Grade II diastolic dysfunction (pseudonormalization).  Right Ventricle: The right ventricular size is normal. No increase in right ventricular wall thickness. Right ventricular systolic function is normal. There is normal pulmonary artery systolic pressure. The tricuspid regurgitant velocity is 2.28 m/s, and with an assumed right atrial pressure of 8 mmHg, the estimated right ventricular systolic pressure is 28.8 mmHg.  Left Atrium: Left atrial size was normal in size.  Right Atrium: Right atrial size was normal in size.  Pericardium: There is no evidence of pericardial effusion.  Mitral Valve: The mitral valve is normal in structure. No evidence of mitral valve  regurgitation. No evidence of mitral valve stenosis.  Tricuspid Valve: The tricuspid valve is normal in structure. Tricuspid valve regurgitation is mild . No evidence of tricuspid stenosis.  Aortic Valve: The aortic valve is normal in structure. There is mild thickening of the aortic valve. Aortic valve regurgitation is not visualized. Mild aortic stenosis is present. Aortic valve mean gradient measures 16.0 mmHg. Aortic valve peak gradient measures 25.4 mmHg. Aortic valve area, by VTI measures 0.97 cm.  Pulmonic Valve: The pulmonic valve was normal in structure. Pulmonic valve regurgitation is not visualized. No evidence of pulmonic stenosis.  Aorta: The aortic root is normal in size and structure.  Venous: The inferior vena cava is normal in size with greater than 50% respiratory variability, suggesting right atrial pressure of 3 mmHg.  IAS/Shunts: No atrial level shunt detected by color flow Doppler.   LEFT VENTRICLE PLAX 2D LVIDd:         3.00 cm   Diastology LVIDs:         2.20 cm   LV e' medial:    5.77 cm/s LV PW:         1.10 cm   LV E/e' medial:  18.9 LV IVS:        1.10 cm   LV e' lateral:   7.07 cm/s LVOT diam:     1.90 cm   LV E/e' lateral: 15.4 LV SV:         56 LV SV Index:   29 LVOT Area:     2.84 cm   RIGHT VENTRICLE             IVC RV Basal diam:  3.00 cm     IVC diam: 2.50 cm RV S prime:  11.20 cm/s TAPSE (M-mode): 2.3 cm  LEFT ATRIUM             Index        RIGHT ATRIUM           Index LA diam:        2.60 cm 1.34 cm/m   RA Area:     14.50 cm LA Vol (A2C):   53.2 ml 27.43 ml/m  RA Volume:   33.00 ml  17.01 ml/m LA Vol (A4C):   38.7 ml 19.95 ml/m LA Biplane Vol: 45.7 ml 23.56 ml/m AORTIC VALVE AV Area (Vmax):    1.02 cm AV Area (Vmean):   0.87 cm AV Area (VTI):     0.97 cm AV Vmax:           252.00 cm/s AV Vmean:          192.000 cm/s AV VTI:            0.579 m AV Peak Grad:      25.4 mmHg AV Mean Grad:      16.0 mmHg LVOT Vmax:          90.50 cm/s LVOT Vmean:        58.900 cm/s LVOT VTI:          0.199 m LVOT/AV VTI ratio: 0.34  AORTA Ao Root diam: 3.40 cm Ao Asc diam:  3.40 cm  MITRAL VALVE                TRICUSPID VALVE MV Area (PHT): 3.99 cm     TR Peak grad:   20.8 mmHg MV Decel Time: 190 msec     TR Vmax:        228.00 cm/s MV E velocity: 109.00 cm/s MV A velocity: 120.00 cm/s  SHUNTS MV E/A ratio:  0.91         Systemic VTI:  0.20 m Systemic Diam: 1.90 cm  Gypsy Balsam MD Electronically signed by Gypsy Balsam MD Signature Date/Time: 11/02/2021/12:03:57 PM    Final   MONITORS  LONG TERM MONITOR (3-14 DAYS) 02/17/2019  Narrative Karen Shannon, DOB [birth year], MRN [id number]  HOLTER MONITOR REPORT:    Date of test:                 01/22/2019 Duration of test:           13 Indication:                    Palpitations Ordering physician:  Georgeanna Lea, MD Referring physician:  Georgeanna Lea, MD   Baseline rhythm: Sinus  Minimum heart rate: 52 BPM.  Average heart rate: 78 BPM.  Maximal heart rate 869 BPM.  Atrial arrhythmia: Total of 11 narrow complex tachycardia fastest episode 16 beats at 154 bpm  Ventricular arrhythmia: 3 episodes of ventricular tachycardia longest and fastest 12 beats at rate of 169  Conduction abnormality: None  Symptoms: Multiple trigger events for PVCs   Conclusion: Ventricular tachycardia: Total 3 episodes.  Longest 12 beats.  Asymptomatic. 11 narrow complex tachycardias Symptomatic PVCs  Interpreting  cardiologist: Gypsy Balsam, MD Date: 02/20/2019 9:07 PM            EKG:  EKG is not ordered today.    Recent Labs: 05/28/2022: BUN 17; Creatinine, Ser 0.80; Potassium 4.4; Sodium 142 07/30/2022: ALT 23  Recent Lipid Panel    Component Value Date/Time   CHOL 127 07/30/2022 0941   TRIG 49 07/30/2022  0941   HDL 53 07/30/2022 0941   CHOLHDL 2.4 07/30/2022 0941   LDLCALC 63 07/30/2022 0941   LDLDIRECT 89 10/29/2020 1038     Risk  Assessment/Calculations:      No BP recorded.  {Refresh Note OR Click here to enter BP  :1}***         Physical Exam:    VS:  There were no vitals taken for this visit.    Wt Readings from Last 3 Encounters:  01/21/23 189 lb (85.7 kg)  10/09/22 192 lb 6.4 oz (87.3 kg)  09/01/22 191 lb 3.2 oz (86.7 kg)     GEN:  Well nourished, well developed in no acute distress HEENT: Normal NECK: No JVD; No carotid bruits LYMPHATICS: No lymphadenopathy CARDIAC: RRR, no murmurs, rubs, gallops RESPIRATORY:  Clear to auscultation without rales, wheezing or rhonchi  ABDOMEN: Soft, non-tender, non-distended MUSCULOSKELETAL:  No edema; No deformity  SKIN: Warm and dry NEUROLOGIC:  Alert and oriented x 3 PSYCHIATRIC:  Normal affect   ASSESSMENT:    No diagnosis found.  PLAN:    In order of problems listed above:  Precordial pain/non-obstructive CAD-most recent stress test in 2021 was unrevealing for ischemia.  We arranged for coronary CTA which revealed nonobstructive CAD.  Continue aspirin 81 mg daily, continue metoprolol 25 mg daily.  She has had no further incidents of chest pain.  Ventricular tachycardia/palpitations -she wore a Holter monitor in 2021 which revealed episodes of ventricular tachycardia, she did not have any symptoms associated with this.  Palpitations have been quiescent for her.  Continue metoprolol 25 mg daily.  Carotid artery stenosis -most recent carotid duplex on 04/15/2022 revealed no stenosis right ICA, 40 to 59% stenosis left ICA.  Followed by vascular.  Continue atorvastatin 80 mg daily.  HLD-most recent LDL on 07/30/22 was 63, continue Lipitor 80 mg daily.    Disposition- return in 6 months.    Medication Adjustments/Labs and Tests Ordered: Current medicines are reviewed at length with the patient today.  Concerns regarding medicines are outlined above.  No orders of the defined types were placed in this encounter.  No orders of the defined types were placed  in this encounter.   There are no Patient Instructions on file for this visit.   Signed, Flossie Dibble, NP  03/02/2023 4:59 PM     [facility]

## 2023-03-03 ENCOUNTER — Ambulatory Visit: Payer: Medicare Other | Attending: Cardiology | Admitting: Cardiology

## 2023-03-03 DIAGNOSIS — I2510 Atherosclerotic heart disease of native coronary artery without angina pectoris: Secondary | ICD-10-CM

## 2023-03-03 DIAGNOSIS — J4520 Mild intermittent asthma, uncomplicated: Secondary | ICD-10-CM | POA: Diagnosis not present

## 2023-03-03 DIAGNOSIS — I472 Ventricular tachycardia, unspecified: Secondary | ICD-10-CM

## 2023-03-03 DIAGNOSIS — R5383 Other fatigue: Secondary | ICD-10-CM | POA: Diagnosis not present

## 2023-03-03 DIAGNOSIS — G4733 Obstructive sleep apnea (adult) (pediatric): Secondary | ICD-10-CM | POA: Diagnosis not present

## 2023-03-03 DIAGNOSIS — E785 Hyperlipidemia, unspecified: Secondary | ICD-10-CM

## 2023-03-16 DIAGNOSIS — Z860100 Personal history of colon polyps, unspecified: Secondary | ICD-10-CM

## 2023-03-16 DIAGNOSIS — Z1211 Encounter for screening for malignant neoplasm of colon: Secondary | ICD-10-CM | POA: Diagnosis not present

## 2023-03-16 HISTORY — DX: Personal history of colon polyps, unspecified: Z86.0100

## 2023-03-18 DIAGNOSIS — Z23 Encounter for immunization: Secondary | ICD-10-CM | POA: Diagnosis not present

## 2023-03-23 NOTE — Progress Notes (Signed)
[facility] - Clinic Note  03/30/2023     CHIEF COMPLAINT Patient presents for Retina Follow Up   HISTORY OF PRESENT ILLNESS: Karen Shannon is a 73 y.o. female who presents to the clinic today for:   HPI     Retina Follow Up   Patient presents with  Other.  In left eye.  This started 9 months ago.  I, the attending physician,  performed the HPI with the patient and updated documentation appropriately.        Comments   Patient states the vision has been blurry. She is using Dorz OD BID and AT's.       Last edited by Rennis Chris, MD on 03/31/2023 12:55 AM.    Pt had eyelid sx with Dr. Delynn Flavin since she was here last, she feels like her eyes are very blurry occasionally   Referring physician: Diona Foley, MD [address] [place],  [place] [address]  HISTORICAL INFORMATION:  Selected notes from the MEDICAL RECORD NUMBER Referred by Dr. Alben Spittle for eval of PVD OU LEE: 10.20.2022 Ocular Hx-PVD OU, cataracts, glc suspect   CURRENT MEDICATIONS: Current Outpatient Medications (Ophthalmic Drugs)  Medication Sig   dorzolamide (TRUSOPT) 2 % ophthalmic solution Place 1 drop into the right eye 2 (two) times daily.   neomycin-polymyxin b-dexamethasone (MAXITROL) 3.5-10000-0.1 OINT Place 1 Application into both eyes 3 (three) times daily.   No current facility-administered medications for this visit. (Ophthalmic Drugs)   Current Outpatient Medications (Other)  Medication Sig   ascorbic acid (VITAMIN C) 500 MG tablet Take 500 mg by mouth daily.   aspirin 81 MG chewable tablet Chew 81 mg by mouth daily.   atorvastatin (LIPITOR) 80 MG tablet Take 1 tablet (80 mg total) by mouth daily.   calcium carbonate (SUPER CALCIUM) 1500 (600 Ca) MG TABS tablet Take 1 tablet by mouth daily.   cephALEXin (KEFLEX) 250 MG capsule Take 250 mg by mouth at bedtime.   Cholecalciferol 25 MCG (1000 UT) capsule Take 1,000 Units by mouth daily.   dicyclomine  (BENTYL) 10 MG capsule Take 10 mg by mouth as needed for spasms (IBS).   diphenoxylate-atropine (LOMOTIL) 2.5-0.025 MG tablet Take 1 tablet by mouth as needed for diarrhea or loose stools.   donepezil (ARICEPT) 10 MG tablet Take 10 mg by mouth at bedtime.   ergocalciferol (VITAMIN D2) 50000 units capsule Take 50,000 Units by mouth once a week.   ezetimibe (ZETIA) 10 MG tablet Take 1 tablet (10 mg total) by mouth daily.   furosemide (LASIX) 20 MG tablet Take 20 mg by mouth daily.   HYDROcodone-acetaminophen (NORCO) 7.5-325 MG tablet Take 1 tablet by mouth 3 (three) times daily.   loratadine (CLARITIN) 10 MG tablet Take 10 mg by mouth daily as needed for allergies.   memantine (NAMENDA) 5 MG tablet Take 5 mg by mouth 2 (two) times daily.   metoprolol succinate (TOPROL-XL) 25 MG 24 hr tablet TAKE 1 TABLET BY MOUTH EVERY DAY (Patient taking differently: Take 25 mg by mouth daily.)   montelukast (SINGULAIR) 10 MG tablet Take 10 mg by mouth at bedtime.   Multiple Vitamins-Minerals (PRESERVISION AREDS 2+MULTI VIT PO) Take 1 tablet by mouth daily.   ondansetron (ZOFRAN) 4 MG tablet Take 4 mg by mouth every 8 (eight) hours as needed for nausea or vomiting.   solifenacin (VESICARE) 10 MG tablet Take 10 mg by mouth daily.   tamsulosin (FLOMAX) 0.4 MG CAPS capsule Take 0.4 mg by  mouth daily after supper.   tiZANidine (ZANAFLEX) 4 MG tablet Take 4 mg by mouth every 8 (eight) hours as needed for muscle spasms.   Triamcinolone Acetonide (KENALOG-80 IJ) Inject 1 Dose as directed once.   vitamin B-12 (CYANOCOBALAMIN) 1000 MCG tablet Take 1,000 mcg by mouth daily.   No current facility-administered medications for this visit. (Other)   REVIEW OF SYSTEMS: ROS   Positive for: Neurological, Skin, Musculoskeletal, Cardiovascular, Eyes, Respiratory Negative for: Constitutional, Gastrointestinal, Genitourinary, HENT, Endocrine, Psychiatric, Allergic/Imm, Heme/Lymph Last edited by Charlette Caffey, COT on  03/30/2023 12:12 PM.     ALLERGIES Allergies  Allergen Reactions   Clarithromycin Diarrhea and Other (See Comments)   Prednisone Other (See Comments)    Passed out Other reaction(s): other   Sertraline Diarrhea   Sertraline Hcl Diarrhea   Duloxetine Hcl Itching, Nausea Only, Other (See Comments), Rash and Swelling   PAST MEDICAL HISTORY Past Medical History:  Diagnosis Date   Acute deep vein thrombosis (DVT) of left peroneal vein 04/11/2019   Aortic stenosis 04/12/2021   Basal cell carcinoma 07/25/2021   Cervical spondylosis 03/18/2018   Chronic bilateral low back pain without sciatica 03/18/2018   Chronic foot pain, left 03/14/2019   Possible fracture base 2nd metatarsal   Chronic neck pain 03/18/2018   COPD (chronic obstructive pulmonary disease)    DDD (degenerative disc disease), cervical 03/18/2018   Displaced fracture of fifth metatarsal bone, right foot 03/2020   Dyspnea on exertion 01/27/2018   Facet arthritis, degenerative, lumbar spine 03/18/2018   Family history of malignant neoplasm of breast    Generalized anxiety disorder 01/24/2019   Possible history of panic attacks   Genetic testing 11/14/2020   Negative genetic testing on the BreastNext panel.  PALB2 p.L100F VUS.  The BreastNext gene panel offered by [facility] includes sequencing and rearrangement analysis for the following 17 genes:  ATM, BARD1, BRCA1, BRCA2, BRIP1, CDH1, CHEK2, MRE11A, MUTYH, NBN, NF1, PALB2, PTEN, RAD50, RAD51C, RAD51D, and TP53.   The report date was 11/18/2013.     UPDATE: PALB2 p.L100F has been reclassified to Likely B   Greater trochanteric bursitis of both hips 04/10/2018   Heart murmur 01/27/2018   Left carotid artery stenosis 07/03/2022   Left leg swelling 02/23/2019   Lumbar spondylosis 03/18/2018   Malignant neoplasm of central portion of left female breast    S/p bilateral mastectomy   Mild cognitive impairment of uncertain or unknown etiology 03/20/2022   Mitral valve prolapse  01/27/2018   Neuropathy 06/17/2021   Obstructive sleep apnea    Variable CPAP use.   Osteopenia after menopause 12/21/2020   Other idiopathic scoliosis, lumbar region 03/18/2018   Pain of left calf 03/14/2019   Palpitations 01/25/2018   Peripheral vascular disease, unspecified 10/29/2020   up to 69% stenosis on the lef internal carotic artery based on cardiac ultrasounds from June 2022   Pes planus of left foot 02/23/2019   Pinched nerve 02/10/2021   Sprain of tarsometatarsal ligament of right foot 07/14/2019   Stasis dermatitis, acute 10/03/2019   Tenosynovitis of left ankle 04/11/2019   TIA (transient ischemic attack) 01/17/2019   Urge incontinence 07/28/2019   Varicose veins of left lower extremity    Ventricular tachycardia 02/28/2019   Past Surgical History:  Procedure Laterality Date   basal cell removed     Head   BUNIONECTOMY Right 1999   eye lid lift Bilateral    done July 2024   MASTECTOMY Bilateral 2012   NASAL SEPTUM  SURGERY  1996   SKIN CANCER EXCISION  2018   Top of head    SKIN CANCER EXCISION  07/2019   Nose   TENDON TRANSFER Right 1999   foot   TONGUE BIOPSY  12/04/2021   Dr Marcheta Grammes   FAMILY HISTORY Family History  Problem Relation Age of Onset   Lymphoma Mother 71       deceased 75   Breast cancer Maternal Aunt        Dx 5s; Deceased 62s   Lung cancer Maternal Uncle    Stomach cancer Paternal Uncle        deceased 84   Liver cancer Maternal Uncle    Prostate cancer Other        3 mat cousins   Breast cancer Other 43       mother's mat half-sister   Emphysema Father    SOCIAL HISTORY Social History   Tobacco Use   Smoking status: Never   Smokeless tobacco: Never  Vaping Use   Vaping status: Never Used  Substance Use Topics   Alcohol use: Not Currently   Drug use: Never       OPHTHALMIC EXAM: Base Eye Exam     Visual Acuity (Snellen - Linear)       Right Left   Dist [place] 20/30 20/25 +2   Dist ph [place] NI NI         Tonometry  (Tonopen, 12:15 PM)       Right Left   Pressure 16 16         Pupils       Dark Light Shape React APD   Right 3 2 Round Brisk None   Left 3 2 Round Brisk None         Visual Fields       Left Right    Full Full         Extraocular Movement       Right Left    Full, Ortho Full, Ortho         Neuro/Psych     Oriented x3: Yes   Mood/Affect: Normal         Dilation     Both eyes: 1.0% Mydriacyl, 2.5% Phenylephrine @ 12:13 PM           Slit Lamp and Fundus Exam     External Exam       Right Left   External Normal Normal         Slit Lamp Exam       Right Left   Lids/Lashes Dermatochalasis - upper lid Dermatochalasis - upper lid   Conjunctiva/Sclera White and quiet White and quiet   Cornea mild arcus, tear film debris mild arcus, trace tear film debris   Anterior Chamber Deep and quiet Deep and quiet   Iris Round and dilated Round and dilated   Lens 2-3+ Nuclear sclerosis, 2-3+ Cortical cataract 2-3+ Nuclear sclerosis, 2-3+ Cortical cataract   Anterior Vitreous Mild syneresis, Posterior vitreous detachment, mild vitreous condensations Mild syneresis, Posterior vitreous detachment, mild vitreous condensations         Fundus Exam       Right Left   Disc Pink and Sharp, +cupping, mild PPA Pink and Sharp   C/D Ratio 0.65 0.6   Macula Flat, Blunted foveal reflex, RPE mottling, clumping and early atrophy, rare, fine drusen, No heme or edema Flat, Blunted foveal reflex, RPE mottling, rare, fine drusen, No heme or edema  Vessels attenuated, mild tortuosity attenuated, mild tortuosity   Periphery Attached, mild reticular degeneration, No RT/RD, No heme Attached, focal pigmented CR scars with VR tuft at 0100 -- good laser changes, reticular degeneration, no new RT/RD, No heme           IMAGING AND PROCEDURES  Imaging and Procedures for 03/30/2023  OCT, Retina - OU - Both Eyes       Right Eye Quality was good. Central Foveal Thickness:  277. Progression has been stable. Findings include normal foveal contour, no IRF, no SRF, retinal drusen (Mild drusen).   Left Eye Quality was good. Central Foveal Thickness: 284. Progression has been stable. Findings include normal foveal contour, no IRF, no SRF, retinal drusen (Mild drusen).   Notes *Images captured and stored on drive  Diagnosis / Impression:  Nonexudative ARMD OU -- mild drusen OU NFP, no IRF/SRF OU  Clinical management:  See below  Abbreviations: NFP - Normal foveal profile. CME - cystoid macular edema. PED - pigment epithelial detachment. IRF - intraretinal fluid. SRF - subretinal fluid. EZ - ellipsoid zone. ERM - epiretinal membrane. ORA - outer retinal atrophy. ORT - outer retinal tubulation. SRHM - subretinal hyper-reflective material. IRHM - intraretinal hyper-reflective material             ASSESSMENT/PLAN:    ICD-10-CM   1. Early dry stage nonexudative age-related macular degeneration of both eyes  H35.3131 OCT, Retina - OU - Both Eyes    2. Vitreoretinal tuft of left eye  Q14.1     3. Left retinal defect  H33.302     4. Posterior vitreous detachment of both eyes  H43.813     5. Essential hypertension  I10     6. Hypertensive retinopathy of both eyes  H35.033     7. Combined forms of age-related cataract of both eyes  H25.813      1. Age related macular degeneration, non-exudative, both eyes  - early stage - mild drusen OU - stable  - The incidence, anatomy, and pathology of dry AMD, risk of progression, and the AREDS and AREDS 2 study including smoking risks discussed with patient.  - Recommend amsler grid monitoring  - f/u 9-12 months  2-3 Pigmented CR scar with VR tuft OS  - pt with h/o PVD OS and persistent flashes/floaters  - exam shows pigmented CR scar with VR tuft at 0100  - s/p laser retinopexy OS 11.08.22 -- good laser changes in place  - no new RT/RD - f/u 9-12 months, DFE, OCT  4. PVD / vitreous syneresis OU  -  symptomatic flashes/floaters -- improved  - Discussed findings and prognosis  - No RT or RD on 360 scleral depressed exam  - Reviewed s/s of RT/RD  - Strict return precautions for any such RT/RD signs/symptoms  5,6. Hypertensive retinopathy OU - discussed importance of tight BP control - monitor  7. Mixed Cataract OU - The symptoms of cataract, surgical options, and treatments and risks were discussed with patient. - discussed diagnosis and progression - under the expert management of Dr. Zenaida Niece  Ophthalmic Meds Ordered this visit:  No orders of the defined types were placed in this encounter.     Return for f/u 9-12 months, non-exu ARMD OU, DFE, OCT.  There are no Patient Instructions on file for this visit.  This document serves as a record of services personally performed by Karie Chimera, MD, PhD. It was created on their behalf by Glee Arvin. Manson Passey, OA  an ophthalmic technician. The creation of this record is the provider's dictation and/or activities during the visit.    Electronically signed by: Glee Arvin. Manson Passey, OA 03/31/23 12:56 AM  Karie Chimera, M.D., Ph.D. Diseases & Surgery of the Retina and Vitreous [facility]  I have reviewed the above documentation for accuracy and completeness, and I agree with the above. Karie Chimera, M.D., Ph.D. 03/31/23 12:59 AM   Abbreviations: M myopia (nearsighted); A astigmatism; H hyperopia (farsighted); P presbyopia; Mrx spectacle prescription;  CTL contact lenses; OD right eye; OS left eye; OU both eyes  XT exotropia; ET esotropia; PEK punctate epithelial keratitis; PEE punctate epithelial erosions; DES dry eye syndrome; MGD meibomian gland dysfunction; ATs artificial tears; PFAT's preservative free artificial tears; NSC nuclear sclerotic cataract; PSC posterior subcapsular cataract; ERM epi-retinal membrane; PVD posterior vitreous detachment; RD retinal detachment; DM diabetes mellitus; DR diabetic retinopathy;  NPDR non-proliferative diabetic retinopathy; PDR proliferative diabetic retinopathy; CSME clinically significant macular edema; DME diabetic macular edema; dbh dot blot hemorrhages; CWS cotton wool spot; POAG primary open angle glaucoma; C/D cup-to-disc ratio; HVF humphrey visual field; GVF goldmann visual field; OCT optical coherence tomography; IOP intraocular pressure; BRVO Branch retinal vein occlusion; CRVO central retinal vein occlusion; CRAO central retinal artery occlusion; BRAO branch retinal artery occlusion; RT retinal tear; SB scleral buckle; PPV pars plana vitrectomy; VH Vitreous hemorrhage; PRP panretinal laser photocoagulation; IVK intravitreal kenalog; VMT vitreomacular traction; MH Macular hole;  NVD neovascularization of the disc; NVE neovascularization elsewhere; AREDS age related eye disease study; ARMD age related macular degeneration; POAG primary open angle glaucoma; EBMD epithelial/anterior basement membrane dystrophy; ACIOL anterior chamber intraocular lens; IOL intraocular lens; PCIOL posterior chamber intraocular lens; Phaco/IOL phacoemulsification with intraocular lens placement; PRK photorefractive keratectomy; LASIK laser assisted in situ keratomileusis; HTN hypertension; DM diabetes mellitus; COPD chronic obstructive pulmonary disease

## 2023-03-24 DIAGNOSIS — L821 Other seborrheic keratosis: Secondary | ICD-10-CM | POA: Diagnosis not present

## 2023-03-24 DIAGNOSIS — L578 Other skin changes due to chronic exposure to nonionizing radiation: Secondary | ICD-10-CM | POA: Diagnosis not present

## 2023-03-24 DIAGNOSIS — L820 Inflamed seborrheic keratosis: Secondary | ICD-10-CM | POA: Diagnosis not present

## 2023-03-30 ENCOUNTER — Ambulatory Visit (INDEPENDENT_AMBULATORY_CARE_PROVIDER_SITE_OTHER): Payer: Medicare Other | Admitting: Ophthalmology

## 2023-03-30 ENCOUNTER — Encounter (INDEPENDENT_AMBULATORY_CARE_PROVIDER_SITE_OTHER): Payer: Self-pay | Admitting: Ophthalmology

## 2023-03-30 DIAGNOSIS — R002 Palpitations: Secondary | ICD-10-CM | POA: Diagnosis not present

## 2023-03-30 DIAGNOSIS — B9689 Other specified bacterial agents as the cause of diseases classified elsewhere: Secondary | ICD-10-CM | POA: Diagnosis not present

## 2023-03-30 DIAGNOSIS — H43813 Vitreous degeneration, bilateral: Secondary | ICD-10-CM

## 2023-03-30 DIAGNOSIS — N390 Urinary tract infection, site not specified: Secondary | ICD-10-CM | POA: Diagnosis not present

## 2023-03-30 DIAGNOSIS — E559 Vitamin D deficiency, unspecified: Secondary | ICD-10-CM | POA: Diagnosis not present

## 2023-03-30 DIAGNOSIS — Q141 Congenital malformation of retina: Secondary | ICD-10-CM

## 2023-03-30 DIAGNOSIS — M19071 Primary osteoarthritis, right ankle and foot: Secondary | ICD-10-CM | POA: Diagnosis not present

## 2023-03-30 DIAGNOSIS — M160 Bilateral primary osteoarthritis of hip: Secondary | ICD-10-CM | POA: Diagnosis not present

## 2023-03-30 DIAGNOSIS — H353131 Nonexudative age-related macular degeneration, bilateral, early dry stage: Secondary | ICD-10-CM

## 2023-03-30 DIAGNOSIS — I10 Essential (primary) hypertension: Secondary | ICD-10-CM

## 2023-03-30 DIAGNOSIS — E0590 Thyrotoxicosis, unspecified without thyrotoxic crisis or storm: Secondary | ICD-10-CM | POA: Diagnosis not present

## 2023-03-30 DIAGNOSIS — E785 Hyperlipidemia, unspecified: Secondary | ICD-10-CM | POA: Diagnosis not present

## 2023-03-30 DIAGNOSIS — H35033 Hypertensive retinopathy, bilateral: Secondary | ICD-10-CM | POA: Diagnosis not present

## 2023-03-30 DIAGNOSIS — H33302 Unspecified retinal break, left eye: Secondary | ICD-10-CM

## 2023-03-30 DIAGNOSIS — J208 Acute bronchitis due to other specified organisms: Secondary | ICD-10-CM | POA: Diagnosis not present

## 2023-03-30 DIAGNOSIS — J4540 Moderate persistent asthma, uncomplicated: Secondary | ICD-10-CM | POA: Diagnosis not present

## 2023-03-30 DIAGNOSIS — H25813 Combined forms of age-related cataract, bilateral: Secondary | ICD-10-CM | POA: Diagnosis not present

## 2023-03-30 DIAGNOSIS — Z86718 Personal history of other venous thrombosis and embolism: Secondary | ICD-10-CM | POA: Diagnosis not present

## 2023-03-30 DIAGNOSIS — M19072 Primary osteoarthritis, left ankle and foot: Secondary | ICD-10-CM | POA: Diagnosis not present

## 2023-03-31 ENCOUNTER — Encounter (INDEPENDENT_AMBULATORY_CARE_PROVIDER_SITE_OTHER): Payer: Self-pay | Admitting: Ophthalmology

## 2023-04-03 ENCOUNTER — Other Ambulatory Visit: Payer: Self-pay

## 2023-04-03 DIAGNOSIS — I6522 Occlusion and stenosis of left carotid artery: Secondary | ICD-10-CM

## 2023-04-15 DIAGNOSIS — G4733 Obstructive sleep apnea (adult) (pediatric): Secondary | ICD-10-CM | POA: Diagnosis not present

## 2023-04-15 DIAGNOSIS — J4520 Mild intermittent asthma, uncomplicated: Secondary | ICD-10-CM | POA: Diagnosis not present

## 2023-04-15 DIAGNOSIS — R5383 Other fatigue: Secondary | ICD-10-CM | POA: Diagnosis not present

## 2023-04-16 ENCOUNTER — Ambulatory Visit: Payer: Medicare Other | Admitting: Physician Assistant

## 2023-04-16 ENCOUNTER — Ambulatory Visit (HOSPITAL_COMMUNITY)
Admission: RE | Admit: 2023-04-16 | Discharge: 2023-04-16 | Disposition: A | Discharge status: Home / Self Care | Payer: Medicare Other | Source: Ambulatory Visit | Attending: Vascular Surgery | Admitting: Vascular Surgery

## 2023-04-16 VITALS — BP 133/83 | HR 64 | Temp 98.1°F | Resp 20 | Ht 66.0 in | Wt 192.8 lb

## 2023-04-16 DIAGNOSIS — Z79891 Long term (current) use of opiate analgesic: Secondary | ICD-10-CM | POA: Diagnosis not present

## 2023-04-16 DIAGNOSIS — G894 Chronic pain syndrome: Secondary | ICD-10-CM | POA: Diagnosis not present

## 2023-04-16 DIAGNOSIS — I6523 Occlusion and stenosis of bilateral carotid arteries: Secondary | ICD-10-CM

## 2023-04-16 DIAGNOSIS — Z79899 Other long term (current) drug therapy: Secondary | ICD-10-CM | POA: Diagnosis not present

## 2023-04-16 DIAGNOSIS — I6522 Occlusion and stenosis of left carotid artery: Secondary | ICD-10-CM | POA: Insufficient documentation

## 2023-04-16 DIAGNOSIS — M5416 Radiculopathy, lumbar region: Secondary | ICD-10-CM | POA: Diagnosis not present

## 2023-04-16 DIAGNOSIS — M4316 Spondylolisthesis, lumbar region: Secondary | ICD-10-CM | POA: Diagnosis not present

## 2023-04-16 NOTE — Progress Notes (Signed)
 HISTORY AND PHYSICAL     CC:  follow up. Requesting Provider:  Paulina Fusi, MD  HPI: This is a 73 y.o. female here for follow up for carotid artery stenosis.    Pt has hx of CVA.    She reportedly had a CT scan which showed a stroke in the right brain and had some left hand tingling related to the stroke. When she was originally seen by Dr. Edilia Bo, and she originally had 60-79% on the left and minimal stenosis on the right.    Pt was last seen 04/15/2022 and at that time she was not having any neurological issues.  She has hx of DVT, first in the left leg after a fall and then about a year later, she states she had a DVT in the right leg as well. It was recommended she be on anticoagulation, however she could not afford the Eliquis and takes a baby asa daily   Pt returns today for follow up.    Pt denies any amaurosis fugax, speech difficulties, weakness, numbness, paralysis or clumsiness or facial droop.     The pt is on a statin for cholesterol management.  The pt is on a daily aspirin.   Other AC:  none The pt is on diuretic for hypertension.   The pt is not on medication for diabetes Tobacco hx:  never   Past Medical History:  Diagnosis Date   Acute deep vein thrombosis (DVT) of left peroneal vein 04/11/2019   Aortic stenosis 04/12/2021   Basal cell carcinoma 07/25/2021   Cervical spondylosis 03/18/2018   Chronic bilateral low back pain without sciatica 03/18/2018   Chronic foot pain, left 03/14/2019   Possible fracture base 2nd metatarsal   Chronic neck pain 03/18/2018   COPD (chronic obstructive pulmonary disease)    DDD (degenerative disc disease), cervical 03/18/2018   Displaced fracture of fifth metatarsal bone, right foot 03/2020   Dyspnea on exertion 01/27/2018   Facet arthritis, degenerative, lumbar spine 03/18/2018   Family history of malignant neoplasm of breast    Generalized anxiety disorder 01/24/2019   Possible history of panic attacks   Genetic  testing 11/14/2020   Negative genetic testing on the BreastNext panel.  PALB2 p.L100F VUS.  The BreastNext gene panel offered by [facility] includes sequencing and rearrangement analysis for the following 17 genes:  ATM, BARD1, BRCA1, BRCA2, BRIP1, CDH1, CHEK2, MRE11A, MUTYH, NBN, NF1, PALB2, PTEN, RAD50, RAD51C, RAD51D, and TP53.   The report date was 11/18/2013.     UPDATE: PALB2 p.L100F has been reclassified to Likely B   Greater trochanteric bursitis of both hips 04/10/2018   Heart murmur 01/27/2018   Left carotid artery stenosis 07/03/2022   Left leg swelling 02/23/2019   Lumbar spondylosis 03/18/2018   Malignant neoplasm of central portion of left female breast    S/p bilateral mastectomy   Mild cognitive impairment of uncertain or unknown etiology 03/20/2022   Mitral valve prolapse 01/27/2018   Neuropathy 06/17/2021   Obstructive sleep apnea    Variable CPAP use.   Osteopenia after menopause 12/21/2020   Other idiopathic scoliosis, lumbar region 03/18/2018   Pain of left calf 03/14/2019   Palpitations 01/25/2018   Peripheral vascular disease, unspecified 10/29/2020   up to 69% stenosis on the lef internal carotic artery based on cardiac ultrasounds from June 2022   Pes planus of left foot 02/23/2019   Pinched nerve 02/10/2021   Sprain of tarsometatarsal ligament of right foot 07/14/2019   Stasis  dermatitis, acute 10/03/2019   Tenosynovitis of left ankle 04/11/2019   TIA (transient ischemic attack) 01/17/2019   Urge incontinence 07/28/2019   Varicose veins of left lower extremity    Ventricular tachycardia 02/28/2019    Past Surgical History:  Procedure Laterality Date   basal cell removed     Head   BUNIONECTOMY Right 1999   eye lid lift Bilateral    done July 2024   MASTECTOMY Bilateral 2012   NASAL SEPTUM SURGERY  1996   SKIN CANCER EXCISION  2018   Top of head    SKIN CANCER EXCISION  07/2019   Nose   TENDON TRANSFER Right 1999   foot   TONGUE BIOPSY  12/04/2021    Dr Marcheta Grammes    Allergies  Allergen Reactions   Clarithromycin Diarrhea and Other (See Comments)   Prednisone Other (See Comments)    Passed out Other reaction(s): other   Sertraline Diarrhea   Sertraline Hcl Diarrhea   Duloxetine Hcl Itching, Nausea Only, Other (See Comments), Rash and Swelling    Current Outpatient Medications  Medication Sig Dispense Refill   ascorbic acid (VITAMIN C) 500 MG tablet Take 500 mg by mouth daily.     aspirin 81 MG chewable tablet Chew 81 mg by mouth daily.     atorvastatin (LIPITOR) 80 MG tablet Take 1 tablet (80 mg total) by mouth daily. 90 tablet 2   calcium carbonate (SUPER CALCIUM) 1500 (600 Ca) MG TABS tablet Take 1 tablet by mouth daily.     cephALEXin (KEFLEX) 250 MG capsule Take 250 mg by mouth at bedtime.     Cholecalciferol 25 MCG (1000 UT) capsule Take 1,000 Units by mouth daily.     dicyclomine (BENTYL) 10 MG capsule Take 10 mg by mouth as needed for spasms (IBS).     diphenoxylate-atropine (LOMOTIL) 2.5-0.025 MG tablet Take 1 tablet by mouth as needed for diarrhea or loose stools.     donepezil (ARICEPT) 10 MG tablet Take 10 mg by mouth at bedtime.     dorzolamide (TRUSOPT) 2 % ophthalmic solution Place 1 drop into the right eye 2 (two) times daily.     ergocalciferol (VITAMIN D2) 50000 units capsule Take 50,000 Units by mouth once a week.     ezetimibe (ZETIA) 10 MG tablet Take 1 tablet (10 mg total) by mouth daily. 90 tablet 3   furosemide (LASIX) 20 MG tablet Take 20 mg by mouth daily.     HYDROcodone-acetaminophen (NORCO) 7.5-325 MG tablet Take 1 tablet by mouth 3 (three) times daily.     loratadine (CLARITIN) 10 MG tablet Take 10 mg by mouth daily as needed for allergies.     memantine (NAMENDA) 5 MG tablet Take 5 mg by mouth 2 (two) times daily.     metoprolol succinate (TOPROL-XL) 25 MG 24 hr tablet TAKE 1 TABLET BY MOUTH EVERY DAY (Patient taking differently: Take 25 mg by mouth daily.) 90 tablet 2   montelukast (SINGULAIR) 10 MG tablet  Take 10 mg by mouth at bedtime.     Multiple Vitamins-Minerals (PRESERVISION AREDS 2+MULTI VIT PO) Take 1 tablet by mouth daily.     neomycin-polymyxin b-dexamethasone (MAXITROL) 3.5-10000-0.1 OINT Place 1 Application into both eyes 3 (three) times daily.     ondansetron (ZOFRAN) 4 MG tablet Take 4 mg by mouth every 8 (eight) hours as needed for nausea or vomiting.     solifenacin (VESICARE) 10 MG tablet Take 10 mg by mouth daily.  tamsulosin (FLOMAX) 0.4 MG CAPS capsule Take 0.4 mg by mouth daily after supper.     tiZANidine (ZANAFLEX) 4 MG tablet Take 4 mg by mouth every 8 (eight) hours as needed for muscle spasms.     Triamcinolone Acetonide (KENALOG-80 IJ) Inject 1 Dose as directed once.     vitamin B-12 (CYANOCOBALAMIN) 1000 MCG tablet Take 1,000 mcg by mouth daily.     No current facility-administered medications for this visit.    Family History  Problem Relation Age of Onset   Lymphoma Mother 68       deceased 76   Breast cancer Maternal Aunt        Dx 52s; Deceased 45s   Lung cancer Maternal Uncle    Stomach cancer Paternal Uncle        deceased 18   Liver cancer Maternal Uncle    Prostate cancer Other        3 mat cousins   Breast cancer Other 65       mother's mat half-sister   Emphysema Father     Social History   Socioeconomic History   Marital status: Single    Spouse name: Not on file   Number of children: 0   Years of education: 16   Highest education level: Bachelor's degree (e.g., BA, AB, BS)  Occupational History   Occupation: Retired  Tobacco Use   Smoking status: Never   Smokeless tobacco: Never  Vaping Use   Vaping status: Never Used  Substance and Sexual Activity   Alcohol use: Not Currently   Drug use: Never   Sexual activity: Not Currently  Other Topics Concern   Not on file  Social History Narrative   Pt is single she lives alone has no children   Right handed   Social Drivers of Corporate investment banker Strain: Not on file   Food Insecurity: Not on file  Transportation Needs: Not on file  Physical Activity: Not on file  Stress: Not on file  Social Connections: Not on file  Intimate Partner Violence: Not on file     REVIEW OF SYSTEMS:   [X]  denotes positive finding, [ ]  denotes negative finding Cardiac  Comments:  Chest pain or chest pressure:    Shortness of breath upon exertion:    Short of breath when lying flat:    Irregular heart rhythm:        Vascular    Pain in calf, thigh, or hip brought on by ambulation:    Pain in feet at night that wakes you up from your sleep:     Blood clot in your veins: x Hx-see HPI  Leg swelling:         Pulmonary    Oxygen at home:    Productive cough:     Wheezing:         Neurologic    Sudden weakness in arms or legs:     Sudden numbness in arms or legs:     Sudden onset of difficulty speaking or slurred speech:    Temporary loss of vision in one eye:     Problems with dizziness:         Gastrointestinal    Blood in stool:     Vomited blood:         Genitourinary    Burning when urinating:     Blood in urine:        Psychiatric    Major depression:  Hematologic    Bleeding problems:    Problems with blood clotting too easily:        Skin    Rashes or ulcers:        Constitutional    Fever or chills:      PHYSICAL EXAMINATION:  Today's Vitals   04/16/23 1041  BP: 133/83  Pulse: 64  Resp: 20  Temp: 98.1 F (36.7 C)  TempSrc: Temporal  SpO2: 96%  Weight: 192 lb 12.8 oz (87.5 kg)  Height: 5\' 6"  (1.676 m)   Body mass index is 31.12 kg/m.   General:  WDWN in NAD; vital signs documented above Gait: Not observed HENT: WNL, normocephalic Pulmonary: normal non-labored breathing Cardiac: regular HR, without carotid bruits Abdomen: soft, NT; aortic pulse is not palpable Skin: without rashes Vascular Exam/Pulses:  Right Left  Radial 2+ (normal) 2+ (normal)   Extremities: without open wounds Musculoskeletal: no muscle  wasting or atrophy  Neurologic: A&O X 3; moving all extremities equally; speech is fluent/normal Psychiatric:  The pt has Normal affect.   Non-Invasive Vascular Imaging:   Carotid Duplex on 04/16/2023 Right:  1-39% ICA stenosis Left:  1-39% ICA stenosis Left Carotid: Velocities in the left ICA are consistent with a 1-39% stenosis. Elevated velocities at areas of tortuosity in the proximal ICA with no plaque visualized.   Vertebrals:  Bilateral vertebral arteries demonstrate antegrade flow.  Subclavians: Normal flow hemodynamics were seen in bilateral subclavian arteries.   Previous Carotid duplex on 04/15/2022: Right: no evidence of stenosis Left:   40-59% ICA stenosis    ASSESSMENT/PLAN:: 74 y.o. female here for follow up carotid artery stenosis  -duplex today reveals 1-39% bilateral ICA stenosis.  Left has decreased but this could be due to the tortuosity of the proximal ICA.  She remains asymptomatic -discussed s/s of stroke with pt and she understands should she develop any of these sx, she will go to the nearest ER or call 911. -pt will f/u in one year with carotid duplex -pt will call sooner should she have any issues. -continue statin/asa    Doreatha Massed, [facility] [phone]  Clinic MD:  Karin Lieu

## 2023-04-29 DIAGNOSIS — N952 Postmenopausal atrophic vaginitis: Secondary | ICD-10-CM | POA: Diagnosis not present

## 2023-04-29 DIAGNOSIS — N3021 Other chronic cystitis with hematuria: Secondary | ICD-10-CM | POA: Diagnosis not present

## 2023-04-29 DIAGNOSIS — N130 Hydronephrosis with ureteropelvic junction obstruction: Secondary | ICD-10-CM | POA: Diagnosis not present

## 2023-04-30 ENCOUNTER — Ambulatory Visit: Attending: Cardiology

## 2023-04-30 ENCOUNTER — Encounter: Payer: Self-pay | Admitting: Cardiology

## 2023-04-30 ENCOUNTER — Ambulatory Visit: Attending: Cardiology | Admitting: Cardiology

## 2023-04-30 VITALS — BP 124/76 | HR 69 | Ht 64.0 in | Wt 191.6 lb

## 2023-04-30 DIAGNOSIS — I6522 Occlusion and stenosis of left carotid artery: Secondary | ICD-10-CM | POA: Diagnosis present

## 2023-04-30 DIAGNOSIS — R0600 Dyspnea, unspecified: Secondary | ICD-10-CM | POA: Diagnosis present

## 2023-04-30 DIAGNOSIS — I472 Ventricular tachycardia, unspecified: Secondary | ICD-10-CM | POA: Diagnosis present

## 2023-04-30 DIAGNOSIS — R011 Cardiac murmur, unspecified: Secondary | ICD-10-CM

## 2023-04-30 DIAGNOSIS — R002 Palpitations: Secondary | ICD-10-CM | POA: Diagnosis present

## 2023-04-30 DIAGNOSIS — I2510 Atherosclerotic heart disease of native coronary artery without angina pectoris: Secondary | ICD-10-CM | POA: Diagnosis present

## 2023-04-30 NOTE — Progress Notes (Signed)
 Cardiology Office Note:    Date:  04/30/2023   ID:  Karen Shannon, DOB [birth year], MRN [id number]  PCP:  Paulina Fusi, MD   [facility] Providers Cardiologist:  Gypsy Balsam, MD     Referring MD: Paulina Fusi, MD     History of Present Illness:    Karen Shannon is a 73 y.o. female with a hx of nonobstructive CAD per coronary CTA in 2024, DVT--could not afford Eliquis and takes aspirin, carotid artery stenosis followed by [facility]VS, mitral valve prolapse, TIA, ventricular tachycardia, PVD, aortic stenosis, OSA on CPAP, COPD, breast cancer 2012, palpitations.  04/16/2023 carotid duplex mild bilateral carotid artery stenosis 05/2022 coronary CTA calcium score of 215 revealing mild nonobstructive CAD. 04/15/2022 carotid duplex revealed 40 to 50% left ICA stenosis, right normal without stenosis. 11/01/2021 echo EF 55 to 60%, grade 2 DD, no evidence of mitral valve regurgitation, mild thickening of the aortic valve. 04/05/2019 lexiscan normal, low risk 02/20/2019 average HR 78 bpm, 3 episodes of NSVT, SVT  She initially established care with Dr. Bing Matter on 01/27/2018 at the behest of her PCP for evaluation of palpitations and dizziness.  Previously she had been seen by Dr. Sherlyn Lick, was diagnosed with MVP and experienced palpitations at that time as well.  Echo at this time revealed an EF of 55 to 60%, grade 1 DD.  She wore a Holter monitor in January 2020 which revealed normal sinus rhythm, infrequent PVCs and PACs.  Repeat monitor following CVA in 2021 revealed 3 episodes of ventricular tachycardia lasting 12 beats, however she was asymptomatic.  She underwent a stress test for further evaluation of her ventricular tachycardia, study was normal, low risk.   Most recently evaluated by myself on 09/01/2022, she was stable from a cardiac perspective, trying to increase her physical activity, no changes were made to her plan of care and she was advised to follow-up in 6  months.  She presents today for follow-up, she has been bothered by worsening DOE and palpitations.  Regarding her DOE, is hard for her to decipher if it is related to recent COVID infection as well as bronchitis.  Her palpitations seem to have changed with her duration, she states sometimes the palpitations last for hours. She denies chest pain, dyspnea, pnd, orthopnea, n, v, dizziness, syncope, edema, weight gain, or early satiety.   EKG Interpretation Date/Time:  Thursday April 30 2023 15:50:10 EDT Ventricular Rate:  69 PR Interval:  202 QRS Duration:  90 QT Interval:  436 QTC Calculation: 467 R Axis:   62  Text Interpretation: Normal sinus rhythm Normal ECG When compared with ECG of 21-Oct-2021 16:42, No significant change was found Confirmed by Wallis Bamberg [phone]) on 04/30/2023 4:04:59 PM    Past Medical History:  Diagnosis Date   Acute deep vein thrombosis (DVT) of left peroneal vein 04/11/2019   Aortic stenosis 04/12/2021   Basal cell carcinoma 07/25/2021   Basal cell carcinoma (BCC) in situ of skin 02/14/2021   Had several places of basal cell carcinoma removed on top of head over the last 2 to 3 years.     Cervical spondylosis 03/18/2018   Chronic bilateral low back pain without sciatica 03/18/2018   Chronic foot pain, left 03/14/2019   Possible fracture base 2nd metatarsal   Chronic neck pain 03/18/2018   COPD (chronic obstructive pulmonary disease)    DDD (degenerative disc disease), cervical 03/18/2018   Displaced fracture of fifth metatarsal bone, right foot 03/2020  DVT femoral (deep venous thrombosis) with thrombophlebitis, left (HCC) 04/11/2019   Dyspnea on exertion 01/27/2018   Facet arthritis, degenerative, lumbar spine 03/18/2018   Family history of malignant neoplasm of breast    Generalized anxiety disorder 01/24/2019   Possible history of panic attacks   Genetic testing 11/14/2020   Negative genetic testing on the BreastNext panel.  PALB2 p.L100F VUS.   The BreastNext gene panel offered by [employer] includes sequencing and rearrangement analysis for the following 17 genes:  ATM, BARD1, BRCA1, BRCA2, BRIP1, CDH1, CHEK2, MRE11A, MUTYH, NBN, NF1, PALB2, PTEN, RAD50, RAD51C, RAD51D, and TP53.   The report date was 11/18/2013.     UPDATE: PALB2 p.L100F has been reclassified to Likely B   Greater trochanteric bursitis of both hips 04/10/2018   Heart murmur 01/27/2018   History of breast cancer 03/18/2018   Formatting of this note might be different from the original. S/p bilateral mastectomy     History of deep vein thrombosis (DVT) of lower extremity 10/02/2019   Hypertrophic and atrophic condition of skin 07/03/2021   Left carotid artery stenosis 07/03/2022   Left leg swelling 02/23/2019   Lumbar spondylosis 03/18/2018   Malignant neoplasm of central portion of left female breast    S/p bilateral mastectomy   Malignant neoplasm of female breast (HCC) 02/10/2009   Genetic testing neg; triple neg; S/P bilat mastectomy     Mild cognitive impairment of uncertain or unknown etiology 03/20/2022   Mitral valve prolapse 01/27/2018   Neuropathy 06/17/2021   Obstructive sleep apnea    Variable CPAP use.   Osteopenia after menopause 12/21/2020   Other idiopathic scoliosis, lumbar region 03/18/2018   Pain of left calf 03/14/2019   Palpitations 01/25/2018   Peripheral vascular disease, unspecified 10/29/2020   up to 69% stenosis on the lef internal carotic artery based on cardiac ultrasounds from June 2022   Personal history of colon polyps, unspecified 03/16/2023   Pes planus of left foot 02/23/2019   Pinched nerve 02/10/2021   Ptosis of both upper eyelids 08/27/2022   Had medical necessary surgery for droopy eyelids as a result of loss of peripheral vision in both eyes.     Rib pain on right side 01/21/2023   Right lumbar radiculopathy 03/19/2021   Sprain of tarsometatarsal ligament of right foot 07/14/2019   Stasis dermatitis, acute 10/03/2019    Tenosynovitis of left ankle 04/11/2019   TIA (transient ischemic attack) 01/17/2019   Tongue adhesions, congenital 12/04/2021   Dr. Marcheta Grammes had suspicions that I may tongue cancer. I had biopsy of tongue. Was only a cyst. No cancer.     Urge incontinence 07/28/2019   Varicose veins of left lower extremity    Ventricular tachycardia 02/28/2019    Past Surgical History:  Procedure Laterality Date   basal cell removed     Head   BUNIONECTOMY Right 1999   eye lid lift Bilateral    done July 2024   MASTECTOMY Bilateral 2012   NASAL SEPTUM SURGERY  1996   SKIN CANCER EXCISION  2018   Top of head    SKIN CANCER EXCISION  07/2019   Nose   TENDON TRANSFER Right 1999   foot   TONGUE BIOPSY  12/04/2021   Dr Marcheta Grammes    Current Medications: Current Meds  Medication Sig   ascorbic acid (VITAMIN C) 500 MG tablet Take 500 mg by mouth daily.   aspirin 81 MG chewable tablet Chew 81 mg by mouth daily.   atorvastatin (LIPITOR)  80 MG tablet Take 1 tablet (80 mg total) by mouth daily.   calcium carbonate (SUPER CALCIUM) 1500 (600 Ca) MG TABS tablet Take 1 tablet by mouth daily.   cephALEXin (KEFLEX) 250 MG capsule Take 250 mg by mouth at bedtime.   Cholecalciferol 25 MCG (1000 UT) capsule Take 1,000 Units by mouth daily.   dicyclomine (BENTYL) 10 MG capsule Take 10 mg by mouth as needed for spasms (IBS).   diphenoxylate-atropine (LOMOTIL) 2.5-0.025 MG tablet Take 1 tablet by mouth as needed for diarrhea or loose stools.   donepezil (ARICEPT) 10 MG tablet Take 10 mg by mouth at bedtime.   dorzolamide (TRUSOPT) 2 % ophthalmic solution Place 1 drop into the right eye 2 (two) times daily.   ergocalciferol (VITAMIN D2) 50000 units capsule Take 50,000 Units by mouth once a week.   ezetimibe (ZETIA) 10 MG tablet Take 1 tablet (10 mg total) by mouth daily.   furosemide (LASIX) 20 MG tablet Take 20 mg by mouth daily.   HYDROcodone-acetaminophen (NORCO) 7.5-325 MG tablet Take 1 tablet by mouth 3 (three) times  daily.   loratadine (CLARITIN) 10 MG tablet Take 10 mg by mouth daily as needed for allergies.   memantine (NAMENDA) 5 MG tablet Take 5 mg by mouth 2 (two) times daily.   metoprolol succinate (TOPROL-XL) 25 MG 24 hr tablet TAKE 1 TABLET BY MOUTH EVERY DAY (Patient taking differently: Take 25 mg by mouth daily.)   montelukast (SINGULAIR) 10 MG tablet Take 10 mg by mouth at bedtime.   Multiple Vitamins-Minerals (PRESERVISION AREDS 2+MULTI VIT PO) Take 1 tablet by mouth daily.   neomycin-polymyxin b-dexamethasone (MAXITROL) 3.5-10000-0.1 OINT Place 1 Application into both eyes 3 (three) times daily.   ondansetron (ZOFRAN) 4 MG tablet Take 4 mg by mouth every 8 (eight) hours as needed for nausea or vomiting.   senna (SENOKOT) 8.6 MG tablet Take 1 tablet by mouth daily.   solifenacin (VESICARE) 10 MG tablet Take 10 mg by mouth daily.   tamsulosin (FLOMAX) 0.4 MG CAPS capsule Take 0.4 mg by mouth daily after supper.   tiZANidine (ZANAFLEX) 4 MG tablet Take 4 mg by mouth every 8 (eight) hours as needed for muscle spasms.   Triamcinolone Acetonide (KENALOG-80 IJ) Inject 1 Dose as directed once.   vitamin B-12 (CYANOCOBALAMIN) 1000 MCG tablet Take 1,000 mcg by mouth daily.   Zinc Sulfate 66 MG TABS Take 66 mg by mouth daily.     Allergies:   Clarithromycin, Prednisone, Sertraline, Sertraline hcl, Duloxetine, and Duloxetine hcl   Social History   Socioeconomic History   Marital status: Single    Spouse name: Not on file   Number of children: 0   Years of education: 16   Highest education level: Bachelor's degree (e.g., BA, AB, BS)  Occupational History   Occupation: Retired  Tobacco Use   Smoking status: Never   Smokeless tobacco: Never  Vaping Use   Vaping status: Never Used  Substance and Sexual Activity   Alcohol use: Not Currently   Drug use: Never   Sexual activity: Not Currently  Other Topics Concern   Not on file  Social History Narrative   Pt is single she lives alone has no  children   Right handed   Social Drivers of Corporate investment banker Strain: Not on file  Food Insecurity: Not on file  Transportation Needs: Not on file  Physical Activity: Not on file  Stress: Not on file  Social Connections: Not on  file     Family History: [name] history includes Breast cancer in her maternal aunt; Breast cancer (age of onset: 19) in an other family member; Emphysema in her father; Liver cancer in her maternal uncle; Lung cancer in her maternal uncle; Lymphoma (age of onset: 69) in her mother; Prostate cancer in an other family member; Stomach cancer in her paternal uncle.  ROS:   Please see the history of present illness.     All other systems reviewed and are negative.  EKGs/Labs/Other Studies Reviewed:    The following studies were reviewed today: Cardiac Studies & Procedures   ______________________________________________________________________________________________   STRESS TESTS  MYOCARDIAL PERFUSION IMAGING 04/05/2019  Narrative  The left ventricular ejection fraction is hyperdynamic (>65%).  Nuclear stress EF: 66%.  There was no ST segment deviation noted during stress.  Defect 1: There is a defect present in the basal inferior and mid inferior location. Suggests diaphragmatic attenuation.  The study is normal.  This is a low risk study.   ECHOCARDIOGRAM  ECHOCARDIOGRAM COMPLETE 11/01/2021  Narrative ECHOCARDIOGRAM REPORT    Patient Name:   Yvetta ANN Flaugher Date of Exam: 11/01/2021 Medical Rec #:  [id number]            Height:       65.5 in Accession #:    [id number]           Weight:       188.4 lb Date of Birth:  [birth year]             BSA:          1.940 m Patient Age:    71 years             BP:           124/78 mmHg Patient Gender: F                    HR:           65 bpm. Exam Location:  [facility]  Procedure: 2D Echo, Cardiac Doppler, Color Doppler and Strain Analysis  Indications:    Ventricular  tachycardia (HCC) [I47.20 (ICD-10-CM)]; Peripheral vascular disease, unspecified (HCC) up to 69% stenosis on the lef internal carotic artery based on cardiac ultrasounds from June 2022 t [I73.9 (ICD-10-CM)]; Lumbar spondylosis C978821 (ICD-10-CM)]; Palpitations [R00.2 (ICD-10-CM)]; Late effect of cerebrovascular accident (CVA) right basal ganglion noted on MRI in June 2022 felt to be small vessel disease but [I69.30 (ICD-10-CM)]; Mitral valve prolapse [I34.1 (ICD-10-CM)]  History:        Patient has prior history of Echocardiogram examinations, most recent 07/23/2020. TIA and COPD, Aortic Valve Disease; Signs/Symptoms:Dyspnea.  Sonographer:    Margreta Journey RDCS Referring Phys: [id number] ROBERT J KRASOWSKI  IMPRESSIONS   1. GLS -15.7. Left ventricular ejection fraction, by estimation, is 55 to 60%. The left ventricle has normal function. The left ventricle has no regional wall motion abnormalities. Left ventricular diastolic parameters are consistent with Grade II diastolic dysfunction (pseudonormalization). 2. Right ventricular systolic function is normal. The right ventricular size is normal. There is normal pulmonary artery systolic pressure. 3. The mitral valve is normal in structure. No evidence of mitral valve regurgitation. No evidence of mitral stenosis. 4. The aortic valve is normal in structure. There is mild thickening of the aortic valve. Aortic valve regurgitation is not visualized. Mild aortic valve stenosis. Aortic valve mean gradient measures 16.0 mmHg. 5. The inferior vena cava is normal in size with greater than  50% respiratory variability, suggesting right atrial pressure of 3 mmHg.  FINDINGS Left Ventricle: GLS -15.7. Left ventricular ejection fraction, by estimation, is 55 to 60%. The left ventricle has normal function. The left ventricle has no regional wall motion abnormalities. The left ventricular internal cavity size was normal in size. There is no left  ventricular hypertrophy. Left ventricular diastolic parameters are consistent with Grade II diastolic dysfunction (pseudonormalization).  Right Ventricle: The right ventricular size is normal. No increase in right ventricular wall thickness. Right ventricular systolic function is normal. There is normal pulmonary artery systolic pressure. The tricuspid regurgitant velocity is 2.28 m/s, and with an assumed right atrial pressure of 8 mmHg, the estimated right ventricular systolic pressure is 28.8 mmHg.  Left Atrium: Left atrial size was normal in size.  Right Atrium: Right atrial size was normal in size.  Pericardium: There is no evidence of pericardial effusion.  Mitral Valve: The mitral valve is normal in structure. No evidence of mitral valve regurgitation. No evidence of mitral valve stenosis.  Tricuspid Valve: The tricuspid valve is normal in structure. Tricuspid valve regurgitation is mild . No evidence of tricuspid stenosis.  Aortic Valve: The aortic valve is normal in structure. There is mild thickening of the aortic valve. Aortic valve regurgitation is not visualized. Mild aortic stenosis is present. Aortic valve mean gradient measures 16.0 mmHg. Aortic valve peak gradient measures 25.4 mmHg. Aortic valve area, by VTI measures 0.97 cm.  Pulmonic Valve: The pulmonic valve was normal in structure. Pulmonic valve regurgitation is not visualized. No evidence of pulmonic stenosis.  Aorta: The aortic root is normal in size and structure.  Venous: The inferior vena cava is normal in size with greater than 50% respiratory variability, suggesting right atrial pressure of 3 mmHg.  IAS/Shunts: No atrial level shunt detected by color flow Doppler.   LEFT VENTRICLE PLAX 2D LVIDd:         3.00 cm   Diastology LVIDs:         2.20 cm   LV e' medial:    5.77 cm/s LV PW:         1.10 cm   LV E/e' medial:  18.9 LV IVS:        1.10 cm   LV e' lateral:   7.07 cm/s LVOT diam:     1.90 cm   LV E/e'  lateral: 15.4 LV SV:         56 LV SV Index:   29 LVOT Area:     2.84 cm   RIGHT VENTRICLE             IVC RV Basal diam:  3.00 cm     IVC diam: 2.50 cm RV S prime:     11.20 cm/s TAPSE (M-mode): 2.3 cm  LEFT ATRIUM             Index        RIGHT ATRIUM           Index LA diam:        2.60 cm 1.34 cm/m   RA Area:     14.50 cm LA Vol (A2C):   53.2 ml 27.43 ml/m  RA Volume:   33.00 ml  17.01 ml/m LA Vol (A4C):   38.7 ml 19.95 ml/m LA Biplane Vol: 45.7 ml 23.56 ml/m AORTIC VALVE AV Area (Vmax):    1.02 cm AV Area (Vmean):   0.87 cm AV Area (VTI):     0.97 cm AV Vmax:  252.00 cm/s AV Vmean:          192.000 cm/s AV VTI:            0.579 m AV Peak Grad:      25.4 mmHg AV Mean Grad:      16.0 mmHg LVOT Vmax:         90.50 cm/s LVOT Vmean:        58.900 cm/s LVOT VTI:          0.199 m LVOT/AV VTI ratio: 0.34  AORTA Ao Root diam: 3.40 cm Ao Asc diam:  3.40 cm  MITRAL VALVE                TRICUSPID VALVE MV Area (PHT): 3.99 cm     TR Peak grad:   20.8 mmHg MV Decel Time: 190 msec     TR Vmax:        228.00 cm/s MV E velocity: 109.00 cm/s MV A velocity: 120.00 cm/s  SHUNTS MV E/A ratio:  0.91         Systemic VTI:  0.20 m Systemic Diam: 1.90 cm  Gypsy Balsam MD Electronically signed by Gypsy Balsam MD Signature Date/Time: 11/02/2021/12:03:57 PM    Final    MONITORS  LONG TERM MONITOR (3-14 DAYS) 02/17/2019  Narrative Inez Pilgrim, DOB 1950-09-27, MRN [id number]  HOLTER MONITOR REPORT:    Date of test:                 01/22/2019 Duration of test:           13 Indication:                    Palpitations Ordering physician:  Georgeanna Lea, MD Referring physician:  Georgeanna Lea, MD   Baseline rhythm: Sinus  Minimum heart rate: 52 BPM.  Average heart rate: 78 BPM.  Maximal heart rate 869 BPM.  Atrial arrhythmia: Total of 11 narrow complex tachycardia fastest episode 16 beats at 154 bpm  Ventricular arrhythmia: 3  episodes of ventricular tachycardia longest and fastest 12 beats at rate of 169  Conduction abnormality: None  Symptoms: Multiple trigger events for PVCs   Conclusion: Ventricular tachycardia: Total 3 episodes.  Longest 12 beats.  Asymptomatic. 11 narrow complex tachycardias Symptomatic PVCs  Interpreting  cardiologist: Gypsy Balsam, MD Date: 02/20/2019 9:07 PM       ______________________________________________________________________________________________       EKG:  EKG is not ordered today.    Recent Labs: 05/28/2022: BUN 17; Creatinine, Ser 0.80; Potassium 4.4; Sodium 142 07/30/2022: ALT 23  Recent Lipid Panel    Component Value Date/Time   CHOL 127 07/30/2022 0941   TRIG 49 07/30/2022 0941   HDL 53 07/30/2022 0941   CHOLHDL 2.4 07/30/2022 0941   LDLCALC 63 07/30/2022 0941   LDLDIRECT 89 10/29/2020 1038     Risk Assessment/Calculations:                Physical Exam:    VS:  BP 124/76   Pulse 69   Ht 5\' 4"  (1.626 m)   Wt 191 lb 9.6 oz (86.9 kg)   SpO2 95%   BMI 32.89 kg/m     Wt Readings from Last 3 Encounters:  04/30/23 191 lb 9.6 oz (86.9 kg)  04/16/23 192 lb 12.8 oz (87.5 kg)  01/21/23 189 lb (85.7 kg)     GEN:  Well nourished, well developed in no acute distress HEENT: Normal NECK: No JVD;  No carotid bruits LYMPHATICS: No lymphadenopathy CARDIAC: RRR, 2/6 systolic murmur RESPIRATORY:  Clear to auscultation without rales, wheezing or rhonchi  ABDOMEN: Soft, non-tender, non-distended MUSCULOSKELETAL:  No edema; No deformity  SKIN: Warm and dry NEUROLOGIC:  Alert and oriented x 3 PSYCHIATRIC:  Normal affect   ASSESSMENT:    1. Ventricular tachycardia (HCC)   2. Palpitations   3. Carotid stenosis, left   4. Dyspnea, unspecified type     PLAN:    In order of problems listed above:  CAD -most recent stress test in 2021 was unrevealing for ischemia.  We arranged for coronary CTA which revealed nonobstructive CAD.  Continue  aspirin 81 mg daily, continue metoprolol 25 mg daily.   Ventricular tachycardia/palpitations -palpitations have been increasing in duration, at times lasting hours, will need to repeat monitor to evaluate source of palpitations.  Continue metoprolol 25 mg daily.  Carotid artery stenosis -most recent carotid duplex on 04/16/2023 mild bilateral carotid artery stenosis, followed by VVS.  HLD-most recent LDL on 07/30/22 was 63, continue Lipitor 80 mg daily.    Murmur-2/6 systolic murmur, will repeat echocardiogram  DOE-will repeat echocardiogram  Disposition-echocardiogram, 2-week ZIO monitor, return in 6 months.    Medication Adjustments/Labs and Tests Ordered: Current medicines are reviewed at length with the patient today.  Concerns regarding medicines are outlined above.  Orders Placed This Encounter  Procedures   LONG TERM MONITOR (3-14 DAYS)   EKG 12-Lead   ECHOCARDIOGRAM COMPLETE   No orders of the defined types were placed in this encounter.   Patient Instructions  Medication Instructions:  Your physician recommends that you continue on your current medications as directed. Please refer to the Current Medication list given to you today.   *If you need a refill on your cardiac medications before your next appointment, please call your pharmacy*   Lab Work: NONE If you have labs (blood work) drawn today and your tests are completely normal, you will receive your results only by: MyChart Message (if you have MyChart) OR A paper copy in the mail If you have any lab test that is abnormal or we need to change your treatment, we will call you to review the results.   Testing/Procedures: You have been asked to wear a Zio Heart Monitor today. It is to be worn for 14 days. Please remove the monitor on 4/3 and mail back in the box provided.  If you have any questions about the monitor please call the company at [phone]    Your physician has requested that you have an  echocardiogram. Echocardiography is a painless test that uses sound waves to create images of your heart. It provides your doctor with information about the size and shape of your heart and how well your heart's chambers and valves are working. This procedure takes approximately one hour. There are no restrictions for this procedure. Please do NOT wear cologne, perfume, aftershave, or lotions (deodorant is allowed). Please arrive 15 minutes prior to your appointment time.  Please note: We ask at that you not bring children with you during ultrasound (echo/ vascular) testing. Due to room size and safety concerns, children are not allowed in the ultrasound rooms during exams. Our front office staff cannot provide observation of children in our lobby area while testing is being conducted. An adult accompanying a patient to their appointment will only be allowed in the ultrasound room at the discretion of the ultrasound technician under special circumstances. We apologize for any inconvenience.  Follow-Up: At [facility], you and your health needs are our priority.  As part of our continuing mission to provide you with exceptional heart care, we have created designated Provider Care Teams.  These Care Teams include your primary Cardiologist (physician) and Advanced Practice Providers (APPs -  Physician Assistants and Nurse Practitioners) who all work together to provide you with the care you need, when you need it.  We recommend signing up for the patient portal called "MyChart".  Sign up information is provided on this After Visit Summary.  MyChart is used to connect with patients for Virtual Visits (Telemedicine).  Patients are able to view lab/test results, encounter notes, upcoming appointments, etc.  Non-urgent messages can be sent to your provider as well.   To learn more about what you can do with MyChart, go to [email or web address].    Your next appointment:   6 month(s)  Provider:    Gypsy Balsam, MD    Other Instructions        Signed, Flossie Dibble, NP  04/30/2023 6:55 PM    [facility]

## 2023-04-30 NOTE — Patient Instructions (Signed)
 Medication Instructions:  Your physician recommends that you continue on your current medications as directed. Please refer to the Current Medication list given to you today.   *If you need a refill on your cardiac medications before your next appointment, please call your pharmacy*   Lab Work: NONE If you have labs (blood work) drawn today and your tests are completely normal, you will receive your results only by: MyChart Message (if you have MyChart) OR A paper copy in the mail If you have any lab test that is abnormal or we need to change your treatment, we will call you to review the results.   Testing/Procedures: You have been asked to wear a [employer]io Heart Monitor today. It is to be worn for 14 days. Please remove the monitor on 4/3 and mail back in the box provided.  If you have any questions about the monitor please call the company at [phone]    Your physician has requested that you have an echocardiogram. Echocardiography is a painless test that uses sound waves to create images of your heart. It provides your doctor with information about the size and shape of your heart and how well your heart's chambers and valves are working. This procedure takes approximately one hour. There are no restrictions for this procedure. Please do NOT wear cologne, perfume, aftershave, or lotions (deodorant is allowed). Please arrive 15 minutes prior to your appointment time.  Please note: We ask at that you not bring children with you during ultrasound (echo/ vascular) testing. Due to room size and safety concerns, children are not allowed in the ultrasound rooms during exams. Our front office staff cannot provide observation of children in our lobby area while testing is being conducted. An adult accompanying a patient to their appointment will only be allowed in the ultrasound room at the discretion of the ultrasound technician under special circumstances. We apologize for any inconvenience.     Follow-Up: At [facility], you and your health needs are our priority.  As part of our continuing mission to provide you with exceptional heart care, we have created designated Provider Care Teams.  These Care Teams include your primary Cardiologist (physician) and Advanced Practice Providers (APPs -  Physician Assistants and Nurse Practitioners) who all work together to provide you with the care you need, when you need it.  We recommend signing up for the patient portal called "MyChart".  Sign up information is provided on this After Visit Summary.  MyChart is used to connect with patients for Virtual Visits (Telemedicine).  Patients are able to view lab/test results, encounter notes, upcoming appointments, etc.  Non-urgent messages can be sent to your provider as well.   To learn more about what you can do with MyChart, go to [email or web address].    Your next appointment:   6 month(s)  Provider:   Gypsy Balsam, MD    Other Instructions

## 2023-05-11 ENCOUNTER — Encounter: Payer: Self-pay | Admitting: Neurology

## 2023-05-21 ENCOUNTER — Ambulatory Visit: Attending: Cardiology

## 2023-05-21 ENCOUNTER — Other Ambulatory Visit: Payer: Self-pay | Admitting: Cardiology

## 2023-05-21 DIAGNOSIS — E785 Hyperlipidemia, unspecified: Secondary | ICD-10-CM

## 2023-05-21 DIAGNOSIS — R0600 Dyspnea, unspecified: Secondary | ICD-10-CM

## 2023-05-22 DIAGNOSIS — R002 Palpitations: Secondary | ICD-10-CM | POA: Diagnosis not present

## 2023-05-22 LAB — ECHOCARDIOGRAM COMPLETE
AR max vel: 1.04 cm2
AV Area VTI: 1.2 cm2
AV Area mean vel: 1.12 cm2
AV Mean grad: 17 mmHg
AV Peak grad: 30.3 mmHg
Ao pk vel: 2.75 m/s
P 1/2 time: 880 ms
S' Lateral: 3.6 cm

## 2023-05-27 DIAGNOSIS — H353131 Nonexudative age-related macular degeneration, bilateral, early dry stage: Secondary | ICD-10-CM | POA: Diagnosis not present

## 2023-05-27 DIAGNOSIS — H40013 Open angle with borderline findings, low risk, bilateral: Secondary | ICD-10-CM | POA: Diagnosis not present

## 2023-05-28 NOTE — Telephone Encounter (Signed)
 Rx refill sent to pharmacy.

## 2023-06-08 DIAGNOSIS — R002 Palpitations: Secondary | ICD-10-CM | POA: Diagnosis not present

## 2023-06-09 DIAGNOSIS — Z1211 Encounter for screening for malignant neoplasm of colon: Secondary | ICD-10-CM | POA: Diagnosis not present

## 2023-06-09 DIAGNOSIS — C50919 Malignant neoplasm of unspecified site of unspecified female breast: Secondary | ICD-10-CM | POA: Insufficient documentation

## 2023-06-09 DIAGNOSIS — Z860100 Personal history of colon polyps, unspecified: Secondary | ICD-10-CM | POA: Diagnosis not present

## 2023-06-09 DIAGNOSIS — Z17421 Hormone receptor negative with human epidermal growth factor receptor 2 negative status: Secondary | ICD-10-CM

## 2023-06-09 HISTORY — DX: Hormone receptor negative with human epidermal growth factor receptor 2 negative status: Z17.421

## 2023-06-09 MED ORDER — METOPROLOL TARTRATE 25 MG PO TABS: 25.0000 mg | ORAL_TABLET | Freq: Two times a day (BID) | ORAL | 3 refills | Status: DC

## 2023-06-10 DIAGNOSIS — M8589 Other specified disorders of bone density and structure, multiple sites: Secondary | ICD-10-CM | POA: Diagnosis not present

## 2023-06-10 DIAGNOSIS — Z13820 Encounter for screening for osteoporosis: Secondary | ICD-10-CM | POA: Diagnosis not present

## 2023-06-10 DIAGNOSIS — M85852 Other specified disorders of bone density and structure, left thigh: Secondary | ICD-10-CM | POA: Diagnosis not present

## 2023-06-10 DIAGNOSIS — M85851 Other specified disorders of bone density and structure, right thigh: Secondary | ICD-10-CM | POA: Diagnosis not present

## 2023-06-10 DIAGNOSIS — Z780 Asymptomatic menopausal state: Secondary | ICD-10-CM | POA: Diagnosis not present

## 2023-06-22 ENCOUNTER — Telehealth: Payer: Self-pay | Admitting: Cardiology

## 2023-06-22 DIAGNOSIS — Z1330 Encounter for screening examination for mental health and behavioral disorders, unspecified: Secondary | ICD-10-CM | POA: Diagnosis not present

## 2023-06-22 DIAGNOSIS — G894 Chronic pain syndrome: Secondary | ICD-10-CM | POA: Diagnosis not present

## 2023-06-22 NOTE — Telephone Encounter (Signed)
 Pt c/o medication issue:  1. Name of Medication: metoprolol  tartrate (LOPRESSOR ) 25 MG tablet   2. How are you currently taking this medication (dosage and times per day)? As written  3. Are you having a reaction (difficulty breathing--STAT)? No   4. What is your medication issue? Pt was switched to this medication on 4/30. Pt said she feels terrible and feels it is the medication

## 2023-06-22 NOTE — Telephone Encounter (Signed)
 Called the patient and she reported that since her metoprolol  was changed to Metoprolol  tartrate from Metoprolol  succinate she has no energy and is very weak. Patient also reported that it feels like her heart is shaking in her chest. When asked about blood pressure and pulse results she stated that her blood pressure machine was broken. Please advise.

## 2023-06-23 ENCOUNTER — Other Ambulatory Visit (HOSPITAL_COMMUNITY): Payer: Self-pay

## 2023-06-23 ENCOUNTER — Telehealth: Payer: Self-pay | Admitting: Cardiology

## 2023-06-23 MED ORDER — METOPROLOL SUCCINATE ER 50 MG PO TB24: 50.0000 mg | ORAL_TABLET | Freq: Every day | ORAL | 3 refills | Status: DC

## 2023-06-23 MED ORDER — METOPROLOL SUCCINATE ER 50 MG PO TB24
50.0000 mg | ORAL_TABLET | Freq: Every day | ORAL | 3 refills | Status: DC
  Filled 2023-06-23: qty 90, 90d supply, fill #0

## 2023-06-23 NOTE — Telephone Encounter (Signed)
Patient calling back for update. Please advise  

## 2023-06-23 NOTE — Telephone Encounter (Signed)
*  STAT* If patient is at the pharmacy, call can be transferred to refill team.   1. Which medications need to be refilled? (please list name of each medication and dose if known) Metoprolol  Succinate 50 mg- was sent to the wrong pharmacy   2. Would you like to learn more about the convenience, safety, & potential cost savings by using the [facility]?    3. Are you open to using the [facility] (Type [facility]. .   4. Which pharmacy/location (including street and city if local pharmacy) is medication to be sent to?[facility]  [place]   5. Do they need a 30 day or 90 day supply? 90 days and refills- please call today

## 2023-06-23 NOTE — Telephone Encounter (Signed)
 Called and spoke to patient.   Reviewed that Pattricia Bores recommends stopping Metoprolol  Tartrate and start on Metoprolol  Succinate 50 mg once a day.  Patient verbalized understanding and had no further questions.

## 2023-06-23 NOTE — Telephone Encounter (Signed)
 Pt's medication was resent to pt's preferred pharmacy. Confirmation received.

## 2023-06-24 ENCOUNTER — Other Ambulatory Visit (HOSPITAL_COMMUNITY): Payer: Self-pay

## 2023-06-29 DIAGNOSIS — E785 Hyperlipidemia, unspecified: Secondary | ICD-10-CM | POA: Diagnosis not present

## 2023-06-29 DIAGNOSIS — J4540 Moderate persistent asthma, uncomplicated: Secondary | ICD-10-CM | POA: Diagnosis not present

## 2023-06-29 DIAGNOSIS — Z1331 Encounter for screening for depression: Secondary | ICD-10-CM | POA: Diagnosis not present

## 2023-06-29 DIAGNOSIS — E559 Vitamin D deficiency, unspecified: Secondary | ICD-10-CM | POA: Diagnosis not present

## 2023-06-29 DIAGNOSIS — M19071 Primary osteoarthritis, right ankle and foot: Secondary | ICD-10-CM | POA: Diagnosis not present

## 2023-06-29 DIAGNOSIS — N3281 Overactive bladder: Secondary | ICD-10-CM | POA: Diagnosis not present

## 2023-06-29 DIAGNOSIS — Z86718 Personal history of other venous thrombosis and embolism: Secondary | ICD-10-CM | POA: Diagnosis not present

## 2023-06-29 DIAGNOSIS — M160 Bilateral primary osteoarthritis of hip: Secondary | ICD-10-CM | POA: Diagnosis not present

## 2023-06-29 DIAGNOSIS — R002 Palpitations: Secondary | ICD-10-CM | POA: Diagnosis not present

## 2023-06-29 DIAGNOSIS — E0590 Thyrotoxicosis, unspecified without thyrotoxic crisis or storm: Secondary | ICD-10-CM | POA: Diagnosis not present

## 2023-06-29 DIAGNOSIS — M19072 Primary osteoarthritis, left ankle and foot: Secondary | ICD-10-CM | POA: Diagnosis not present

## 2023-06-29 DIAGNOSIS — R600 Localized edema: Secondary | ICD-10-CM | POA: Diagnosis not present

## 2023-07-02 DIAGNOSIS — K5730 Diverticulosis of large intestine without perforation or abscess without bleeding: Secondary | ICD-10-CM | POA: Diagnosis not present

## 2023-07-02 DIAGNOSIS — Z7982 Long term (current) use of aspirin: Secondary | ICD-10-CM | POA: Diagnosis not present

## 2023-07-02 DIAGNOSIS — Z860101 Personal history of adenomatous and serrated colon polyps: Secondary | ICD-10-CM | POA: Diagnosis not present

## 2023-07-02 DIAGNOSIS — Z1211 Encounter for screening for malignant neoplasm of colon: Secondary | ICD-10-CM | POA: Diagnosis not present

## 2023-07-02 DIAGNOSIS — Z853 Personal history of malignant neoplasm of breast: Secondary | ICD-10-CM | POA: Diagnosis not present

## 2023-07-02 DIAGNOSIS — Z860100 Personal history of colon polyps, unspecified: Secondary | ICD-10-CM | POA: Diagnosis not present

## 2023-07-02 DIAGNOSIS — J449 Chronic obstructive pulmonary disease, unspecified: Secondary | ICD-10-CM | POA: Diagnosis not present

## 2023-07-02 DIAGNOSIS — Z79899 Other long term (current) drug therapy: Secondary | ICD-10-CM | POA: Diagnosis not present

## 2023-07-02 LAB — HM COLONOSCOPY

## 2023-07-15 NOTE — Progress Notes (Incomplete)
 [facility]  [address] [place],  [place]  [address] [phone]  Clinic Day:  07/15/23   Referring physician: Adrian Hopper, MD  ASSESSMENT & PLAN:  Assessment: Malignant neoplasm of central portion of left female breast History of stage I triple negative breast cancer, diagnosed January 2012. She was treated with bilateral mastectomies alone. Genetic testing in 2015 was negative. She remains without obvious evidence of recurrence. She has new right rib pain, likely nonmalignant musculoskeletal pain, but I feel she needs a nuclear bone scan to evaluate for bone metastasis. She has multiple other complaints, but has had a recent CT abdomen, so I will request that report.   Rib pain on right side The patient reports new onset severe right rib pain and tenderness. Rib xray was negative. She takes hydrocodone/apap for back pain which has not helped this pain. I advised her it would be fine to try the muscle relaxer she has at home. Due to her history of triple negative breast cancer, I recommend nuclear bone scan to evaluate for bone metastasis.    Plan:  The patient understands the plans discussed today and is in agreement with them.  She knows to contact our office if she develops concerns prior to her next appointment.  I provided *** minutes of face-to-face time during this this encounter and > 50% was spent counseling as documented under my assessment and plan.   Nolia Baumgartner, MD  [facility] - A DEPT OF [facility] [address] [place] [address] Dept: [phone] Dept Fax: [phone]   No orders of the defined types were placed in this encounter.   CHIEF COMPLAINT:  CC: Remote history of stage IA triple negative breast cancer  Current Treatment:  Surveillance  HISTORY OF PRESENT ILLNESS:  Karen Shannon is a 73 y.o. female with a history of stage IA (T1b N0 M0) triple negative  left breast cancer diagnosed in January 2012.  She underwent bilateral mastectomies.  Pathology revealed a 9 mm, grade 1, invasive ductal carcinoma with negative nodes.  Estrogen and progesterone receptors were negative and her 2 Neu negative.  Ki 67 was 9%.  She was placed on observation alone.  She was referred to genetic counselor at [facility] due to her triple negative disease.  She underwent testing with the [facility] Breast Next Panel.  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  She has multiple comorbidities, including severe COPD.  Screening colonoscopy in November 2018 with Dr. Randal Bury, at which time had removal of a small polyp.  Follow-up colonoscopy in 5 years was recommended.   She described transient ischemic attacks in September and October 2020 .  She is followed by by Dr. Janne Members, a neurologist, at [facility]  She stated she was scheduled for an MRI/MRA head at [facility], as well as a cognitive test in January 2021.  We were concerned about temporal arteritis, but a sed rate was normal.    She states she had decreased memory on her cognitive test and he plans to repeat that yearly.  She is on memantine and donepezil and denies further episodes of TIA.  She was found to have a deep venous thrombus of the left leg in at [facility] in February 2021, for which she was placed on apixaban 5 mg twice daily and was given samples. She could not afford  to continue apixaban, so was placed on aspirin 81 mg daily once the thrombus resolved. She has a history of mitral valve prolapse and murmur, for which she sees Dr. Gordan Latina.  She states she wore a heart monitor for in January 2021, which revealed complex arrhythmia. She had a stress test stress test in his office, which was normal.  She also had ultrasounds of the carotid arteries, which were normal.  She is on metoprolol  and furosemide .  Bone density scan in July 2021 revealed  osteopenia.  She has these done at Dr. Vianne Grad office. She had eyelid surgery in October 2021. She undergoes routine lab work at Dr. Vianne Grad office.    CTA heart in May revealed non obstructive coronary artery disease.   Oncology History  Malignant neoplasm of central portion of left female breast   Initial Diagnosis   Malignant neoplasm of central portion of left female breast (HCC)   02/21/2010 Cancer Staging   Staging form: Breast, AJCC 7th Edition - Clinical stage from 02/21/2010: Stage IA (T1b, N0, M0) - Signed by Nolia Baumgartner, MD on 12/21/2020 Staged by: Managing physician Diagnostic confirmation: Positive histology Specimen type: Excision Histopathologic type: Infiltrating duct carcinoma, NOS Stage prefix: Initial diagnosis Laterality: Left Tumor size (mm): 9 Method of lymph node assessment: Sentinel lymph node biopsy Histologic grade (G): G1 Lymph-vascular invasion (LVI): LVI not present (absent)/not identified Residual tumor (R): R0 - None Paget's disease: Negative Tumor grade (Scarff-Bloom-Richardson system): G1 Estrogen receptor status: Negative Progesterone receptor status: Negative HER2 status: Negative Stage used in treatment planning: Yes National guidelines used in treatment planning: Yes Type of national guideline used in treatment planning: NCCN   11/18/2013 Genetic Testing   Negative genetic testing on the BreastNext panel.  PALB2 p.L100F VUS.  The BreastNext gene panel offered by [facility] includes sequencing and rearrangement analysis for the following 17 genes:  ATM, BARD1, BRCA1, BRCA2, BRIP1, CDH1, CHEK2, MRE11A, MUTYH, NBN, NF1, PALB2, PTEN, RAD50, RAD51C, RAD51D, and TP53.   The report date was 11/18/2013.  UPDATE: PALB2 p.L100F has been reclassified to Likely Benign.  The amended report date is November 09, 2020.       INTERVAL HISTORY:  Karen Shannon is here today for repeat clinical assessment for her emote history of stage IA triple negative  breast cancer. Patient states that she feels *** and ***.       She denies fever, chills, night sweats, or other signs of infection. She denies cardiorespiratory and gastrointestinal issues. She  denies pain. Her appetite is *** and Her weight {Weight change:10426}.    She reports severe pain in the right ribs for several days, which she rates 6 out of 10 today.  She denies any injury to cause the pain.  She is on hydrocodone/APAP for pinched nerves in the lumbar spine, which has not helped her rib pain.  She has a history of bursitis of the bilateral hips.  She has resumed physical therapy for her back and hip pain.  She otherwise denies any changes in her mastectomy sites. She reports shortness of breath with exertion, she denies chest pain or palpitations. She reports abdominal bloating. She denies fevers or chills. Her appetite is good. Her weight has decreased 3 pounds over last 3 months.    She is not sure if she has had on her bone density scan.  She continues calcium  and vitamin D .  She reports multiple other issues.  She saw her pulmonologist yesterday and despite a CPAP has had episodes of  apnea.  She is having another sleep study done.  She states something "drastic" may need to be done.  She had bilateral eyelid surgery again in August.  She had COVID in August.  She reports blood in her urine for 6 months.  She states she was treated for UTI, including IV antibiotics.  She was referred to Dr. Inga Manges at [facility] in [place].  She states she had a CT abdomen revealed gallbladder stone.  I will request this report.  She is scheduled for follow-up with Dr. Inga Manges and cystoscopy on December 16.  He has her taking cephalexin 250 mg at bedtime for prevention at this time.  She states the last urine on December 9 at Dr. Vianne Grad office was negative.  She states she sees Dr. Leveda Rea for thyroid  nodules.  REVIEW OF SYSTEMS:  Review of Systems  Constitutional:  Negative for appetite change, chills,  diaphoresis, fatigue, fever and unexpected weight change.  HENT:   Negative for lump/mass, mouth sores, nosebleeds, sore throat and trouble swallowing.   Eyes:  Positive for eye problems (mild macular degeneration, right eye).  Respiratory:  Positive for shortness of breath. Negative for chest tightness, cough and hemoptysis.   Cardiovascular:  Negative for chest pain, leg swelling and palpitations.  Gastrointestinal:  Negative for abdominal pain, blood in stool, constipation, diarrhea, nausea and vomiting.  Endocrine: Negative for hot flashes.  Genitourinary:  Positive for hematuria (chronic). Negative for difficulty urinating, dysuria, frequency, vaginal bleeding and vaginal discharge.   Musculoskeletal:  Negative for arthralgias, back pain, gait problem and myalgias.  Skin:  Negative for rash.  Neurological:  Positive for extremity weakness (right leg). Negative for dizziness, gait problem, headaches, light-headedness and numbness.  Hematological:  Negative for adenopathy. Does not bruise/bleed easily.  Psychiatric/Behavioral:  Negative for depression and sleep disturbance. The patient is not nervous/anxious.      VITALS:  There were no vitals taken for this visit.  Wt Readings from Last 3 Encounters:  04/30/23 191 lb 9.6 oz (86.9 kg)  04/16/23 192 lb 12.8 oz (87.5 kg)  01/21/23 189 lb (85.7 kg)    There is no height or weight on file to calculate BMI.  Performance status (ECOG): 2 - Symptomatic, <50% confined to bed  PHYSICAL EXAM:  Physical Exam Vitals and nursing note reviewed.  Constitutional:      General: She is not in acute distress.    Appearance: Normal appearance.  HENT:     Head: Normocephalic and atraumatic.     Mouth/Throat:     Mouth: Mucous membranes are moist.     Pharynx: Oropharynx is clear. No oropharyngeal exudate or posterior oropharyngeal erythema.  Eyes:     General: No scleral icterus.    Extraocular Movements: Extraocular movements intact.      Conjunctiva/sclera: Conjunctivae normal.     Pupils: Pupils are equal, round, and reactive to light.  Cardiovascular:     Rate and Rhythm: Normal rate and regular rhythm.     Heart sounds: Normal heart sounds. No murmur heard.    No friction rub. No gallop.  Pulmonary:     Effort: Pulmonary effort is normal.     Breath sounds: Normal breath sounds. No wheezing, rhonchi or rales.  Chest:  Breasts:    Right: Absent.     Left: Absent.     Comments: Tenderness right anterior lateral ribs. No masses or other concerning lesions bilateral mastectomy sites Abdominal:     General: There is no distension.  Palpations: Abdomen is soft. There is no hepatomegaly, splenomegaly or mass.     Tenderness: There is no abdominal tenderness.  Musculoskeletal:        General: Normal range of motion.     Cervical back: Normal range of motion and neck supple. No tenderness.     Right lower leg: No edema.     Left lower leg: No edema.  Lymphadenopathy:     Cervical: No cervical adenopathy.     Upper Body:     Right upper body: No supraclavicular or axillary adenopathy.     Left upper body: No supraclavicular or axillary adenopathy.     Lower Body: No right inguinal adenopathy. No left inguinal adenopathy.  Skin:    General: Skin is warm and dry.     Coloration: Skin is not jaundiced.     Findings: No rash.  Neurological:     Mental Status: She is alert and oriented to person, place, and time.     Cranial Nerves: No cranial nerve deficit.  Psychiatric:        Mood and Affect: Mood normal.        Behavior: Behavior normal.        Thought Content: Thought content normal.     LABS:       No data to display            Latest Ref Rng & Units 07/30/2022    9:41 AM 05/28/2022   12:00 PM 10/21/2021    5:10 PM  CMP  Glucose 70 - 99 mg/dL  90  92   BUN 8 - 27 mg/dL  17  17   Creatinine 6.96 - 1.00 mg/dL  2.95  2.84   Sodium 132 - 144 mmol/L  142  145   Potassium 3.5 - 5.2 mmol/L  4.4  4.2    Chloride 96 - 106 mmol/L  104  107   CO2 20 - 29 mmol/L  25  26   Calcium  8.7 - 10.3 mg/dL  9.1  9.4   Total Protein 6.0 - 8.5 g/dL 5.8     Total Bilirubin 0.0 - 1.2 mg/dL 0.6     Alkaline Phos 44 - 121 IU/L 91     AST 0 - 40 IU/L 19     ALT 0 - 32 IU/L 23       STUDIES:  EXAM: 02/20/2023 NUCLEAR MEDICINE RENAL SCAN WITH DIURETIC ADMINISTRATION IMPRESSION: 1. Prompt symmetric arterial perfusion of the kidneys bilaterally 2. Asymmetric cortical thinning involving the left kidney with resultant decreased relative left renal function corresponding to 38% of overall renal activity. 3. Central filling defect involving the left kidney which does not appear to communicate with the collecting system. When correlating with prior CT examination, this represents multiple central parapelvic cysts. There 4. Prompt, symmetric excretion of radiotracer. No evidence of obstruction.  HISTORY:   Past Medical History:  Diagnosis Date   Acute deep vein thrombosis (DVT) of left peroneal vein 04/11/2019   Aortic stenosis 04/12/2021   Basal cell carcinoma 07/25/2021   Basal cell carcinoma (BCC) in situ of skin 02/14/2021   Had several places of basal cell carcinoma removed on top of head over the last 2 to 3 years.     Cervical spondylosis 03/18/2018   Chronic bilateral low back pain without sciatica 03/18/2018   Chronic foot pain, left 03/14/2019   Possible fracture base 2nd metatarsal   Chronic neck pain 03/18/2018   COPD (chronic obstructive pulmonary  disease)    DDD (degenerative disc disease), cervical 03/18/2018   Displaced fracture of fifth metatarsal bone, right foot 03/2020   DVT femoral (deep venous thrombosis) with thrombophlebitis, left (HCC) 04/11/2019   Dyspnea on exertion 01/27/2018   Facet arthritis, degenerative, lumbar spine 03/18/2018   Family history of malignant neoplasm of breast    Generalized anxiety disorder 01/24/2019   Possible history of panic attacks   Genetic testing  11/14/2020   Negative genetic testing on the BreastNext panel.  PALB2 p.L100F VUS.  The BreastNext gene panel offered by [facility] includes sequencing and rearrangement analysis for the following 17 genes:  ATM, BARD1, BRCA1, BRCA2, BRIP1, CDH1, CHEK2, MRE11A, MUTYH, NBN, NF1, PALB2, PTEN, RAD50, RAD51C, RAD51D, and TP53.   The report date was 11/18/2013.     UPDATE: PALB2 p.L100F has been reclassified to Likely B   Greater trochanteric bursitis of both hips 04/10/2018   Heart murmur 01/27/2018   History of breast cancer 03/18/2018   Formatting of this note might be different from the original. S/p bilateral mastectomy     History of deep vein thrombosis (DVT) of lower extremity 10/02/2019   Hypertrophic and atrophic condition of skin 07/03/2021   Left carotid artery stenosis 07/03/2022   Left leg swelling 02/23/2019   Lumbar spondylosis 03/18/2018   Malignant neoplasm of central portion of left female breast    S/p bilateral mastectomy   Malignant neoplasm of female breast (HCC) 02/10/2009   Genetic testing neg; triple neg; S/P bilat mastectomy     Mild cognitive impairment of uncertain or unknown etiology 03/20/2022   Mitral valve prolapse 01/27/2018   Neuropathy 06/17/2021   Obstructive sleep apnea    Variable CPAP use.   Osteopenia after menopause 12/21/2020   Other idiopathic scoliosis, lumbar region 03/18/2018   Pain of left calf 03/14/2019   Palpitations 01/25/2018   Peripheral vascular disease, unspecified 10/29/2020   up to 69% stenosis on the lef internal carotic artery based on cardiac ultrasounds from June 2022   Personal history of colon polyps, unspecified 03/16/2023   Pes planus of left foot 02/23/2019   Pinched nerve 02/10/2021   Ptosis of both upper eyelids 08/27/2022   Had medical necessary surgery for droopy eyelids as a result of loss of peripheral vision in both eyes.     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 Leveda Rea had suspicions that I may tongue cancer. I had biopsy of tongue. Was only a cyst. No cancer.     Urge incontinence 07/28/2019   Varicose veins of left lower extremity    Ventricular tachycardia 02/28/2019    Past Surgical History:  Procedure Laterality Date   basal cell removed     Head   BUNIONECTOMY Right 1999   eye lid lift Bilateral    done July 2024   MASTECTOMY Bilateral 2012   NASAL SEPTUM SURGERY  1996   SKIN CANCER EXCISION  2018   Top of head    SKIN CANCER EXCISION  07/2019   Nose   TENDON TRANSFER Right 1999   foot   TONGUE BIOPSY  12/04/2021   Dr Leveda Rea    Family History  Problem Relation Age of Onset   Lymphoma Mother 69  deceased 83   Breast cancer Maternal Aunt        Dx 82s; Deceased 61s   Lung cancer Maternal Uncle    Stomach cancer Paternal Uncle        deceased 20   Liver cancer Maternal Uncle    Prostate cancer Other        3 mat cousins   Breast cancer Other 75       mother's mat half-sister   Emphysema Father     Social History:  reports that she has never smoked. She has never used smokeless tobacco. She reports that she does not currently use alcohol. She reports that she does not use drugs.The patient is alone today.  Allergies:  Allergies  Allergen Reactions   Clarithromycin Diarrhea and Other (See Comments)   Prednisone Other (See Comments)    Passed out Other reaction(s): other   Sertraline Diarrhea   Sertraline Hcl Diarrhea   Duloxetine Hives, Itching, Nausea Only, Rash and Swelling    Other Reaction(s): Other (See Comments)   Duloxetine Hcl Itching, Nausea Only, Other (See Comments), Rash and Swelling    Current Medications: Current Outpatient Medications  Medication Sig Dispense Refill   ascorbic  acid (VITAMIN C) 500 MG tablet Take 500 mg by mouth daily.     aspirin 81 MG chewable tablet Chew 81 mg by mouth daily.     atorvastatin  (LIPITOR) 80 MG tablet Take 1 tablet (80 mg total) by mouth daily. 90 tablet 2   calcium  carbonate (SUPER CALCIUM ) 1500 (600 Ca) MG TABS tablet Take 1 tablet by mouth daily.     cephALEXin (KEFLEX) 250 MG capsule Take 250 mg by mouth at bedtime.     Cholecalciferol 25 MCG (1000 UT) capsule Take 1,000 Units by mouth daily.     dicyclomine (BENTYL) 10 MG capsule Take 10 mg by mouth as needed for spasms (IBS).     diphenoxylate-atropine (LOMOTIL) 2.5-0.025 MG tablet Take 1 tablet by mouth as needed for diarrhea or loose stools.     donepezil (ARICEPT) 10 MG tablet Take 10 mg by mouth at bedtime.     dorzolamide (TRUSOPT) 2 % ophthalmic solution Place 1 drop into the right eye 2 (two) times daily.     ergocalciferol  (VITAMIN D2) 50000 units capsule Take 50,000 Units by mouth once a week.     ezetimibe  (ZETIA ) 10 MG tablet Take 1 tablet by mouth once daily 90 tablet 3   furosemide  (LASIX ) 20 MG tablet Take 20 mg by mouth daily.     HYDROcodone-acetaminophen (NORCO) 7.5-325 MG tablet Take 1 tablet by mouth 3 (three) times daily.     loratadine (CLARITIN) 10 MG tablet Take 10 mg by mouth daily as needed for allergies.     memantine (NAMENDA) 5 MG tablet Take 5 mg by mouth 2 (two) times daily.     metoprolol  succinate (TOPROL -XL) 50 MG 24 hr tablet Take 1 tablet (50 mg total) by mouth daily. Take with or immediately following a meal. 90 tablet 3   montelukast (SINGULAIR) 10 MG tablet Take 10 mg by mouth at bedtime.     Multiple Vitamins-Minerals (PRESERVISION AREDS 2+MULTI VIT PO) Take 1 tablet by mouth daily.     neomycin-polymyxin b-dexamethasone  (MAXITROL) 3.5-10000-0.1 OINT Place 1 Application into both eyes 3 (three) times daily.     ondansetron  (ZOFRAN ) 4 MG tablet Take 4 mg by mouth every 8 (eight) hours as needed for nausea or vomiting.     senna (SENOKOT) 8.6  MG tablet Take 1 tablet by mouth daily.     solifenacin (VESICARE) 10 MG tablet Take 10 mg by mouth daily.     tamsulosin (FLOMAX) 0.4 MG CAPS capsule Take 0.4 mg by mouth daily after supper.     tiZANidine (ZANAFLEX) 4 MG tablet Take 4 mg by mouth every 8 (eight) hours as needed for muscle spasms.     Triamcinolone  Acetonide (KENALOG -80 IJ) Inject 1 Dose as directed once.     vitamin B-12 (CYANOCOBALAMIN) 1000 MCG tablet Take 1,000 mcg by mouth daily.     Zinc Sulfate 66 MG TABS Take 66 mg by mouth daily.     No current facility-administered medications for this visit.    I,Jasmine M Lassiter,acting as a scribe for Nolia Baumgartner, MD.,have documented all relevant documentation on the behalf of Nolia Baumgartner, MD,as directed by  Nolia Baumgartner, MD while in the presence of Nolia Baumgartner, MD.

## 2023-07-22 ENCOUNTER — Inpatient Hospital Stay: Payer: Medicare Other | Admitting: Oncology

## 2023-07-29 ENCOUNTER — Other Ambulatory Visit: Payer: Self-pay | Admitting: Cardiology

## 2023-07-31 ENCOUNTER — Telehealth: Payer: Self-pay | Admitting: Oncology

## 2023-07-31 ENCOUNTER — Inpatient Hospital Stay: Attending: Oncology | Admitting: Oncology

## 2023-07-31 ENCOUNTER — Other Ambulatory Visit: Payer: Self-pay | Admitting: Oncology

## 2023-07-31 VITALS — BP 142/78 | HR 63 | Temp 98.1°F | Resp 18 | Ht 64.0 in | Wt 196.1 lb

## 2023-07-31 DIAGNOSIS — K149 Disease of tongue, unspecified: Secondary | ICD-10-CM | POA: Diagnosis not present

## 2023-07-31 DIAGNOSIS — Z79899 Other long term (current) drug therapy: Secondary | ICD-10-CM | POA: Insufficient documentation

## 2023-07-31 DIAGNOSIS — Z85828 Personal history of other malignant neoplasm of skin: Secondary | ICD-10-CM | POA: Diagnosis not present

## 2023-07-31 DIAGNOSIS — Z9013 Acquired absence of bilateral breasts and nipples: Secondary | ICD-10-CM | POA: Diagnosis not present

## 2023-07-31 DIAGNOSIS — Z86718 Personal history of other venous thrombosis and embolism: Secondary | ICD-10-CM | POA: Insufficient documentation

## 2023-07-31 DIAGNOSIS — Z8673 Personal history of transient ischemic attack (TIA), and cerebral infarction without residual deficits: Secondary | ICD-10-CM | POA: Diagnosis not present

## 2023-07-31 DIAGNOSIS — Z171 Estrogen receptor negative status [ER-]: Secondary | ICD-10-CM

## 2023-07-31 DIAGNOSIS — Z853 Personal history of malignant neoplasm of breast: Secondary | ICD-10-CM | POA: Diagnosis not present

## 2023-07-31 DIAGNOSIS — M5030 Other cervical disc degeneration, unspecified cervical region: Secondary | ICD-10-CM | POA: Insufficient documentation

## 2023-07-31 DIAGNOSIS — Z7982 Long term (current) use of aspirin: Secondary | ICD-10-CM | POA: Insufficient documentation

## 2023-07-31 DIAGNOSIS — C50112 Malignant neoplasm of central portion of left female breast: Secondary | ICD-10-CM

## 2023-07-31 NOTE — Telephone Encounter (Signed)
 Patient has been scheduled for follow-up visit per 07/31/23 LOS.  Pt aware of scheduled appt details.

## 2023-07-31 NOTE — Progress Notes (Signed)
 [facility]  [address] [place],  [place]  [address] [phone]  Clinic Day:  07/31/23  Referring physician: Keren Vicenta BRAVO, MD  ASSESSMENT & PLAN:   Assessment & Plan: Malignant neoplasm of central portion of left female breast [facility]) History of stage I triple negative breast cancer, diagnosed January 2012, status post bilateral mastectomy. She remains without evidence of recurrence. Genetic testing in 2015 was negative.   TIA (transient ischemic attack) History of transient ischemic attacks, and in June 2022 had a CVA.  This impaired her memory, but she has been placed on medication with improvement.    DVT femoral (deep venous thrombosis) with thrombophlebitis, left (HCC) Deep venous thrombosis of the left lower extremity in May 2021. Recurrent provoked deep venous thrombosis of the right lower extremity in February 2022, for which she continues aspirin  81 mg daily daily   Basal cell carcinoma of scalp The patient felt a nodule and Dr. Kendell had resected a skin cancer years ago and so checks her regularly.  May 2023 a biopsy was positive for basal cell carcinoma and so a wide excision was done but they were unable to close completely. Final pathology still showed residual basal cell carcinoma but with clear margins. I am  not concerned about the area of her central scar but she will f/u with Dr. Kendell.   Tongue lesion Pathology was benign.    Degenerative Disc Disease  She has 2 pinched nerves of the lower spine. She has not tolerated any of the medications used for neuropathic pain. Bone scan in December did not show any malignant disease, but some areas of degenerative change at bilateral ankles and lumbar spine.   Plan:  Last time we obtained a nuclear bone scan in December of 2024 to evaluate her back pain and this did reveal focal increased activity at the right side of L4-L5 as well as moderate increased activity of both ankles, consistent with  degenerative changes. She has known degenerative disc disease and occasionally has physical therapy. She was also noted to have asymmetric hydronephrosis. A subsequent renal scan revealed asymmetric cortical thinning of the left kidney with multiple central parapelvic cysts. She has been prone to UTI's.  Dr. Bert performed a colonoscopy on 07/02/23 which was negative for polyps for the first time so he recommended follow up in 10 years.  She had a basal cell carcinoma of her scalp resected in May of 2023 and sees Dr. Kendell every 6 months. She tells me she had another scalp lesion. Dr. Keren did labs 1 month ago and her metoprolol  was increased from 25 to 50 mg.  She complains of fatigue but denies pain. I will see her back in 6 months. She understands and agrees with this plan of care.  She knows to contact our office if she develops concerns prior to her next appointment.   Karen VEAR Cornish, MD  [facility] - A DEPT OF [facility] [address] [place] [address] Dept: [phone] Dept Fax: [phone]   No orders of the defined types were placed in this encounter.     CHIEF COMPLAINT:  CC: Remote history of stage IA triple negative breast cancer  Current Treatment:  Surveillance  HISTORY OF PRESENT ILLNESS:  Karen Shannon is a 73 y.o. female with a history of stage IA (T1b N0 M0) triple negative left breast cancer diagnosed in January 2012.  She underwent  bilateral mastectomies.  Pathology revealed a 9 mm, grade 1, invasive ductal carcinoma with negative nodes.  Estrogen and progesterone receptors were negative and her 2 Neu negative.  Ki 67 was 9%.  She was placed on observation alone.  She was referred to genetic counselor at [facility] due to her triple negative disease.  She underwent testing with the [facility] Breast Next Panel.  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  She has multiple comorbidities, including severe COPD.  Screening colonoscopy in November 2018 with Dr. Towana, at which time had removal of a small polyp.  Follow-up colonoscopy in 5 years was recommended.   She described transient ischemic attacks in September and October 2020 .  She is followed by by Dr. Juliene Dunnings, a neurologist, at [facility].  She stated she was scheduled for an MRI/MRA head at [facility], as well as a cognitive test in January 2021.  We were concerned about temporal arteritis, but a sed rate was normal.    She states she had decreased memory on her cognitive test and he plans to repeat that yearly.  She is on memantine and donepezil and denies further episodes of TIA.  She was found to have a deep venous thrombus of the left leg in at [facility] in February 2021, for which she was placed on apixaban 5 mg twice daily and was given samples. She could not afford to continue apixaban, so was placed on aspirin 81 mg daily once the thrombus resolved. She has a history of mitral valve prolapse and murmur, for which she sees Dr. Bernie.  She states she wore a heart monitor for in January 2021, which revealed complex arrhythmia. She had a stress test stress test in his office, which was normal.  She also had ultrasounds of the carotid arteries, which were normal.  She is on metoprolol  and furosemide .  Bone density scan in July 2021 revealed osteopenia.  She has these done at Dr. Margarete office. She had eyelid surgery in October 2021. She undergoes routine lab work at Dr. Margarete office.  CTA heart in May revealed non obstructive coronary artery disease. She had a basal cell carcinoma resected from her scalp in May of 2023 and sees Dr. Kendell every 6 months.   Oncology History  Malignant neoplasm of central portion of left female breast   Initial Diagnosis   Malignant neoplasm of central portion of left female breast (HCC)   02/21/2010 Cancer Staging    Staging form: Breast, AJCC 7th Edition - Clinical stage from 02/21/2010: Stage IA (T1b, N0, M0) - Signed by Cornelius Karen DEL, MD on 12/21/2020 Staged by: Managing physician Diagnostic confirmation: Positive histology Specimen type: Excision Histopathologic type: Infiltrating duct carcinoma, NOS Stage prefix: Initial diagnosis Laterality: Left Tumor size (mm): 9 Method of lymph node assessment: Sentinel lymph node biopsy Histologic grade (G): G1 Lymph-vascular invasion (LVI): LVI not present (absent)/not identified Residual tumor (R): R0 - None Paget's disease: Negative Tumor grade (Scarff-Bloom-Richardson system): G1 Estrogen receptor status: Negative Progesterone receptor status: Negative HER2 status: Negative Stage used in treatment planning: Yes National guidelines used in treatment planning: Yes Type of national guideline used in treatment planning: NCCN   11/18/2013 Genetic Testing   Negative genetic testing on the BreastNext panel.  PALB2 p.L100F VUS.  The BreastNext gene panel offered by [employer] includes sequencing and rearrangement analysis for the following 17 genes:  ATM, BARD1, BRCA1, BRCA2, BRIP1, CDH1, CHEK2,  MRE11A, MUTYH, NBN, NF1, PALB2, PTEN, RAD50, RAD51C, RAD51D, and TP53.   The report date was 11/18/2013.  UPDATE: PALB2 p.L100F has been reclassified to Likely Benign.  The amended report date is November 09, 2020.       INTERVAL HISTORY:  Signe is here today for repeat clinical assessment for her history of left breast cancer in 2012.  Last time we obtained a nuclear bone scan in December of 2024 to evaluate her back pain and this did reveal focal increased activity at the right side of L4-L5 as well as moderate increased activity of both ankles, consistent with degenerative changes. She has known degenerative disc disease and occasionally has physical therapy. She was also noted to have asymmetric hydronephrosis. A subsequent renal scan revealed asymmetric  cortical thinning of the left kidney with multiple central parapelvic cysts. She has been prone to UTI's.  Dr. Bert performed a colonoscopy on 07/02/23 which was negative for polyps for the first time so he recommended follow up in 10 years.  She had a basal cell carcinoma resected from her scalp in May of 2023 and sees Dr. Kendell every 6 months. She tells me she had another scalp lesion. Dr. Keren did labs 1 month ago and her metoprolol  was increased from 25 to 50 mg.  She complains of fatigue but denies pain. She denies fevers or chills. She denies cardiorespiratory or gastrointestinal symptoms. Her appetite is good. Her weight has increased 5 pounds in the last 3 months. She states she sees Dr. Honor for thyroid  nodules.  REVIEW OF SYSTEMS:  Review of Systems  Constitutional:  Positive for fatigue. Negative for appetite change, chills, diaphoresis, fever and unexpected weight change.  HENT:  Negative.  Negative for lump/mass, mouth sores, nosebleeds, sore throat and trouble swallowing.   Eyes: Negative.  Eye problems: mild macular degeneration, right eye.  Respiratory: Negative.  Negative for chest tightness, cough, hemoptysis and shortness of breath.   Cardiovascular: Negative.  Negative for chest pain, leg swelling and palpitations.  Gastrointestinal: Negative.  Negative for abdominal pain, blood in stool, constipation, diarrhea, nausea and vomiting.  Endocrine: Negative.  Negative for hot flashes.  Genitourinary:  Negative for difficulty urinating, dysuria, frequency, vaginal bleeding and vaginal discharge. Hematuria: chronic.       Recurrent UTI's  Musculoskeletal:  Positive for back pain. Negative for arthralgias, gait problem and myalgias.  Skin:  Positive for wound. Negative for rash.       Another scalp lesion  Neurological: Negative.  Negative for dizziness, gait problem, headaches, light-headedness and numbness. Extremity weakness: right leg. Hematological: Negative.  Negative for  adenopathy. Does not bruise/bleed easily.  Psychiatric/Behavioral: Negative.  Negative for depression and sleep disturbance. The patient is not nervous/anxious.      VITALS:  Blood pressure (!) 142/78, pulse 63, temperature 98.1 F (36.7 C), temperature source Oral, resp. rate 18, height 5' 4 (1.626 m), weight 196 lb 1.6 oz (89 kg), SpO2 100%.  Wt Readings from Last 3 Encounters:  07/31/23 196 lb 1.6 oz (89 kg)  04/30/23 191 lb 9.6 oz (86.9 kg)  04/16/23 192 lb 12.8 oz (87.5 kg)    Body mass index is 33.66 kg/m.  Performance status (ECOG): 2 - Symptomatic, <50% confined to bed  PHYSICAL EXAM:  Physical Exam Vitals and nursing note reviewed.  Constitutional:      General: She is not in acute distress.    Appearance: Normal appearance.  HENT:     Head: Normocephalic and atraumatic.  Mouth/Throat:     Mouth: Mucous membranes are moist.     Pharynx: Oropharynx is clear. No oropharyngeal exudate or posterior oropharyngeal erythema.  Eyes:     General: No scleral icterus.    Extraocular Movements: Extraocular movements intact.     Conjunctiva/sclera: Conjunctivae normal.     Pupils: Pupils are equal, round, and reactive to light.  Cardiovascular:     Rate and Rhythm: Normal rate and regular rhythm.     Heart sounds: Murmur heard.     No friction rub. No gallop.  Pulmonary:     Effort: Pulmonary effort is normal.     Breath sounds: Normal breath sounds. No wheezing, rhonchi or rales.  Chest:  Breasts:    Right: Absent.     Left: Absent.     Comments:  No masses or other concerning lesions bilateral mastectomy sites Abdominal:     General: There is no distension.     Palpations: Abdomen is soft. There is no hepatomegaly, splenomegaly or mass.     Tenderness: There is no abdominal tenderness.  Musculoskeletal:        General: Normal range of motion.     Cervical back: Normal range of motion and neck supple. No tenderness.     Right lower leg: No edema.     Left lower  leg: No edema.  Lymphadenopathy:     Cervical: No cervical adenopathy.     Upper Body:     Right upper body: No supraclavicular or axillary adenopathy.     Left upper body: No supraclavicular or axillary adenopathy.     Lower Body: No right inguinal adenopathy. No left inguinal adenopathy.  Skin:    General: Skin is warm and dry.     Coloration: Skin is not jaundiced.     Findings: Lesion present. No rash.     Comments: She has a slightly dark area at the vertex of her scalp, in the center of her scar from prior resection  Neurological:     Mental Status: She is alert and oriented to person, place, and time.     Cranial Nerves: No cranial nerve deficit.  Psychiatric:        Mood and Affect: Mood normal.        Behavior: Behavior normal.        Thought Content: Thought content normal.     LABS:       No data to display            Latest Ref Rng & Units 07/30/2022    9:41 AM 05/28/2022   12:00 PM 10/21/2021    5:10 PM  CMP  Glucose 70 - 99 mg/dL  90  92   BUN 8 - 27 mg/dL  17  17   Creatinine 9.42 - 1.00 mg/dL  9.19  9.26   Sodium 865 - 144 mmol/L  142  145   Potassium 3.5 - 5.2 mmol/L  4.4  4.2   Chloride 96 - 106 mmol/L  104  107   CO2 20 - 29 mmol/L  25  26   Calcium  8.7 - 10.3 mg/dL  9.1  9.4   Total Protein 6.0 - 8.5 g/dL 5.8     Total Bilirubin 0.0 - 1.2 mg/dL 0.6     Alkaline Phos 44 - 121 IU/L 91     AST 0 - 40 IU/L 19     ALT 0 - 32 IU/L 23       STUDIES:  Exam(s): [id number] RAD/DG RIBS W/ CHEST 3+V-R  EXAM:  EXAM DATE: 01/19/2023.  PROCEDURE: DG RIBS W/ CHEST 3+V-R.  CLINICAL HISTORY:  73 y/o old F with PLEURODYNIA.  COMPARISON:  None.  TECHNIQUE:  4 views of the chest were obtained with Right rib detail  FINDINGS:  No linear lucency, cortical irregularity or bony deformity to suggest acute displaced rib fracture.  No pulmonary parenchymal consolidative opacities.  No pneumothorax or pleural effusion. The cardiac silhouette is not enlarged.  Dextroscoliosis of the lumbar region is noted.  IMPRESSION:  No radiographic evidence of acute displaced rib fracture.     HISTORY:   Past Medical History:  Diagnosis Date   Acute deep vein thrombosis (DVT) of left peroneal vein 04/11/2019   Aortic stenosis 04/12/2021   Basal cell carcinoma 07/25/2021   Basal cell carcinoma (BCC) in situ of skin 02/14/2021   Had several places of basal cell carcinoma removed on top of head over the last 2 to 3 years.     Cervical spondylosis 03/18/2018   Chronic bilateral low back pain without sciatica 03/18/2018   Chronic foot pain, left 03/14/2019   Possible fracture base 2nd metatarsal   Chronic neck pain 03/18/2018   COPD (chronic obstructive pulmonary disease)    DDD (degenerative disc disease), cervical 03/18/2018   Displaced fracture of fifth metatarsal bone, right foot 03/2020   DVT femoral (deep venous thrombosis) with thrombophlebitis, left (HCC) 04/11/2019   Dyspnea on exertion 01/27/2018   Facet arthritis, degenerative, lumbar spine 03/18/2018   Family history of malignant neoplasm of breast    Generalized anxiety disorder 01/24/2019   Possible history of panic attacks   Genetic testing 11/14/2020   Negative genetic testing on the BreastNext panel.  PALB2 p.L100F VUS.  The BreastNext gene panel offered by [facility] includes sequencing and rearrangement analysis for the following 17 genes:  ATM, BARD1, BRCA1, BRCA2, BRIP1, CDH1, CHEK2, MRE11A, MUTYH, NBN, NF1, PALB2, PTEN, RAD50, RAD51C, RAD51D, and TP53.   The report date was 11/18/2013.     UPDATE: PALB2 p.L100F has been reclassified to Likely B   Greater trochanteric bursitis of both hips 04/10/2018   Heart murmur 01/27/2018   History of breast cancer 03/18/2018   Formatting of this note might be different from the original. S/p bilateral mastectomy     History of deep vein thrombosis (DVT) of lower extremity 10/02/2019   Hypertrophic and atrophic condition of skin 07/03/2021   Left  carotid artery stenosis 07/03/2022   Left leg swelling 02/23/2019   Lumbar spondylosis 03/18/2018   Malignant neoplasm of central portion of left female breast    S/p bilateral mastectomy   Malignant neoplasm of female breast (HCC) 02/10/2009   Genetic testing neg; triple neg; S/P bilat mastectomy     Mild cognitive impairment of uncertain or unknown etiology 03/20/2022   Mitral valve prolapse 01/27/2018   Neuropathy 06/17/2021   Obstructive sleep apnea    Variable CPAP use.   Osteopenia after menopause 12/21/2020   Other idiopathic scoliosis, lumbar region 03/18/2018   Pain of left calf 03/14/2019   Palpitations 01/25/2018   Peripheral vascular disease, unspecified 10/29/2020   up to 69% stenosis on the lef internal carotic artery based on cardiac ultrasounds from June 2022   Personal history of colon polyps, unspecified 03/16/2023   Pes planus of left foot 02/23/2019   Pinched nerve 02/10/2021   Ptosis of both upper eyelids 08/27/2022   Had medical necessary surgery for droopy eyelids as a result of  loss of peripheral vision in both eyes.     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 HONOR had suspicions that I may tongue cancer. I had biopsy of tongue. Was only a cyst. No cancer.     Urge incontinence 07/28/2019   Varicose veins of left lower extremity    Ventricular tachycardia 02/28/2019    Past Surgical History:  Procedure Laterality Date   basal cell removed     Head   BUNIONECTOMY Right 1999   eye lid lift Bilateral    done July 2024   MASTECTOMY Bilateral 2012   NASAL SEPTUM SURGERY  1996   SKIN CANCER EXCISION  2018   Top of head    SKIN CANCER EXCISION  07/2019   Nose   TENDON TRANSFER Right 1999    foot   TONGUE BIOPSY  12/04/2021   Dr HONOR    Family History  Problem Relation Age of Onset   Lymphoma Mother 59       deceased 68   Breast cancer Maternal Aunt        Dx 75s; Deceased 89s   Lung cancer Maternal Uncle    Stomach cancer Paternal Uncle        deceased 35   Liver cancer Maternal Uncle    Prostate cancer Other        3 mat cousins   Breast cancer Other 19       mother's mat half-sister   Emphysema Father     Social History:  reports that she has never smoked. She has never used smokeless tobacco. She reports that she does not currently use alcohol. She reports that she does not use drugs.The patient is alone today.  Allergies:  Allergies  Allergen Reactions   Duloxetine Hives, Itching, Nausea Only, Rash, Swelling, Dermatitis and Other (See Comments)    Other Reaction(s): Other (See Comments)   Clarithromycin Diarrhea and Other (See Comments)   Prednisone Other (See Comments)    Passed out Other reaction(s): other   Sertraline Diarrhea   Duloxetine Hcl Itching, Nausea Only, Other (See Comments), Rash and Swelling   Sertraline Hcl Diarrhea    Current Medications: Current Outpatient Medications  Medication Sig Dispense Refill   ascorbic acid (VITAMIN C) 500 MG tablet Take 500 mg by mouth daily.     aspirin 81 MG chewable tablet Chew 81 mg by mouth daily.     atorvastatin  (LIPITOR) 80 MG tablet Take 1 tablet by mouth once daily 90 tablet 2   calcium  carbonate (SUPER CALCIUM ) 1500 (600 Ca) MG TABS tablet Take 1 tablet by mouth daily.     Cholecalciferol 25 MCG (1000 UT) capsule Take 1,000 Units by mouth daily.     dicyclomine (BENTYL) 10 MG capsule Take 10 mg by mouth as needed for spasms (IBS).     diphenoxylate-atropine (LOMOTIL) 2.5-0.025 MG tablet Take 1 tablet by mouth as needed for diarrhea or loose stools.     donepezil (ARICEPT) 10 MG tablet Take 10 mg by mouth at bedtime.     dorzolamide (TRUSOPT) 2 % ophthalmic solution Place 1 drop into the right eye  2 (two) times daily.     ergocalciferol  (VITAMIN D2) 50000 units capsule Take 50,000 Units by mouth  once a week.     ezetimibe  (ZETIA ) 10 MG tablet Take 1 tablet by mouth once daily 90 tablet 3   furosemide  (LASIX ) 20 MG tablet Take 20 mg by mouth daily.     HYDROcodone-acetaminophen (NORCO) 7.5-325 MG tablet Take 1 tablet by mouth 3 (three) times daily.     loratadine (CLARITIN) 10 MG tablet Take 10 mg by mouth daily as needed for allergies.     memantine (NAMENDA) 5 MG tablet Take 5 mg by mouth 2 (two) times daily.     metoprolol  succinate (TOPROL -XL) 50 MG 24 hr tablet Take 1 tablet (50 mg total) by mouth daily. Take with or immediately following a meal. 90 tablet 3   montelukast (SINGULAIR) 10 MG tablet Take 10 mg by mouth at bedtime.     Multiple Vitamins-Minerals (PRESERVISION AREDS 2+MULTI VIT PO) Take 1 tablet by mouth daily.     neomycin-polymyxin b-dexamethasone  (MAXITROL) 3.5-10000-0.1 OINT Place 1 Application into both eyes 3 (three) times daily.     ondansetron  (ZOFRAN ) 4 MG tablet Take 4 mg by mouth every 8 (eight) hours as needed for nausea or vomiting.     senna (SENOKOT) 8.6 MG tablet Take 1 tablet by mouth daily.     solifenacin (VESICARE) 10 MG tablet Take 10 mg by mouth daily.     tamsulosin (FLOMAX) 0.4 MG CAPS capsule Take 0.4 mg by mouth daily after supper.     tiZANidine (ZANAFLEX) 4 MG tablet Take 4 mg by mouth every 8 (eight) hours as needed for muscle spasms.     Triamcinolone  Acetonide (KENALOG -80 IJ) Inject 1 Dose as directed once.     vitamin B-12 (CYANOCOBALAMIN) 1000 MCG tablet Take 1,000 mcg by mouth daily.     Zinc Sulfate 66 MG TABS Take 66 mg by mouth daily.     No current facility-administered medications for this visit.

## 2023-08-16 ENCOUNTER — Encounter: Payer: Self-pay | Admitting: Oncology

## 2023-08-26 ENCOUNTER — Telehealth: Payer: Self-pay | Admitting: Cardiology

## 2023-08-26 NOTE — Telephone Encounter (Signed)
   Pt c/o of Chest Pain: STAT if active CP, including tightness, pressure, jaw pain, radiating pain to shoulder/upper arm/back, CP unrelieved by Nitro. Symptoms reported of SOB, nausea, vomiting, sweating.  1. Are you having CP right now? No    2. Are you experiencing any other symptoms (ex. SOB, nausea, vomiting, sweating)? Sweating and SOB   3. Is your CP continuous or coming and going? Come and go   4. Have you taken Nitroglycerin? No   5. How long have you been experiencing CP? 2-3 weeks    6. If NO CP at time of call then end call with telling Pt to call back or call 911 if Chest pain returns prior to return call from triage team.  Pt c/o medication issue:  1. Name of Medication:   metoprolol  succinate (TOPROL -XL) 50 MG 24 hr tablet    2. How are you currently taking this medication (dosage and times per day)?   Take 1 tablet (50 mg total) by mouth daily. Take with or immediately following a meal.    3. Are you having a reaction (difficulty breathing--STAT)? No  4. What is your medication issue? Pt states that medication is causing her to feel Sluggish. Pt would like a c/b regarding this matter

## 2023-08-27 NOTE — Telephone Encounter (Signed)
 Spoke with patient who states that she has increased chest pressure and shortness of breath when lying down. Pt states that she has been gaining weight daily from fluid. Pt did not have daily weight log. Appointment made for 7/18

## 2023-08-28 ENCOUNTER — Ambulatory Visit

## 2023-08-28 VITALS — BP 112/76 | HR 60 | Ht 64.0 in | Wt 192.4 lb

## 2023-08-28 DIAGNOSIS — G4733 Obstructive sleep apnea (adult) (pediatric): Secondary | ICD-10-CM | POA: Diagnosis not present

## 2023-08-28 DIAGNOSIS — I5032 Chronic diastolic (congestive) heart failure: Secondary | ICD-10-CM | POA: Insufficient documentation

## 2023-08-28 DIAGNOSIS — I350 Nonrheumatic aortic (valve) stenosis: Secondary | ICD-10-CM | POA: Diagnosis not present

## 2023-08-28 DIAGNOSIS — I509 Heart failure, unspecified: Secondary | ICD-10-CM

## 2023-08-28 DIAGNOSIS — I340 Nonrheumatic mitral (valve) insufficiency: Secondary | ICD-10-CM | POA: Insufficient documentation

## 2023-08-28 DIAGNOSIS — I341 Nonrheumatic mitral (valve) prolapse: Secondary | ICD-10-CM | POA: Insufficient documentation

## 2023-08-28 HISTORY — DX: Heart failure, unspecified: I50.9

## 2023-08-28 MED ORDER — METOPROLOL SUCCINATE ER 25 MG PO TB24: 25.0000 mg | ORAL_TABLET | Freq: Every day | ORAL | 3 refills | Status: AC

## 2023-08-28 MED ORDER — FUROSEMIDE 40 MG PO TABS: 40.0000 mg | ORAL_TABLET | Freq: Every day | ORAL | 3 refills | Status: DC

## 2023-08-28 MED ORDER — FUROSEMIDE 20 MG PO TABS: 20.0000 mg | ORAL_TABLET | Freq: Every day | ORAL | 3 refills | Status: DC

## 2023-08-28 NOTE — Progress Notes (Signed)
 Cardiology Consultation:    Date:  08/28/2023   ID:  [name], DOB 1950-06-02, MRN [id number]  PCP:  Keren Vicenta BRAVO, MD  Cardiologist:  Alean SAUNDERS Cassidy Tabet, MD   Referring MD: Keren Vicenta BRAVO, MD   No chief complaint on file.    ASSESSMENT AND PLAN:   Ms. Pyon 73 year old woman history of mild nonobstructive coronary artery disease on cardiac CT from April 2024 [with calcium  score 215], carotid artery disease left 40 to 50% [follows up with vascular surgery], aortic stenosis, mitral valve prolapse on echocardiogram, TIA, NSVT, history of DVT remains on low-dose aspirin, COPD, breast cancer, OSA-CPAP, echocardiogram from 05/21/2023 notes EF 60 to 65%, mild LVH, grade 2 diastolic dysfunction, RVSP estimated 14 mmHg, trace MR, mild to moderate aortic stenosis with mean V-max 2.75 m/s and valve area 1.2 cm.   Reached out to our office for symptoms of shortness of breath and overall sense of tiredness, associated with orthopnea.  Lasix  dose was recently increased by PCP to 40 mg once daily and after 2 doses she feels slightly better.  Problem List Items Addressed This Visit     Mild mitral regurgitation - Primary   Trace to mild mitral regurgitation on echocardiogram in April 2025.      Relevant Medications   metoprolol  succinate (TOPROL  XL) 25 MG 24 hr tablet   furosemide  (LASIX ) 20 MG tablet   Obstructive sleep apnea   Relevant Orders   EKG 12-Lead (Completed)   Basic Metabolic Panel (BMET)   CBC   Aortic stenosis   Mild to moderate stenosis on echocardiogram April 2025, valve area 1.2 cm. Symptoms more likely related to heart rate response and diastolic heart failure. Will adjust diuretic dose and metoprolol  dose as discussed above.        Relevant Medications   metoprolol  succinate (TOPROL  XL) 25 MG 24 hr tablet   furosemide  (LASIX ) 20 MG tablet   CHF (congestive heart failure) (HCC)   Chronic diastolic CHF with preserved LVEF 60 to 65% on  echocardiogram April 2025.  Does not significantly appear volume overloaded peripherally but does have significant diastolic dysfunction and likely causing pulmonary congestion and her symptoms. In addition relatively slow heart rates but contributing to her overall symptoms.  Will back off on the metoprolol  succinate dose to 25 mg once daily. Increase the diuretic dose by increasing Lasix  to 40 mg every day in the morning and an additional 40 mg dose in the afternoon 3 days a week.  Will have her return to the office for blood work BMP, magnesium and CBC tentatively in 10 to 12 days and follow-up in the office for interval assessment and 2 weeks.  She is agreeable with this plan.       Relevant Medications   metoprolol  succinate (TOPROL  XL) 25 MG 24 hr tablet   furosemide  (LASIX ) 20 MG tablet    Return to clinic tentatively in 2 weeks.  History of Present Illness:    Malea Swilling is a 73 y.o. female who is being seen today for follow-up visit. PCP is Keren Vicenta BRAVO, MD. Last visit with us  in the office was 04-30-2023 with Delon Hoover, NP-C.  Has history of mild nonobstructive coronary artery disease on cardiac CT from April 2024 [with calcium  score 215], carotid artery disease left 40 to 50% [follows up with vascular surgery], aortic stenosis, mitral valve prolapse on echocardiogram, TIA, NSVT, history of DVT remains on low-dose aspirin, COPD, breast cancer, OSA-CPAP, echocardiogram from  05/21/2023 notes EF 60 to 65%, mild LVH, grade 2 diastolic dysfunction, RVSP estimated 14 mmHg, trace MR, mild to moderate aortic stenosis with mean V-max 2.75 m/s and valve area 1.2 cm.  Mentions overall she has been feeling tired for the last couple months since metoprolol  XL dose had been increased to 50 mg once daily. She also describes symptoms of shortness of breath when she lays down and has to lay reclined to help with the breathing.  Denies any symptoms of chest pain at  rest. Denies any significant weight gain and weight around 191 to 192 pounds at home.  Denies any palpitations. Denies any lightheadedness, syncopal episodes.  Mentions she tends to be adherent with salt restriction. Drinks up to 60 ounces of fluid a day.  EKG in the clinic today shows sinus rhythm heart rate 60/min, PR interval 204 ms, QRS duration 98 ms, QTc 464 ms.  No significant ischemic changes.  EKG in comparison to April 30, 2023 no significant change   Past Medical History:  Diagnosis Date   Acute deep vein thrombosis (DVT) of left peroneal vein 04/11/2019   Aortic stenosis 04/12/2021   Basal cell carcinoma 07/25/2021   Basal cell carcinoma (BCC) in situ of skin 02/14/2021   Had several places of basal cell carcinoma removed on top of head over the last 2 to 3 years.     Cervical spondylosis 03/18/2018   Chronic bilateral low back pain without sciatica 03/18/2018   Chronic foot pain, left 03/14/2019   Possible fracture base 2nd metatarsal   Chronic neck pain 03/18/2018   COPD (chronic obstructive pulmonary disease)    DDD (degenerative disc disease), cervical 03/18/2018   Displaced fracture of fifth metatarsal bone, right foot 03/2020   DVT femoral (deep venous thrombosis) with thrombophlebitis, left (HCC) 04/11/2019   Dyspnea on exertion 01/27/2018   Facet arthritis, degenerative, lumbar spine 03/18/2018   Family history of malignant neoplasm of breast    Generalized anxiety disorder 01/24/2019   Possible history of panic attacks   Genetic testing 11/14/2020   Negative genetic testing on the BreastNext panel.  PALB2 p.L100F VUS.  The BreastNext gene panel offered by [facility] includes sequencing and rearrangement analysis for the following 17 genes:  ATM, BARD1, BRCA1, BRCA2, BRIP1, CDH1, CHEK2, MRE11A, MUTYH, NBN, NF1, PALB2, PTEN, RAD50, RAD51C, RAD51D, and TP53.   The report date was 11/18/2013.     UPDATE: PALB2 p.L100F has been reclassified to Likely B   Greater  trochanteric bursitis of both hips 04/10/2018   Heart murmur 01/27/2018   History of breast cancer 03/18/2018   Formatting of this note might be different from the original. S/p bilateral mastectomy     History of deep vein thrombosis (DVT) of lower extremity 10/02/2019   Hypertrophic and atrophic condition of skin 07/03/2021   Left carotid artery stenosis 07/03/2022   Left leg swelling 02/23/2019   Lumbar spondylosis 03/18/2018   Malignant neoplasm of central portion of left female breast    S/p bilateral mastectomy   Malignant neoplasm of female breast (HCC) 02/10/2009   Genetic testing neg; triple neg; S/P bilat mastectomy     Mild cognitive impairment of uncertain or unknown etiology 03/20/2022   Mitral valve prolapse 01/27/2018   Neuropathy 06/17/2021   Obstructive sleep apnea    Variable CPAP use.   Osteopenia after menopause 12/21/2020   Other idiopathic scoliosis, lumbar region 03/18/2018   Pain of left calf 03/14/2019   Palpitations 01/25/2018   Peripheral vascular disease,  unspecified 10/29/2020   up to 69% stenosis on the lef internal carotic artery based on cardiac ultrasounds from June 2022   Personal history of colon polyps, unspecified 03/16/2023   Pes planus of left foot 02/23/2019   Pinched nerve 02/10/2021   Ptosis of both upper eyelids 08/27/2022   Had medical necessary surgery for droopy eyelids as a result of loss of peripheral vision in both eyes.     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 HONOR had suspicions that I may tongue cancer. I had biopsy of tongue. Was only a cyst. No cancer.     Urge incontinence 07/28/2019   Varicose veins of left lower extremity    Ventricular tachycardia  02/28/2019    Past Surgical History:  Procedure Laterality Date   basal cell removed     Head   BUNIONECTOMY Right 1999   eye lid lift Bilateral    done July 2024   MASTECTOMY Bilateral 2012   NASAL SEPTUM SURGERY  1996   SKIN CANCER EXCISION  2018   Top of head    SKIN CANCER EXCISION  07/2019   Nose   TENDON TRANSFER Right 1999   foot   TONGUE BIOPSY  12/04/2021   Dr HONOR    Current Medications: Current Meds  Medication Sig   ascorbic acid (VITAMIN C) 500 MG tablet Take 500 mg by mouth daily.   aspirin 81 MG chewable tablet Chew 81 mg by mouth daily.   atorvastatin  (LIPITOR) 80 MG tablet Take 1 tablet by mouth once daily   Biotin 800 MCG TABS daily at 6 (six) AM.   calcium  carbonate (SUPER CALCIUM ) 1500 (600 Ca) MG TABS tablet Take 1 tablet by mouth daily.   Cholecalciferol 25 MCG (1000 UT) capsule Take 1,000 Units by mouth daily.   dicyclomine (BENTYL) 10 MG capsule Take 10 mg by mouth as needed for spasms (IBS).   diphenoxylate-atropine (LOMOTIL) 2.5-0.025 MG tablet Take 1 tablet by mouth as needed for diarrhea or loose stools.   donepezil (ARICEPT) 10 MG tablet Take 10 mg by mouth at bedtime.   dorzolamide (TRUSOPT) 2 % ophthalmic solution Place 1 drop into the right eye 2 (two) times daily.   ergocalciferol  (VITAMIN D2) 50000 units capsule Take 50,000 Units by mouth once a week.   ezetimibe  (ZETIA ) 10 MG tablet Take 1 tablet by mouth once daily   furosemide  (LASIX ) 20 MG tablet Take 1 tablet (20 mg total) by mouth daily. Take an additional 40 mg in the PM 3 times per week.   HYDROcodone-acetaminophen (NORCO) 7.5-325 MG tablet Take 1 tablet by mouth 3 (three) times daily.   loratadine (CLARITIN) 10 MG tablet Take 10 mg by mouth daily as needed for allergies.   memantine (NAMENDA) 5 MG tablet Take 5 mg by mouth 2 (two) times daily.   metoprolol  succinate (TOPROL  XL) 25 MG 24 hr tablet Take 1 tablet (25 mg total) by mouth daily.   montelukast (SINGULAIR) 10 MG tablet Take  10 mg by mouth at bedtime.   Multiple Vitamins-Minerals (PRESERVISION AREDS 2+MULTI VIT PO) Take 1 tablet by mouth daily.   neomycin-polymyxin b-dexamethasone  (MAXITROL) 3.5-10000-0.1 OINT Place 1 Application into both eyes 3 (three) times daily.   ondansetron  (ZOFRAN )  4 MG tablet Take 4 mg by mouth every 8 (eight) hours as needed for nausea or vomiting.   senna (SENOKOT) 8.6 MG tablet Take 1 tablet by mouth daily.   solifenacin (VESICARE) 10 MG tablet Take 10 mg by mouth daily.   tamsulosin (FLOMAX) 0.4 MG CAPS capsule Take 0.4 mg by mouth daily after supper.   tiZANidine (ZANAFLEX) 4 MG tablet Take 4 mg by mouth every 8 (eight) hours as needed for muscle spasms.   Triamcinolone  Acetonide (KENALOG -80 IJ) Inject 1 Dose as directed once.   vitamin B-12 (CYANOCOBALAMIN) 1000 MCG tablet Take 1,000 mcg by mouth daily.   Zinc Sulfate 66 MG TABS Take 66 mg by mouth daily.   [DISCONTINUED] furosemide  (LASIX ) 20 MG tablet Take 20 mg by mouth daily. (Patient taking differently: Take 40 mg by mouth daily.)   [DISCONTINUED] metoprolol  succinate (TOPROL -XL) 50 MG 24 hr tablet Take 1 tablet (50 mg total) by mouth daily. Take with or immediately following a meal.     Allergies:   Duloxetine, Clarithromycin, Prednisone, Sertraline, Duloxetine hcl, and Sertraline hcl   Social History   Socioeconomic History   Marital status: Single    Spouse name: Not on file   Number of children: 0   Years of education: 16   Highest education level: Bachelor's degree (e.g., BA, AB, BS)  Occupational History   Occupation: Retired  Tobacco Use   Smoking status: Never   Smokeless tobacco: Never  Vaping Use   Vaping status: Never Used  Substance and Sexual Activity   Alcohol use: Not Currently   Drug use: Never   Sexual activity: Not Currently  Other Topics Concern   Not on file  Social History Narrative   Pt is single she lives alone has no children   Right handed   Social Drivers of Research scientist (physical sciences) Strain: Low Risk  (06/22/2023)   Received from [facility]   Overall Financial Resource Strain (CARDIA)    Difficulty of Paying Living Expenses: Not hard at all  Food Insecurity: No Food Insecurity (06/22/2023)   Received from [facility]   Hunger Vital Sign    Within the past 12 months, you worried that your food would run out before you got the money to buy more.: Never true    Within the past 12 months, the food you bought just didn't last and you didn't have money to get more.: Never true  Transportation Needs: No Transportation Needs (06/22/2023)   [facility] - Transportation    Lack of Transportation (Medical): No    Lack of Transportation (Non-Medical): No  Physical Activity: Not on file  Stress: Not on file  Social Connections: Not on file     Family History: [name] history includes Breast cancer in her maternal aunt; Breast cancer (age of onset: 17) in an other family member; Emphysema in her father; Liver cancer in her maternal uncle; Lung cancer in her maternal uncle; Lymphoma (age of onset: 21) in her mother; Prostate cancer in an other family member; Stomach cancer in her paternal uncle. ROS:   Please see the history of present illness.    All 14 point review of systems negative except as described per history of present illness.  EKGs/Labs/Other Studies Reviewed:    The following studies were reviewed today:   EKG:  EKG Interpretation Date/Time:  Friday August 28 2023 15:41:44 EDT Ventricular Rate:  60 PR Interval:  204 QRS Duration:  98 QT Interval:  464 QTC Calculation: 464 R Axis:   71  Text Interpretation: Normal sinus rhythm Normal ECG When compared with ECG of 30-Apr-2023 15:50, No significant change was found Confirmed by Liborio Hai reddy [phone]) on 08/28/2023 3:50:04 PM    Recent Labs: No results found for requested labs within last 365 days.  Recent Lipid Panel    Component Value Date/Time    CHOL 127 07/30/2022 0941   TRIG 49 07/30/2022 0941   HDL 53 07/30/2022 0941   CHOLHDL 2.4 07/30/2022 0941   LDLCALC 63 07/30/2022 0941   LDLDIRECT 89 10/29/2020 1038    Physical Exam:    VS:  BP 112/76 (BP Location: Right Arm)   Pulse 60   Ht 5' 4 (1.626 m)   Wt 192 lb 6.4 oz (87.3 kg)   SpO2 96%   BMI 33.03 kg/m     Wt Readings from Last 3 Encounters:  08/28/23 192 lb 6.4 oz (87.3 kg)  07/31/23 196 lb 1.6 oz (89 kg)  04/30/23 191 lb 9.6 oz (86.9 kg)     GENERAL:  Well nourished, well developed in no acute distress NECK: No JVD; No carotid bruits CARDIAC: RRR, S1 and S2 present, 4/6 systolic murmur best heard left parasternal area and right upper sternal border.  CHEST:  Clear to auscultation without rales, wheezing or rhonchi  Extremities: No pitting pedal edema. Pulses bilaterally symmetric with radial 2+ and dorsalis pedis 2+ NEUROLOGIC:  Alert and oriented x 3  Medication Adjustments/Labs and Tests Ordered: Current medicines are reviewed at length with the patient today.  Concerns regarding medicines are outlined above.  Orders Placed This Encounter  Procedures   Basic Metabolic Panel (BMET)   CBC   EKG 12-Lead   Meds ordered this encounter  Medications   metoprolol  succinate (TOPROL  XL) 25 MG 24 hr tablet    Sig: Take 1 tablet (25 mg total) by mouth daily.    Dispense:  90 tablet    Refill:  3   furosemide  (LASIX ) 20 MG tablet    Sig: Take 1 tablet (20 mg total) by mouth daily. Take an additional 40 mg in the PM 3 times per week.    Dispense:  180 tablet    Refill:  3    Signed, Lofton Leon reddy Linnell Swords, MD, MPH, [facility] 08/28/2023 4:29 PM    [facility]

## 2023-08-28 NOTE — Assessment & Plan Note (Signed)
 Trace to mild mitral regurgitation on echocardiogram in April 2025.

## 2023-08-28 NOTE — Patient Instructions (Addendum)
 Medication Instructions:  Your physician has recommended you make the following change in your medication:   START: Metoprolol  succinate 25 mg daily START: Lasix  40 mg daily (Take an additional 40 mg in the afternoon 3 times per week)  *If you need a refill on your cardiac medications before your next appointment, please call your pharmacy*  Lab Work: Your physician recommends that you return for lab work in:   Labs 2 days before next appointment: BMP, CBC  If you have labs (blood work) drawn today and your tests are completely normal, you will receive your results only by: MyChart Message (if you have MyChart) OR A paper copy in the mail If you have any lab test that is abnormal or we need to change your treatment, we will call you to review the results.  Testing/Procedures: None  Follow-Up: At [facility], you and your health needs are our priority.  As part of our continuing mission to provide you with exceptional heart care, our providers are all part of one team.  This team includes your primary Cardiologist (physician) and Advanced Practice Providers or APPs (Physician Assistants and Nurse Practitioners) who all work together to provide you with the care you need, when you need it.  Your next appointment:   2 week(s)  Provider:   Delon Hoover, NP [place])    We recommend signing up for the patient portal called MyChart.  Sign up information is provided on this After Visit Summary.  MyChart is used to connect with patients for Virtual Visits (Telemedicine).  Patients are able to view lab/test results, encounter notes, upcoming appointments, etc.  Non-urgent messages can be sent to your provider as well.   To learn more about what you can do with MyChart, go to [email or web address].   Other Instructions None

## 2023-08-28 NOTE — Assessment & Plan Note (Signed)
 Mild to moderate stenosis on echocardiogram April 2025, valve area 1.2 cm. Symptoms more likely related to heart rate response and diastolic heart failure. Will adjust diuretic dose and metoprolol  dose as discussed above.

## 2023-08-28 NOTE — Assessment & Plan Note (Signed)
 Chronic diastolic CHF with preserved LVEF 60 to 65% on echocardiogram April 2025.  Does not significantly appear volume overloaded peripherally but does have significant diastolic dysfunction and likely causing pulmonary congestion and her symptoms. In addition relatively slow heart rates but contributing to her overall symptoms.  Will back off on the metoprolol  succinate dose to 25 mg once daily. Increase the diuretic dose by increasing Lasix  to 40 mg every day in the morning and an additional 40 mg dose in the afternoon 3 days a week.  Will have her return to the office for blood work BMP, magnesium and CBC tentatively in 10 to 12 days and follow-up in the office for interval assessment and 2 weeks.  She is agreeable with this plan.

## 2023-08-31 DIAGNOSIS — H524 Presbyopia: Secondary | ICD-10-CM | POA: Diagnosis not present

## 2023-08-31 DIAGNOSIS — H25813 Combined forms of age-related cataract, bilateral: Secondary | ICD-10-CM | POA: Diagnosis not present

## 2023-08-31 DIAGNOSIS — H52213 Irregular astigmatism, bilateral: Secondary | ICD-10-CM | POA: Diagnosis not present

## 2023-09-01 ENCOUNTER — Institutional Professional Consult (permissible substitution): Payer: Medicare Other | Admitting: Psychology

## 2023-09-01 ENCOUNTER — Ambulatory Visit: Payer: Self-pay

## 2023-09-08 DIAGNOSIS — G4733 Obstructive sleep apnea (adult) (pediatric): Secondary | ICD-10-CM | POA: Diagnosis not present

## 2023-09-08 DIAGNOSIS — I341 Nonrheumatic mitral (valve) prolapse: Secondary | ICD-10-CM | POA: Diagnosis not present

## 2023-09-09 ENCOUNTER — Encounter: Payer: Medicare Other | Admitting: Psychology

## 2023-09-09 LAB — BASIC METABOLIC PANEL WITH GFR
BUN/Creatinine Ratio: 18 (ref 12–28)
BUN: 18 mg/dL (ref 8–27)
CO2: 24 mmol/L (ref 20–29)
Calcium: 9.4 mg/dL (ref 8.7–10.3)
Chloride: 104 mmol/L (ref 96–106)
Creatinine, Ser: 0.98 mg/dL (ref 0.57–1.00)
Glucose: 100 mg/dL — ABNORMAL HIGH (ref 70–99)
Potassium: 4.1 mmol/L (ref 3.5–5.2)
Sodium: 145 mmol/L — ABNORMAL HIGH (ref 134–144)
eGFR: 61 mL/min/1.73 (ref >59)

## 2023-09-09 LAB — CBC
Hematocrit: 41 % (ref 34.0–46.6)
Hemoglobin: 12.7 g/dL (ref 11.1–15.9)
MCH: 30 pg (ref 26.6–33.0)
MCHC: 31 g/dL — ABNORMAL LOW (ref 31.5–35.7)
MCV: 97 fL (ref 79–97)
Platelets: 209 x10E3/uL (ref 150–450)
RBC: 4.24 x10E6/uL (ref 3.77–5.28)
RDW: 13 % (ref 11.7–15.4)
WBC: 5.1 x10E3/uL (ref 3.4–10.8)

## 2023-09-14 DIAGNOSIS — R300 Dysuria: Secondary | ICD-10-CM | POA: Diagnosis not present

## 2023-09-14 NOTE — Progress Notes (Unsigned)
 Cardiology Office Note:    Date:  09/16/2023   ID:  Karen Shannon, DOB 22-Oct-1950, MRN [id number]  PCP:  Keren Vicenta BRAVO, MD   [facility] Providers Cardiologist:  Lamar Fitch, MD     Referring MD: Keren Vicenta BRAVO, MD     History of Present Illness:    Karen Shannon is a 73 y.o. female with a hx of nonobstructive CAD per coronary CTA in 2024, DVT--could not afford Eliquis and takes aspirin, carotid artery stenosis followed by [facility], mitral valve prolapse, TIA, ventricular tachycardia, PVD, moderate aortic stenosis, OSA on CPAP, COPD, breast cancer 2012, palpitations.  06/08/2023 monitor average heart rate 69 bpm, predominant rhythm was sinus, first-degree AV block present, 6 episodes of VT >> recommended to change from metoprolol  succinate to tartrate 05/21/2023 echo EF 60 to 65%, mild LVH, grade 2 DD, mild to moderate aortic valve stenosis 04/16/2023 carotid duplex mild bilateral carotid artery stenosis 05/2022 coronary CTA calcium  score of 215 revealing mild nonobstructive CAD. 04/15/2022 carotid duplex revealed 40 to 50% left ICA stenosis, right normal without stenosis. 11/01/2021 echo EF 55 to 60%, grade 2 DD, no evidence of mitral valve regurgitation, mild thickening of the aortic valve. 04/05/2019 lexiscan  normal, low risk 02/20/2019 average HR 78 bpm, 3 episodes of NSVT, SVT  She initially established care with Dr. Fitch on 01/27/2018 at the behest of her PCP for evaluation of palpitations and dizziness.  Previously she had been seen by Dr. Cephas, was diagnosed with MVP and experienced palpitations at that time as well.  Echo at this time revealed an EF of 55 to 60%, grade 1 DD.  She wore a Holter monitor in January 2020 which revealed normal sinus rhythm, infrequent PVCs and PACs.  Repeat monitor following CVA in 2021 revealed 3 episodes of ventricular tachycardia lasting 12 beats, however she was asymptomatic.  She underwent a stress test for further  evaluation of her ventricular tachycardia, study was normal, low risk.   Evaluated by Dr. Liborio on 08/28/2023 for shortness of breath, fatigue--her Lasix  had recently been increased to 40 once daily by her PCP and she was feeling somewhat better.  At our office visit, her metoprolol  was decreased to 25 mg once daily, her Lasix  was changed to twice daily 3 times per week and once daily otherwise, with plans for 2-week follow-up for change in symptoms.  She presents today for follow up, she is doing ok since medication adjustments, blood pressure is well controlled and she is down four pounds. She questions if she has heart failure as she saw notation in her chart. She denies chest pain, palpitations, dyspnea, pnd, orthopnea, n, v, dizziness, syncope, edema, weight gain, or early satiety.      Past Medical History:  Diagnosis Date   Acute deep vein thrombosis (DVT) of left peroneal vein 04/11/2019   Aortic stenosis 04/12/2021   Basal cell carcinoma 07/25/2021   Basal cell carcinoma (BCC) in situ of skin 02/14/2021   Had several places of basal cell carcinoma removed on top of head over the last 2 to 3 years.     Cervical spondylosis 03/18/2018   Chronic bilateral low back pain without sciatica 03/18/2018   Chronic foot pain, left 03/14/2019   Possible fracture base 2nd metatarsal   Chronic neck pain 03/18/2018   COPD (chronic obstructive pulmonary disease)    DDD (degenerative disc disease), cervical 03/18/2018   Displaced fracture of fifth metatarsal bone, right foot 03/2020   DVT femoral (deep  venous thrombosis) with thrombophlebitis, left (HCC) 04/11/2019   Dyspnea on exertion 01/27/2018   Facet arthritis, degenerative, lumbar spine 03/18/2018   Family history of malignant neoplasm of breast    Generalized anxiety disorder 01/24/2019   Possible history of panic attacks   Genetic testing 11/14/2020   Negative genetic testing on the BreastNext panel.  PALB2 p.L100F VUS.  The  BreastNext gene panel offered by [employer] includes sequencing and rearrangement analysis for the following 17 genes:  ATM, BARD1, BRCA1, BRCA2, BRIP1, CDH1, CHEK2, MRE11A, MUTYH, NBN, NF1, PALB2, PTEN, RAD50, RAD51C, RAD51D, and TP53.   The report date was 11/18/2013.     UPDATE: PALB2 p.L100F has been reclassified to Likely B   Greater trochanteric bursitis of both hips 04/10/2018   Heart murmur 01/27/2018   History of breast cancer 03/18/2018   Formatting of this note might be different from the original. S/p bilateral mastectomy     History of deep vein thrombosis (DVT) of lower extremity 10/02/2019   Hypertrophic and atrophic condition of skin 07/03/2021   Left carotid artery stenosis 07/03/2022   Left leg swelling 02/23/2019   Lumbar spondylosis 03/18/2018   Malignant neoplasm of central portion of left female breast    S/p bilateral mastectomy   Malignant neoplasm of female breast (HCC) 02/10/2009   Genetic testing neg; triple neg; S/P bilat mastectomy     Mild cognitive impairment of uncertain or unknown etiology 03/20/2022   Mitral valve prolapse 01/27/2018   Neuropathy 06/17/2021   Obstructive sleep apnea    Variable CPAP use.   Osteopenia after menopause 12/21/2020   Other idiopathic scoliosis, lumbar region 03/18/2018   Pain of left calf 03/14/2019   Palpitations 01/25/2018   Peripheral vascular disease, unspecified 10/29/2020   up to 69% stenosis on the lef internal carotic artery based on cardiac ultrasounds from June 2022   Personal history of colon polyps, unspecified 03/16/2023   Pes planus of left foot 02/23/2019   Pinched nerve 02/10/2021   Ptosis of both upper eyelids 08/27/2022   Had medical necessary surgery for droopy eyelids as a result of loss of peripheral vision in both eyes.     Rib pain on right side 01/21/2023   Right lumbar radiculopathy 03/19/2021   Sprain of tarsometatarsal ligament of right foot 07/14/2019   Stasis dermatitis, acute 10/03/2019    Tenosynovitis of left ankle 04/11/2019   TIA (transient ischemic attack) 01/17/2019   Tongue adhesions, congenital 12/04/2021   Dr. HONOR had suspicions that I may tongue cancer. I had biopsy of tongue. Was only a cyst. No cancer.     Urge incontinence 07/28/2019   Varicose veins of left lower extremity    Ventricular tachycardia 02/28/2019    Past Surgical History:  Procedure Laterality Date   basal cell removed     Head   BUNIONECTOMY Right 1999   eye lid lift Bilateral    done July 2024   MASTECTOMY Bilateral 2012   NASAL SEPTUM SURGERY  1996   SKIN CANCER EXCISION  2018   Top of head    SKIN CANCER EXCISION  07/2019   Nose   TENDON TRANSFER Right 1999   foot   TONGUE BIOPSY  12/04/2021   Dr HONOR    Current Medications: Current Meds  Medication Sig   ascorbic acid (VITAMIN C) 500 MG tablet Take 500 mg by mouth daily.   aspirin 81 MG chewable tablet Chew 81 mg by mouth daily.   atorvastatin  (LIPITOR) 80 MG tablet  Take 1 tablet by mouth once daily   Biotin 800 MCG TABS daily at 6 (six) AM.   calcium  carbonate (SUPER CALCIUM ) 1500 (600 Ca) MG TABS tablet Take 1 tablet by mouth daily.   Cholecalciferol 25 MCG (1000 UT) capsule Take 1,000 Units by mouth daily.   dicyclomine (BENTYL) 10 MG capsule Take 10 mg by mouth as needed for spasms (IBS).   diphenoxylate-atropine (LOMOTIL) 2.5-0.025 MG tablet Take 1 tablet by mouth as needed for diarrhea or loose stools.   donepezil (ARICEPT) 10 MG tablet Take 10 mg by mouth at bedtime.   dorzolamide (TRUSOPT) 2 % ophthalmic solution Place 1 drop into the right eye 2 (two) times daily.   ergocalciferol  (VITAMIN D2) 50000 units capsule Take 50,000 Units by mouth once a week.   ezetimibe  (ZETIA ) 10 MG tablet Take 1 tablet by mouth once daily   furosemide  (LASIX ) 40 MG tablet Take 1 tablet (40 mg total) by mouth daily. Take an additional 40 mg 3 times per week in the afternoon   HYDROcodone-acetaminophen (NORCO) 7.5-325 MG tablet Take 1 tablet  by mouth 3 (three) times daily.   loratadine (CLARITIN) 10 MG tablet Take 10 mg by mouth daily as needed for allergies.   memantine (NAMENDA) 5 MG tablet Take 5 mg by mouth 2 (two) times daily.   metoprolol  succinate (TOPROL  XL) 25 MG 24 hr tablet Take 1 tablet (25 mg total) by mouth daily.   montelukast (SINGULAIR) 10 MG tablet Take 10 mg by mouth at bedtime.   Multiple Vitamins-Minerals (PRESERVISION AREDS 2+MULTI VIT PO) Take 1 tablet by mouth daily.   neomycin-polymyxin b-dexamethasone  (MAXITROL) 3.5-10000-0.1 OINT Place 1 Application into both eyes 3 (three) times daily.   nitrofurantoin, macrocrystal-monohydrate, (MACROBID) 100 MG capsule Take 100 mg by mouth 2 (two) times daily.   ondansetron  (ZOFRAN ) 4 MG tablet Take 4 mg by mouth every 8 (eight) hours as needed for nausea or vomiting.   senna (SENOKOT) 8.6 MG tablet Take 1 tablet by mouth daily.   solifenacin (VESICARE) 10 MG tablet Take 10 mg by mouth daily.   tamsulosin (FLOMAX) 0.4 MG CAPS capsule Take 0.4 mg by mouth daily after supper.   tiZANidine (ZANAFLEX) 4 MG tablet Take 4 mg by mouth every 8 (eight) hours as needed for muscle spasms.   Triamcinolone  Acetonide (KENALOG -80 IJ) Inject 1 Dose as directed once.   vitamin B-12 (CYANOCOBALAMIN) 1000 MCG tablet Take 1,000 mcg by mouth daily.   Zinc Sulfate 66 MG TABS Take 66 mg by mouth daily.     Allergies:   Duloxetine, Clarithromycin, Prednisone, Sertraline, Duloxetine hcl, and Sertraline hcl   Social History   Socioeconomic History   Marital status: Single    Spouse name: Not on file   Number of children: 0   Years of education: 16   Highest education level: Bachelor's degree (e.g., BA, AB, BS)  Occupational History   Occupation: Retired  Tobacco Use   Smoking status: Never   Smokeless tobacco: Never  Vaping Use   Vaping status: Never Used  Substance and Sexual Activity   Alcohol use: Not Currently   Drug use: Never   Sexual activity: Not Currently  Other Topics  Concern   Not on file  Social History Narrative   Pt is single she lives alone has no children   Right handed   Social Drivers of Health   Financial Resource Strain: Low Risk  (06/22/2023)   Received from [facility]   Overall Financial Resource  Strain (CARDIA)    Difficulty of Paying Living Expenses: Not hard at all  Food Insecurity: No Food Insecurity (06/22/2023)   Received from [facility]   Hunger Vital Sign    Within the past 12 months, you worried that your food would run out before you got the money to buy more.: Never true    Within the past 12 months, the food you bought just didn't last and you didn't have money to get more.: Never true  Transportation Needs: No Transportation Needs (06/22/2023)   Received from [facility] - Transportation    Lack of Transportation (Medical): No    Lack of Transportation (Non-Medical): No  Physical Activity: Not on file  Stress: Not on file  Social Connections: Not on file     Family History: [name] history includes Breast cancer in her maternal aunt; Breast cancer (age of onset: 45) in an other family member; Emphysema in her father; Liver cancer in her maternal uncle; Lung cancer in her maternal uncle; Lymphoma (age of onset: 54) in her mother; Prostate cancer in an other family member; Stomach cancer in her paternal uncle.  ROS:   Please see the history of present illness.     All other systems reviewed and are negative.  EKGs/Labs/Other Studies Reviewed:    The following studies were reviewed today: Cardiac Studies & Procedures   ______________________________________________________________________________________________   STRESS TESTS  MYOCARDIAL PERFUSION IMAGING 04/05/2019  Interpretation Summary  The left ventricular ejection fraction is hyperdynamic (>65%).  Nuclear stress EF: 66%.  There was no ST segment deviation noted during stress.  Defect 1: There is a defect present in the  basal inferior and mid inferior location. Suggests diaphragmatic attenuation.  The study is normal.  This is a low risk study.   ECHOCARDIOGRAM  ECHOCARDIOGRAM COMPLETE 05/21/2023  Narrative ECHOCARDIOGRAM REPORT    Patient Name:   Karen Shannon Date of Exam: 05/21/2023 Medical Rec #:  [id number]            Height:       64.0 in Accession #:    [id number]           Weight:       191.6 lb Date of Birth:  12/09/1950             BSA:          1.921 m Patient Age:    72 years             BP:           124/76 mmHg Patient Gender: F                    HR:           54 bpm. Exam Location:  [place]  Procedure: 2D Echo, Cardiac Doppler, Color Doppler and Strain Analysis (Both Spectral and Color Flow Doppler were utilized during procedure).  Indications:    Dyspnea R06.00  History:        Patient has prior history of Echocardiogram examinations, most recent 11/02/2021. TIA, COPD and Peripheral vascular disease.  Sonographer:    Lynwood Silvas RDCS Referring Phys: [id number] DELON BROCKS Edker Punt  IMPRESSIONS   1. Left ventricular ejection fraction, by estimation, is 60 to 65%. The left ventricle has normal function. The left ventricle has no regional wall motion abnormalities. There is mild left ventricular hypertrophy. Left ventricular diastolic parameters are consistent with Grade II diastolic dysfunction (pseudonormalization). The  average left ventricular global longitudinal strain is -15.0 %. The global longitudinal strain is abnormal. 2. Right ventricular systolic function is normal. The right ventricular size is normal. There is normal pulmonary artery systolic pressure. The estimated right ventricular systolic pressure is 14.2 mmHg. 3. The mitral valve is grossly normal. Trivial mitral valve regurgitation. No evidence of mitral stenosis. 4. The aortic valve is grossly normal. Aortic valve regurgitation is trivial. Mild to moderate aortic valve stenosis. Aortic valve area, by VTI measures  1.20 cm. Aortic valve mean gradient measures 17.0 mmHg. Aortic valve Vmax measures 2.75 m/s. 5. The inferior vena cava is normal in size with greater than 50% respiratory variability, suggesting right atrial pressure of 3 mmHg.  Comparison(s): In comparison prior echocardiogram report from 11-01-2021 notes LVEF 55 to 60%, mild aortic stenosis with mean gradient 16 mmHg.  FINDINGS Left Ventricle: Left ventricular ejection fraction, by estimation, is 60 to 65%. The left ventricle has normal function. The left ventricle has no regional wall motion abnormalities. The average left ventricular global longitudinal strain is -15.0 %. Strain was performed and the global longitudinal strain is abnormal. The left ventricular internal cavity size was normal in size. There is mild left ventricular hypertrophy. Left ventricular diastolic parameters are consistent with Grade II diastolic dysfunction (pseudonormalization).  Right Ventricle: The right ventricular size is normal. Right vetricular wall thickness was not well visualized. Right ventricular systolic function is normal. There is normal pulmonary artery systolic pressure. The tricuspid regurgitant velocity is 1.67 m/s, and with an assumed right atrial pressure of 3 mmHg, the estimated right ventricular systolic pressure is 14.2 mmHg.  Left Atrium: Left atrial size was normal in size.  Right Atrium: Right atrial size was normal in size.  Pericardium: There is no evidence of pericardial effusion.  Mitral Valve: The mitral valve is grossly normal. Trivial mitral valve regurgitation. No evidence of mitral valve stenosis.  Tricuspid Valve: The tricuspid valve is grossly normal. Tricuspid valve regurgitation is mild . No evidence of tricuspid stenosis.  Aortic Valve: The aortic valve is grossly normal. Aortic valve regurgitation is trivial. Aortic regurgitation PHT measures 880 msec. Mild to moderate aortic stenosis is present. Aortic valve mean gradient  measures 17.0 mmHg. Aortic valve peak gradient measures 30.2 mmHg. Aortic valve area, by VTI measures 1.20 cm.  Pulmonic Valve: The pulmonic valve was grossly normal. Pulmonic valve regurgitation is trivial. No evidence of pulmonic stenosis.  Aorta: The aortic root and ascending aorta are structurally normal, with no evidence of dilitation.  Venous: The inferior vena cava is normal in size with greater than 50% respiratory variability, suggesting right atrial pressure of 3 mmHg.  IAS/Shunts: The interatrial septum was not well visualized.   LEFT VENTRICLE PLAX 2D LVIDd:         5.00 cm   Diastology LVIDs:         3.60 cm   LV e' medial:    5.17 cm/s LV PW:         1.10 cm   LV E/e' medial:  19.6 LV IVS:        1.20 cm   LV e' lateral:   4.12 cm/s LVOT diam:     2.10 cm   LV E/e' lateral: 24.6 LV SV:         79 LV SV Index:   41        2D Longitudinal Strain LVOT Area:     3.46 cm  2D Strain GLS Avg:     -  15.0 %   RIGHT VENTRICLE            IVC RV Basal diam:  3.00 cm    IVC diam: 1.20 cm RV S prime:     6.75 cm/s TAPSE (M-mode): 2.0 cm  LEFT ATRIUM             Index        RIGHT ATRIUM           Index LA diam:        4.00 cm 2.08 cm/m   RA Area:     14.70 cm LA Vol (A2C):   53.6 ml 27.90 ml/m  RA Volume:   37.80 ml  19.68 ml/m LA Vol (A4C):   36.0 ml 18.74 ml/m LA Biplane Vol: 47.0 ml 24.47 ml/m AORTIC VALVE AV Area (Vmax):    1.04 cm AV Area (Vmean):   1.12 cm AV Area (VTI):     1.20 cm AV Vmax:           275.00 cm/s AV Vmean:          179.750 cm/s AV VTI:            0.653 m AV Peak Grad:      30.2 mmHg AV Mean Grad:      17.0 mmHg LVOT Vmax:         82.55 cm/s LVOT Vmean:        57.900 cm/s LVOT VTI:          0.227 m LVOT/AV VTI ratio: 0.35 AI PHT:            880 msec  AORTA Ao Root diam: 2.90 cm Ao Asc diam:  3.40 cm Ao Desc diam: 2.20 cm  MV E velocity: 101.25 cm/s  TRICUSPID VALVE MV A velocity: 103.00 cm/s  TR Peak grad:   11.2 mmHg MV E/A  ratio:  0.98         TR Vmax:        167.00 cm/s  SHUNTS Systemic VTI:  0.23 m Systemic Diam: 2.10 cm  Sreedhar reddy Madireddy Electronically signed by Alean reddy Madireddy Signature Date/Time: 05/22/2023/11:13:00 AM    Final    MONITORS  LONG TERM MONITOR (3-14 DAYS) 05/28/2023  Narrative Patch Wear Time:  12 days and 16 hours (2025-03-20T16:30:30-0400 to 2025-04-02T09:00:12-398)  Patient had a min HR of 50 bpm, max HR of 167 bpm, and avg HR of 69 bpm. Predominant underlying rhythm was Sinus Rhythm. First Degree AV Block was present. 6 Ventricular Tachycardia runs occurred, the run with the fastest interval lasting 4 beats with a max rate of 167 bpm, the longest lasting 10 beats with an avg rate of 122 bpm. 10 Supraventricular Tachycardia runs occurred, the run with the fastest interval lasting 10 beats with a max rate of 146 bpm, the longest lasting 17 beats with an avg rate of 104 bpm. Isolated SVEs were rare (<1.0%), SVE Couplets were rare (<1.0%), and SVE Triplets were rare (<1.0%). Isolated VEs were occasional (1.4%, 16644), VE Couplets were rare (<1.0%, 632), and VE Triplets were rare (<1.0%, 10). Ventricular Bigeminy and Trigeminy were present.  Summary conclusions: 6 episode of ventricular tachycardia longest episode 10 beats at rate of 122. 10 episode of supraventricular tachycardia with the longest episode 17 beats at an average heart rate 104.       ______________________________________________________________________________________________       EKG:  EKG is not ordered today.    Recent Labs: 09/08/2023: Hemoglobin 12.7;  Platelets 209 09/15/2023: BUN 15; Creatinine, Ser 0.93; NT-Pro BNP 252; Potassium 3.6; Sodium 144  Recent Lipid Panel    Component Value Date/Time   CHOL 127 07/30/2022 0941   TRIG 49 07/30/2022 0941   HDL 53 07/30/2022 0941   CHOLHDL 2.4 07/30/2022 0941   LDLCALC 63 07/30/2022 0941   LDLDIRECT 89 10/29/2020 1038     Risk  Assessment/Calculations:                Physical Exam:    VS:  BP 120/80   Pulse 77   Ht 5' 4 (1.626 m)   Wt 188 lb (85.3 kg)   SpO2 98%   BMI 32.27 kg/m     Wt Readings from Last 3 Encounters:  09/15/23 188 lb (85.3 kg)  08/28/23 192 lb 6.4 oz (87.3 kg)  07/31/23 196 lb 1.6 oz (89 kg)     GEN:  Well nourished, well developed in no acute distress HEENT: Normal NECK: No JVD; No carotid bruits LYMPHATICS: No lymphadenopathy CARDIAC: RRR, 2/6 systolic murmur RESPIRATORY:  Clear to auscultation without rales, wheezing or rhonchi  ABDOMEN: Soft, non-tender, non-distended MUSCULOSKELETAL:  No edema; No deformity  SKIN: Warm and dry NEUROLOGIC:  Alert and oriented x 3 PSYCHIATRIC:  Normal affect   ASSESSMENT:    1. Coronary artery disease involving native coronary artery of native heart without angina pectoris   2. Nonrheumatic aortic valve stenosis   3. Palpitations   4. Ventricular tachycardia (HCC)   5. DOE (dyspnea on exertion)   6. Heart failure with preserved ejection fraction, unspecified HF chronicity (HCC)      PLAN:    In order of problems listed above:  CAD -most recent stress test in 2021 was unrevealing for ischemia >> coronary CTA 2024  calcium  score of 215 revealing mild nonobstructive CAD. Stable with no anginal symptoms. No indication for ischemic evaluation.  Continue aspirin 81 mg daily, continue metoprolol  25 mg daily, continue atorvastatin  80 mg daily, Zetia  10 mg daily.   Ventricular tachycardia/palpitations -currently quiescent, continue metoprolol  25 mg daily.  HFpEF with DOE-currently on Lasix , she feels her symptoms are somewhat improved, NYHA class II and she appears to be euvolemic.  Will repeat CMET and proBNP.  Carotid artery stenosis -most recent carotid duplex on 04/16/2023 mild bilateral carotid artery stenosis, followed by [facility].  HLD-most recent LDL was 72 on 06/30/2023, continue Lipitor 80 mg daily, continue Zetia  10 mg daily.     Aortic stenosis-mild to moderate per echo in April of this year.   Disposition-BMET, proBNP, follow-up in 3 months with Dr. Krasowski   Medication Adjustments/Labs and Tests Ordered: Current medicines are reviewed at length with the patient today.  Concerns regarding medicines are outlined above.  Orders Placed This Encounter  Procedures   Basic Metabolic Panel (BMET)   Pro b natriuretic peptide (BNP)   No orders of the defined types were placed in this encounter.   Patient Instructions  Medication Instructions:  Your physician recommends that you continue on your current medications as directed. Please refer to the Current Medication list given to you today.  *If you need a refill on your cardiac medications before your next appointment, please call your pharmacy*  Lab Work: Your physician recommends that you return for lab work in:   Labs today: BMP, Pro BNP  If you have labs (blood work) drawn today and your tests are completely normal, you will receive your results only by: MyChart Message (if you have MyChart)  OR A paper copy in the mail If you have any lab test that is abnormal or we need to change your treatment, we will call you to review the results.  Testing/Procedures: None  Follow-Up: At [facility], you and your health needs are our priority.  As part of our continuing mission to provide you with exceptional heart care, our providers are all part of one team.  This team includes your primary Cardiologist (physician) and Advanced Practice Providers or APPs (Physician Assistants and Nurse Practitioners) who all work together to provide you with the care you need, when you need it.  Your next appointment:   3 month(s)  Provider:   Lamar Fitch, MD    We recommend signing up for the patient portal called MyChart.  Sign up information is provided on this After Visit Summary.  MyChart is used to connect with patients for Virtual Visits  (Telemedicine).  Patients are able to view lab/test results, encounter notes, upcoming appointments, etc.  Non-urgent messages can be sent to your provider as well.   To learn more about what you can do with MyChart, go to [email or web address].   Other Instructions None       Signed, Delon JAYSON Hoover, NP  09/16/2023 2:03 PM    [facility]

## 2023-09-15 ENCOUNTER — Ambulatory Visit: Attending: Cardiology | Admitting: Cardiology

## 2023-09-15 ENCOUNTER — Encounter: Payer: Self-pay | Admitting: Cardiology

## 2023-09-15 VITALS — BP 120/80 | HR 77 | Ht 64.0 in | Wt 188.0 lb

## 2023-09-15 DIAGNOSIS — R002 Palpitations: Secondary | ICD-10-CM | POA: Diagnosis present

## 2023-09-15 DIAGNOSIS — I2510 Atherosclerotic heart disease of native coronary artery without angina pectoris: Secondary | ICD-10-CM

## 2023-09-15 DIAGNOSIS — R0609 Other forms of dyspnea: Secondary | ICD-10-CM

## 2023-09-15 DIAGNOSIS — I472 Ventricular tachycardia, unspecified: Secondary | ICD-10-CM | POA: Diagnosis present

## 2023-09-15 DIAGNOSIS — I5030 Unspecified diastolic (congestive) heart failure: Secondary | ICD-10-CM

## 2023-09-15 DIAGNOSIS — I350 Nonrheumatic aortic (valve) stenosis: Secondary | ICD-10-CM | POA: Diagnosis present

## 2023-09-15 NOTE — Patient Instructions (Signed)
 Medication Instructions:  Your physician recommends that you continue on your current medications as directed. Please refer to the Current Medication list given to you today.  *If you need a refill on your cardiac medications before your next appointment, please call your pharmacy*  Lab Work: Your physician recommends that you return for lab work in:   Labs today: BMP, Pro BNP  If you have labs (blood work) drawn today and your tests are completely normal, you will receive your results only by: MyChart Message (if you have MyChart) OR A paper copy in the mail If you have any lab test that is abnormal or we need to change your treatment, we will call you to review the results.  Testing/Procedures: None  Follow-Up: At [facility], you and your health needs are our priority.  As part of our continuing mission to provide you with exceptional heart care, our providers are all part of one team.  This team includes your primary Cardiologist (physician) and Advanced Practice Providers or APPs (Physician Assistants and Nurse Practitioners) who all work together to provide you with the care you need, when you need it.  Your next appointment:   3 month(s)  Provider:   Lamar Fitch, MD    We recommend signing up for the patient portal called MyChart.  Sign up information is provided on this After Visit Summary.  MyChart is used to connect with patients for Virtual Visits (Telemedicine).  Patients are able to view lab/test results, encounter notes, upcoming appointments, etc.  Non-urgent messages can be sent to your provider as well.   To learn more about what you can do with MyChart, go to [email or web address].   Other Instructions None

## 2023-09-16 ENCOUNTER — Other Ambulatory Visit: Payer: Self-pay

## 2023-09-16 ENCOUNTER — Ambulatory Visit: Admitting: Neurology

## 2023-09-16 ENCOUNTER — Ambulatory Visit: Payer: Self-pay | Admitting: Cardiology

## 2023-09-16 DIAGNOSIS — R5383 Other fatigue: Secondary | ICD-10-CM | POA: Diagnosis not present

## 2023-09-16 DIAGNOSIS — G4733 Obstructive sleep apnea (adult) (pediatric): Secondary | ICD-10-CM | POA: Diagnosis not present

## 2023-09-16 DIAGNOSIS — J4520 Mild intermittent asthma, uncomplicated: Secondary | ICD-10-CM | POA: Diagnosis not present

## 2023-09-16 LAB — BASIC METABOLIC PANEL WITH GFR
BUN/Creatinine Ratio: 16 (ref 12–28)
BUN: 15 mg/dL (ref 8–27)
CO2: 24 mmol/L (ref 20–29)
Calcium: 9.3 mg/dL (ref 8.7–10.3)
Chloride: 104 mmol/L (ref 96–106)
Creatinine, Ser: 0.93 mg/dL (ref 0.57–1.00)
Glucose: 117 mg/dL — ABNORMAL HIGH (ref 70–99)
Potassium: 3.6 mmol/L (ref 3.5–5.2)
Sodium: 144 mmol/L (ref 134–144)
eGFR: 65 mL/min/1.73

## 2023-09-16 LAB — PRO B NATRIURETIC PEPTIDE: NT-Pro BNP: 252 pg/mL (ref 0–301)

## 2023-09-16 MED ORDER — POTASSIUM CHLORIDE CRYS ER 20 MEQ PO TBCR: EXTENDED_RELEASE_TABLET | ORAL | 3 refills | Status: AC

## 2023-09-21 DIAGNOSIS — L820 Inflamed seborrheic keratosis: Secondary | ICD-10-CM | POA: Diagnosis not present

## 2023-09-21 DIAGNOSIS — C44311 Basal cell carcinoma of skin of nose: Secondary | ICD-10-CM | POA: Diagnosis not present

## 2023-09-21 DIAGNOSIS — L578 Other skin changes due to chronic exposure to nonionizing radiation: Secondary | ICD-10-CM | POA: Diagnosis not present

## 2023-09-21 DIAGNOSIS — L821 Other seborrheic keratosis: Secondary | ICD-10-CM | POA: Diagnosis not present

## 2023-09-22 DIAGNOSIS — M48062 Spinal stenosis, lumbar region with neurogenic claudication: Secondary | ICD-10-CM | POA: Diagnosis not present

## 2023-09-22 DIAGNOSIS — M545 Low back pain, unspecified: Secondary | ICD-10-CM | POA: Diagnosis not present

## 2023-09-22 DIAGNOSIS — M4727 Other spondylosis with radiculopathy, lumbosacral region: Secondary | ICD-10-CM | POA: Diagnosis not present

## 2023-09-22 DIAGNOSIS — G8929 Other chronic pain: Secondary | ICD-10-CM | POA: Diagnosis not present

## 2023-09-24 DIAGNOSIS — H0011 Chalazion right upper eyelid: Secondary | ICD-10-CM | POA: Diagnosis not present

## 2023-09-24 DIAGNOSIS — H25813 Combined forms of age-related cataract, bilateral: Secondary | ICD-10-CM | POA: Diagnosis not present

## 2023-10-01 DIAGNOSIS — E0590 Thyrotoxicosis, unspecified without thyrotoxic crisis or storm: Secondary | ICD-10-CM | POA: Diagnosis not present

## 2023-10-01 DIAGNOSIS — I5032 Chronic diastolic (congestive) heart failure: Secondary | ICD-10-CM | POA: Diagnosis not present

## 2023-10-01 DIAGNOSIS — M19071 Primary osteoarthritis, right ankle and foot: Secondary | ICD-10-CM | POA: Diagnosis not present

## 2023-10-01 DIAGNOSIS — J4540 Moderate persistent asthma, uncomplicated: Secondary | ICD-10-CM | POA: Diagnosis not present

## 2023-10-01 DIAGNOSIS — R600 Localized edema: Secondary | ICD-10-CM | POA: Diagnosis not present

## 2023-10-01 DIAGNOSIS — M19072 Primary osteoarthritis, left ankle and foot: Secondary | ICD-10-CM | POA: Diagnosis not present

## 2023-10-01 DIAGNOSIS — E559 Vitamin D deficiency, unspecified: Secondary | ICD-10-CM | POA: Diagnosis not present

## 2023-10-01 DIAGNOSIS — R413 Other amnesia: Secondary | ICD-10-CM | POA: Diagnosis not present

## 2023-10-01 DIAGNOSIS — N3281 Overactive bladder: Secondary | ICD-10-CM | POA: Diagnosis not present

## 2023-10-01 DIAGNOSIS — N390 Urinary tract infection, site not specified: Secondary | ICD-10-CM | POA: Diagnosis not present

## 2023-10-01 DIAGNOSIS — Z86718 Personal history of other venous thrombosis and embolism: Secondary | ICD-10-CM | POA: Diagnosis not present

## 2023-10-01 DIAGNOSIS — M160 Bilateral primary osteoarthritis of hip: Secondary | ICD-10-CM | POA: Diagnosis not present

## 2023-10-01 DIAGNOSIS — E785 Hyperlipidemia, unspecified: Secondary | ICD-10-CM | POA: Diagnosis not present

## 2023-10-08 ENCOUNTER — Ambulatory Visit: Payer: Medicare Other | Admitting: Neurology

## 2023-10-16 DIAGNOSIS — N179 Acute kidney failure, unspecified: Secondary | ICD-10-CM | POA: Diagnosis not present

## 2023-10-27 ENCOUNTER — Telehealth: Payer: Self-pay | Admitting: Cardiology

## 2023-10-27 NOTE — Telephone Encounter (Signed)
 Pt c/o medication issue:  1. Name of Medication: potassium chloride  SA (KLOR-CON  M) 20 MEQ tablet   2. How are you currently taking this medication (dosage and times per day)? As written   3. Are you having a reaction (difficulty breathing--STAT)? No  4. What is your medication issue? Pt would like to know if she should still continue taking this  medication.

## 2023-10-29 ENCOUNTER — Encounter: Payer: Self-pay | Admitting: Cardiology

## 2023-10-29 ENCOUNTER — Ambulatory Visit: Attending: Cardiology | Admitting: Cardiology

## 2023-10-29 VITALS — BP 120/80 | HR 64 | Ht 64.0 in | Wt 195.0 lb

## 2023-10-29 DIAGNOSIS — I5032 Chronic diastolic (congestive) heart failure: Secondary | ICD-10-CM | POA: Insufficient documentation

## 2023-10-29 DIAGNOSIS — I340 Nonrheumatic mitral (valve) insufficiency: Secondary | ICD-10-CM | POA: Insufficient documentation

## 2023-10-29 DIAGNOSIS — I6522 Occlusion and stenosis of left carotid artery: Secondary | ICD-10-CM | POA: Diagnosis not present

## 2023-10-29 DIAGNOSIS — R0609 Other forms of dyspnea: Secondary | ICD-10-CM | POA: Insufficient documentation

## 2023-10-29 DIAGNOSIS — Z86718 Personal history of other venous thrombosis and embolism: Secondary | ICD-10-CM | POA: Insufficient documentation

## 2023-10-29 MED ORDER — FUROSEMIDE 40 MG PO TABS: 40.0000 mg | ORAL_TABLET | Freq: Every day | ORAL | 3 refills | Status: DC

## 2023-10-29 NOTE — Patient Instructions (Signed)
 Medication Instructions:   STOP: Extra Dose of Lasix - Lasix  40mg  3 times a week   Lab Work: None Ordered If you have labs (blood work) drawn today and your tests are completely normal, you will receive your results only by: [employer] (if you have MyChart) OR A paper copy in the mail If you have any lab test that is abnormal or we need to change your treatment, we will call you to review the results.   Testing/Procedures: MARCH 2026 Your physician has requested that you have an echocardiogram. Echocardiography is a painless test that uses sound waves to create images of your heart. It provides your doctor with information about the size and shape of your heart and how well your heart's chambers and valves are working. This procedure takes approximately one hour. There are no restrictions for this procedure. Please do NOT wear cologne, perfume, aftershave, or lotions (deodorant is allowed). Please arrive 15 minutes prior to your appointment time.  Please note: We ask at that you not bring children with you during ultrasound (echo/ vascular) testing. Due to room size and safety concerns, children are not allowed in the ultrasound rooms during exams. Our front office staff cannot provide observation of children in our lobby area while testing is being conducted. An adult accompanying a patient to their appointment will only be allowed in the ultrasound room at the discretion of the ultrasound technician under special circumstances. We apologize for any inconvenience.    Follow-Up: At [facility], you and your health needs are our priority.  As part of our continuing mission to provide you with exceptional heart care, we have created designated Provider Care Teams.  These Care Teams include your primary Cardiologist (physician) and Advanced Practice Providers (APPs -  Physician Assistants and Nurse Practitioners) who all work together to provide you with the care you need, when you need  it.  We recommend signing up for the patient portal called MyChart.  Sign up information is provided on this After Visit Summary.  MyChart is used to connect with patients for Virtual Visits (Telemedicine).  Patients are able to view lab/test results, encounter notes, upcoming appointments, etc.  Non-urgent messages can be sent to your provider as well.   To learn more about what you can do with MyChart, go to [email or web address].    Your next appointment:   6 month(s) after Echo  The format for your next appointment:   In Person  Provider:   Lamar Fitch, MD    Other Instructions NA

## 2023-10-29 NOTE — Progress Notes (Signed)
 Cardiology Office Note:    Date:  10/29/2023   ID:  Karen Shannon, DOB Sep 21, 1950, MRN [id number]  PCP:  Karen Vicenta BRAVO, MD  Cardiologist:  Lamar Fitch, MD    Referring MD: Karen Vicenta BRAVO, MD   Chief Complaint  Patient presents with   Follow-up    History of Present Illness:    Karen Shannon is a 73 y.o. female with past medical history significant for coronary artery disease which was nonobstructive by 0 coronary scans from 2024, DVT but she does not want to take Eliquis because she cannot afford it she is on aspirin, carotic arterial disease follow-up vascular surgeons, mitral valve prolapse, TIA, ventricular tachycardia, moderate aortic stenosis, obstructive sleep apnea on CPAP mask COPD breast cancer in 2012.  Comes today to months for follow-up overall doing well denies any chest pain tightness squeezing pressure chest.  She is worried about her kidney she thinks she may take too much of diuretics.  Past Medical History:  Diagnosis Date   Acute deep vein thrombosis (DVT) of left peroneal vein 04/11/2019   Aortic stenosis 04/12/2021   Basal cell carcinoma 07/25/2021   Basal cell carcinoma (BCC) in situ of skin 02/14/2021   Had several places of basal cell carcinoma removed on top of head over the last 2 to 3 years.     Bunion of great toe of right foot 02/15/1997   Bunion removed from right big toe by Dr Rollie, [place] in 1999.     Cervical spondylosis 03/18/2018   CHF (congestive heart failure) (HCC) 08/28/2023   Chronic bilateral low back pain without sciatica 03/18/2018   Chronic foot pain, left 03/14/2019   Possible fracture base 2nd metatarsal   Chronic neck pain 03/18/2018   COPD (chronic obstructive pulmonary disease)    COVID-19 10/13/2022   I had covid from 10-13-2022 until 10-30-2022.     DDD (degenerative disc disease), cervical 03/18/2018   Deviated nasal septum 02/15/1997   Surgery at [facility] for deviated nasal  Septum.     DVT femoral (deep venous thrombosis) with thrombophlebitis, left (HCC) 04/11/2019   Dyspnea on exertion 01/27/2018   Facet arthritis, degenerative, lumbar spine 03/18/2018   Family history of malignant neoplasm of breast    Generalized anxiety disorder 01/24/2019   Possible history of panic attacks   Genetic testing 11/14/2020   Negative genetic testing on the BreastNext panel.  PALB2 p.L100F VUS.  The BreastNext gene panel offered by [employer] includes sequencing and rearrangement analysis for the following 17 genes:  ATM, BARD1, BRCA1, BRCA2, BRIP1, CDH1, CHEK2, MRE11A, MUTYH, NBN, NF1, PALB2, PTEN, RAD50, RAD51C, RAD51D, and TP53.   The report date was 11/18/2013.     UPDATE: PALB2 p.L100F has been reclassified to Likely B   Greater trochanteric bursitis of both hips 04/10/2018   Heart murmur 01/27/2018   History of breast cancer 03/18/2018   Formatting of this note might be different from the original. S/p bilateral mastectomy     History of deep vein thrombosis (DVT) of lower extremity 10/02/2019   Hypertrophic and atrophic condition of skin 07/03/2021   Left carotid artery stenosis 07/03/2022   Left leg swelling 02/23/2019   Lumbar spondylosis 03/18/2018   Malignant neoplasm of central portion of left female breast    S/p bilateral mastectomy   Malignant neoplasm of female breast (HCC) 02/10/2009   Genetic testing neg; triple neg; S/P bilat mastectomy     Mild cognitive impairment of uncertain or  unknown etiology 03/20/2022   Mild mitral regurgitation 01/27/2018   Mitral valve prolapse 01/27/2018   Obstructive sleep apnea    Variable CPAP use.   Osteopenia after menopause 12/21/2020   Other idiopathic scoliosis, lumbar region 03/18/2018   Pain of left calf 03/14/2019   Palpitations 01/25/2018   Peripheral vascular disease, unspecified 10/29/2020   up to 69% stenosis on the lef internal carotic artery based on cardiac ultrasounds from June 2022   Personal history of  colon polyps, unspecified 03/16/2023   Pes planus of left foot 02/23/2019   Pinched nerve 02/10/2021   Ptosis of both upper eyelids 08/27/2022   Had medical necessary surgery for droopy eyelids as a result of loss of peripheral vision in both eyes.     Rib pain on right side 01/21/2023   Right lumbar radiculopathy 03/19/2021   Stasis dermatitis, acute 10/03/2019   Tenosynovitis of left ankle 04/11/2019   TIA (transient ischemic attack) 01/17/2019   Tongue adhesions, congenital 12/04/2021   Dr. HONOR had suspicions that I may tongue cancer. I had biopsy of tongue. Was only a cyst. No cancer.     Triple negative breast cancer (HCC) 06/09/2023   03-31-2010  I had Triple Negative Breast Cancer. I had bilateral mastectomy performed by Dr Bert in [place], [place].     Urge incontinence 07/28/2019   Varicose veins of left lower extremity    Ventricular tachycardia 02/28/2019    Past Surgical History:  Procedure Laterality Date   basal cell removed     Head   BUNIONECTOMY Right 1999   eye lid lift Bilateral    done July 2024   MASTECTOMY Bilateral 2012   NASAL SEPTUM SURGERY  1996   SKIN CANCER EXCISION  2018   Top of head    SKIN CANCER EXCISION  07/2019   Nose   TENDON TRANSFER Right 1999   foot   TONGUE BIOPSY  12/04/2021   Dr HONOR    Current Medications: Current Meds  Medication Sig   ascorbic acid (VITAMIN C) 500 MG tablet Take 500 mg by mouth daily.   aspirin 81 MG chewable tablet Chew 81 mg by mouth daily.   atorvastatin  (LIPITOR) 80 MG tablet Take 1 tablet by mouth once daily   Biotin 800 MCG TABS daily at 6 (six) AM.   calcium  carbonate (SUPER CALCIUM ) 1500 (600 Ca) MG TABS tablet Take 1 tablet by mouth daily.   Cholecalciferol 25 MCG (1000 UT) capsule Take 1,000 Units by mouth daily.   dicyclomine (BENTYL) 10 MG capsule Take 10 mg by mouth as needed for spasms (IBS).   diphenoxylate-atropine (LOMOTIL) 2.5-0.025 MG tablet Take 1 tablet by mouth as needed for diarrhea or  loose stools.   donepezil (ARICEPT) 10 MG tablet Take 10 mg by mouth at bedtime.   dorzolamide (TRUSOPT) 2 % ophthalmic solution Place 1 drop into the right eye 2 (two) times daily.   ergocalciferol  (VITAMIN D2) 50000 units capsule Take 50,000 Units by mouth once a week.   ezetimibe  (ZETIA ) 10 MG tablet Take 1 tablet by mouth once daily   furosemide  (LASIX ) 40 MG tablet Take 1 tablet (40 mg total) by mouth daily. Take an additional 40 mg 3 times per week in the afternoon   HYDROcodone-acetaminophen (NORCO) 7.5-325 MG tablet Take 1 tablet by mouth 3 (three) times daily.   loratadine (CLARITIN) 10 MG tablet Take 10 mg by mouth daily as needed for allergies.   memantine (NAMENDA) 5 MG tablet Take 5  mg by mouth 2 (two) times daily.   metoprolol  succinate (TOPROL  XL) 25 MG 24 hr tablet Take 1 tablet (25 mg total) by mouth daily.   montelukast (SINGULAIR) 10 MG tablet Take 10 mg by mouth at bedtime.   Multiple Vitamins-Minerals (PRESERVISION AREDS 2+MULTI VIT PO) Take 1 tablet by mouth daily.   neomycin-polymyxin b-dexamethasone  (MAXITROL) 3.5-10000-0.1 OINT Place 1 Application into both eyes 3 (three) times daily.   nitrofurantoin, macrocrystal-monohydrate, (MACROBID) 100 MG capsule Take 100 mg by mouth 2 (two) times daily.   ondansetron  (ZOFRAN ) 4 MG tablet Take 4 mg by mouth every 8 (eight) hours as needed for nausea or vomiting.   potassium chloride  SA (KLOR-CON  M) 20 MEQ tablet Please take this medication on the days that you take an extra dose of Lasix .   senna (SENOKOT) 8.6 MG tablet Take 1 tablet by mouth daily.   solifenacin (VESICARE) 10 MG tablet Take 10 mg by mouth daily.   tamsulosin (FLOMAX) 0.4 MG CAPS capsule Take 0.4 mg by mouth daily after supper.   tiZANidine (ZANAFLEX) 4 MG tablet Take 4 mg by mouth every 8 (eight) hours as needed for muscle spasms.   Triamcinolone  Acetonide (KENALOG -80 IJ) Inject 1 Dose as directed once.   vitamin B-12 (CYANOCOBALAMIN) 1000 MCG tablet Take 1,000  mcg by mouth daily.   Zinc Sulfate 66 MG TABS Take 66 mg by mouth daily.     Allergies:   Duloxetine, Clarithromycin, Prednisone, Sertraline, Duloxetine hcl, and Sertraline hcl   Social History   Socioeconomic History   Marital status: Single    Spouse name: Not on file   Number of children: 0   Years of education: 16   Highest education level: Bachelor's degree (e.g., BA, AB, BS)  Occupational History   Occupation: Retired  Tobacco Use   Smoking status: Never   Smokeless tobacco: Never  Vaping Use   Vaping status: Never Used  Substance and Sexual Activity   Alcohol use: Not Currently   Drug use: Never   Sexual activity: Not Currently  Other Topics Concern   Not on file  Social History Narrative   Pt is single she lives alone has no children   Right handed   Social Drivers of Corporate investment banker Strain: Low Risk  (06/22/2023)   Received from [facility]   Overall Financial Resource Strain (CARDIA)    Difficulty of Paying Living Expenses: Not hard at all  Food Insecurity: No Food Insecurity (06/22/2023)   [facility] from [facility]   Hunger Vital Sign    Within the past 12 months, you worried that your food would run out before you got the money to buy more.: Never true    Within the past 12 months, the food you bought just didn't last and you didn't have money to get more.: Never true  Transportation Needs: No Transportation Needs (06/22/2023)   Received from [facility] - Transportation    Lack of Transportation (Medical): No    Lack of Transportation (Non-Medical): No  Physical Activity: Not on file  Stress: Not on file  Social Connections: Not on file     Family History: [name] history includes Breast cancer in her maternal aunt; Breast cancer (age of onset: 39) in an other family member; Emphysema in her father; Liver cancer in her maternal uncle; Lung cancer in her maternal uncle; Lymphoma (age of onset: 63) in her mother;  Prostate cancer in an other family member; Stomach  cancer in her paternal uncle. ROS:   Please see the history of present illness.    All 14 point review of systems negative except as described per history of present illness  EKGs/Labs/Other Studies Reviewed:         Recent Labs: 09/08/2023: Hemoglobin 12.7; Platelets 209 09/15/2023: BUN 15; Creatinine, Ser 0.93; NT-Pro BNP 252; Potassium 3.6; Sodium 144  Recent Lipid Panel    Component Value Date/Time   CHOL 127 07/30/2022 0941   TRIG 49 07/30/2022 0941   HDL 53 07/30/2022 0941   CHOLHDL 2.4 07/30/2022 0941   LDLCALC 63 07/30/2022 0941   LDLDIRECT 89 10/29/2020 1038    Physical Exam:    VS:  BP 120/80   Pulse 64   Ht 5' 4 (1.626 m)   Wt 195 lb (88.5 kg)   SpO2 96%   BMI 33.47 kg/m     Wt Readings from Last 3 Encounters:  10/29/23 195 lb (88.5 kg)  09/15/23 188 lb (85.3 kg)  08/28/23 192 lb 6.4 oz (87.3 kg)     GEN:  Well nourished, well developed in no acute distress HEENT: Normal NECK: No JVD; No carotid bruits LYMPHATICS: No lymphadenopathy CARDIAC: RRR, no murmurs, no rubs, no gallops RESPIRATORY:  Clear to auscultation without rales, wheezing or rhonchi  ABDOMEN: Soft, non-tender, non-distended MUSCULOSKELETAL:  No edema; No deformity  SKIN: Warm and dry LOWER EXTREMITIES: no swelling NEUROLOGIC:  Alert and oriented x 3 PSYCHIATRIC:  Normal affect   ASSESSMENT:    1. Chronic diastolic congestive heart failure (HCC)   2. Left carotid artery stenosis   3. Mild mitral regurgitation   4. History of deep vein thrombosis (DVT) of lower extremity    PLAN:    In order of problems listed above:  Chronic diastolic congestive heart failure.  She is compensated she wants to reduce dose of diuretics which we will do however I warned her and asked her to check her weight every single day in the morning if she can increase her weight by 2 pounds in 2 or 3 consecutive days she is to take extra 40 mg of  Lasix . Carotic artery stenosis, to be followed by vascular surgeons. Mild mitral valve regurgitation status stable hemodynamically continue. History of DVT, does not want to take Eliquis as she simply cannot afford it. Dyslipidemia she takes Lipitor 80 and Zetia , I did review K PN which show me her LDL 79 HDL 45 we will continue present management   Medication Adjustments/Labs and Tests Ordered: Current medicines are reviewed at length with the patient today.  Concerns regarding medicines are outlined above.  No orders of the defined types were placed in this encounter.  Medication changes: No orders of the defined types were placed in this encounter.   Signed, Lamar DOROTHA Fitch, MD, [facility] 10/29/2023 3:33 PM    [facility]

## 2023-10-29 NOTE — Addendum Note (Signed)
 Addended by: ARLOA PLANAS D on: 10/29/2023 03:49 PM   Modules accepted: Orders

## 2023-10-29 NOTE — Telephone Encounter (Signed)
 Called the patient and informed her of Dr. Karry recommendation below:  Yes, she needs to be taking potassium  Patient verbalized understanding and had no further questions at this time.

## 2023-11-16 DIAGNOSIS — R399 Unspecified symptoms and signs involving the genitourinary system: Secondary | ICD-10-CM | POA: Diagnosis not present

## 2023-11-20 DIAGNOSIS — N390 Urinary tract infection, site not specified: Secondary | ICD-10-CM | POA: Diagnosis not present

## 2023-11-20 DIAGNOSIS — Z79899 Other long term (current) drug therapy: Secondary | ICD-10-CM | POA: Diagnosis not present

## 2023-11-21 DIAGNOSIS — Z79899 Other long term (current) drug therapy: Secondary | ICD-10-CM | POA: Diagnosis not present

## 2023-11-21 DIAGNOSIS — N390 Urinary tract infection, site not specified: Secondary | ICD-10-CM | POA: Diagnosis not present

## 2023-11-22 DIAGNOSIS — Z79899 Other long term (current) drug therapy: Secondary | ICD-10-CM | POA: Diagnosis not present

## 2023-11-22 DIAGNOSIS — N390 Urinary tract infection, site not specified: Secondary | ICD-10-CM | POA: Diagnosis not present

## 2023-11-23 DIAGNOSIS — J4520 Mild intermittent asthma, uncomplicated: Secondary | ICD-10-CM | POA: Diagnosis not present

## 2023-11-23 DIAGNOSIS — Z79899 Other long term (current) drug therapy: Secondary | ICD-10-CM | POA: Diagnosis not present

## 2023-11-23 DIAGNOSIS — G4733 Obstructive sleep apnea (adult) (pediatric): Secondary | ICD-10-CM | POA: Diagnosis not present

## 2023-11-23 DIAGNOSIS — R5383 Other fatigue: Secondary | ICD-10-CM | POA: Diagnosis not present

## 2023-11-23 DIAGNOSIS — N390 Urinary tract infection, site not specified: Secondary | ICD-10-CM | POA: Diagnosis not present

## 2023-11-24 DIAGNOSIS — Z79899 Other long term (current) drug therapy: Secondary | ICD-10-CM | POA: Diagnosis not present

## 2023-11-24 DIAGNOSIS — N390 Urinary tract infection, site not specified: Secondary | ICD-10-CM | POA: Diagnosis not present

## 2023-11-25 DIAGNOSIS — N390 Urinary tract infection, site not specified: Secondary | ICD-10-CM | POA: Diagnosis not present

## 2023-11-25 DIAGNOSIS — Z79899 Other long term (current) drug therapy: Secondary | ICD-10-CM | POA: Diagnosis not present

## 2023-11-26 DIAGNOSIS — N390 Urinary tract infection, site not specified: Secondary | ICD-10-CM | POA: Diagnosis not present

## 2023-11-26 DIAGNOSIS — Z79899 Other long term (current) drug therapy: Secondary | ICD-10-CM | POA: Diagnosis not present

## 2023-11-30 DIAGNOSIS — E0590 Thyrotoxicosis, unspecified without thyrotoxic crisis or storm: Secondary | ICD-10-CM | POA: Diagnosis not present

## 2023-12-01 DIAGNOSIS — N3090 Cystitis, unspecified without hematuria: Secondary | ICD-10-CM | POA: Diagnosis not present

## 2023-12-07 ENCOUNTER — Encounter: Payer: Self-pay | Admitting: Psychology

## 2023-12-07 ENCOUNTER — Ambulatory Visit (INDEPENDENT_AMBULATORY_CARE_PROVIDER_SITE_OTHER): Admitting: Psychology

## 2023-12-07 ENCOUNTER — Ambulatory Visit

## 2023-12-07 DIAGNOSIS — F0670 Mild neurocognitive disorder due to known physiological condition without behavioral disturbance: Secondary | ICD-10-CM

## 2023-12-07 NOTE — Progress Notes (Signed)
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   Employment: Retired. She previously worked in engineer, site.  Evaluation Results:   Behavioral Observations: Ms. Mackie was unaccompanied, arrived to her appointment on time, and was appropriately dressed and groomed. She appeared alert. Observed gait and station were within normal limits. She did carry a cane for added stability. Gross motor functioning appeared intact upon informal observation and no abnormal movements (e.g., tremors) were noted. Her affect was relaxed and positive. Spontaneous speech was fluent and word finding difficulties were not observed during the clinical interview. Thought processes were coherent, organized, and normal in content. Insight into her cognitive difficulties appeared adequate.   During testing, sustained attention was appropriate. Task engagement was adequate and she persisted when challenged. Overall, Ms. Waldroup was cooperative with the clinical interview and subsequent testing procedures.   Adequacy of Effort: The validity of  neuropsychological testing is limited by the extent to which the individual being tested may be assumed to have exerted adequate effort during testing. Ms. Robello expressed her intention to perform to the best of her abilities and exhibited adequate task engagement and persistence. Scores across stand-alone and embedded performance validity measures were within expectation. As such, the results of the current evaluation are believed to be a valid representation of Ms. Quizon current cognitive functioning.  Test Results: Ms. Vandervort was fully oriented at the time of the current evaluation.  Intellectual abilities based upon educational and vocational attainment were estimated to be in the average range. Premorbid abilities were estimated to be within the average range based upon a single-word reading test.   Processing speed was average to above average. Basic attention was below average. More complex attention (e.g., working memory) was average. Executive functioning was average to well above average.  While not directly assessed, receptive language abilities were believed to be intact. Ms. Diep did not exhibit any difficulties comprehending task instructions and answered all questions asked of her appropriately. Assessed expressive language was somewhat variable. Phonemic fluency was well below average, semantic fluency was well below average, and confrontation naming was above average.      Assessed visuospatial/visuoconstructional abilities were average to well above average.    Learning (i.e., encoding) of novel verbal and visual information  was average. Spontaneous delayed recall (i.e., retrieval) of previously learned information was below average to average. Retention rates were 71% across a list learning task, 50% across a story learning task, and 89% across a shape learning task. Performance across recognition tasks was below average to average, suggesting evidence for information  consolidation.   Results of emotional screening instruments suggested that recent symptoms of generalized anxiety were in the minimal range, while symptoms of depression were within normal limits. A screening instrument assessing recent sleep quality suggested the presence of minimal sleep dysfunction.  Table of Scores:   Note: This summary of test scores accompanies the interpretive report and should not be considered in isolation without reference to the appropriate sections in the text. Descriptors are based on appropriate normative data and may be adjusted based on clinical judgment. Terms such as Within Normal Limits and Outside Normal Limits are used when a more specific description of the test score cannot be determined. Descriptors refer to the current evaluation only.         Percentile - Normative Descriptor > 98 - Exceptionally High 91-97 - Well Above Average 75-90 - Above Average 25-74 - Average 9-24 - Below Average 2-8 - Well Below Average < 2 - Exceptionally Low         Validity: December 2020 February 2024 Current  DESCRIPTOR         DCT: --- --- --- --- Within Normal Limits  CVLT-III FC: --- --- --- --- Within Normal Limits  BVMT-R Retention Percentage: --- --- --- --- Within Normal Limits  D-KEFS Color Word EI: --- --- --- --- Within Normal Limits         Orientation:        Raw Score Raw Score Raw Score Percentile   NAB Orientation, Form 1 29/29 29/29 29/29  --- ---         Intellectual Functioning:        Standard Score Standard Score Standard Score Percentile   Test of Premorbid Functioning: 101 102 96 39 Average         Memory:       Wechsler Memory Scale (WMS-IV):                       Raw Score (Scaled Score) Raw Score (Scaled Score) Raw Score (Scaled Score) Percentile     Logical Memory I 27/53 (8) 27/53 (8) 29/53 (9) 37 Average    Logical Memory II 12/39 (7) 12/39 (8) 9/39 (6) 9 Below Average    Logical Memory Recognition 20/23 21/23 16/23  17-25  Below Average         [place]  Verbal Learning Test (CVLT-III) Brief Form: Raw Score (Scaled/Standard Score) Raw Score (Scaled/Standard Score) Raw Score (Scaled/Standard Score) Percentile     Total Trials 1-4 22/36 (83) 22/36 (86) 25/36 (100) 50 Average    Short-Delay Free Recall 7/9 (9) 6/9 (8) 7/9 (10) 50 Average    Long-Delay Free Recall 7/9 (11) 4/9 (6) 5/9 (7) 16 Below Average    Long-Delay Cued Recall 7/9 (10) 4/9 (4) 5/9 (6) 9 Below Average    Recognition Hits 8/9 (9) 8/9 (10) 9/9 (13) 84 Above Average    False Positive Errors 0 (12) 1 (9) 1 (9) 37 Average         Brief Visuospatial Memory Test (BVMT-R), Form 1: Raw Score (T Score) Raw Score (T Score) Raw Score (T Score) Percentile     Total Trials 1-3 16/36 (40) 10/36 (30)  21/36 (50) 50 Average    Delayed Recall 7/12 (45) 6/12 (41) 8/12 (50) 50 Average    Recognition Discrimination Index 5 6 5  --- Within Normal Limits    Recognition Hits 5/6 6/6 5/6 --- Within Normal Limits    False Positive Errors 0 0 0 --- Within Normal Limits          Attention/Executive Function:       Trail Making Test (TMT): Raw Score (T Score) Raw Score (T Score) Raw Score (T Score) Percentile     Part A 31 secs.,  0 errors (50) 31 secs.,  0 errors (52) 29 secs.,  0 errors (52) 58 Average    Part B 68 secs.,  0 errors (51) 72 secs.,  0 errors (54) 57 secs.,  0 errors (58) 79 Above Average           Scaled Score Scaled Score Scaled Score Percentile   WAIS-IV Coding: 10 13 12  75 Above Average         NAB Attention Module, Form 1: T Score T Score T Score Percentile     Digits Forward 50 45 41 18 Below Average    Digits Backwards 45 50 45 31 Average         D-KEFS Color-Word Interference Test: Raw Score (Scaled Score) Raw Score (Scaled Score) Raw Score (Scaled Score) Percentile     Color Naming 29 secs. (12) 24 secs. (14) 29 secs. (12) 75 Above Average    Word Reading 21 secs. (12) 20 secs. (13) 24 secs. (11) 63 Average    Inhibition  64 secs. (11) 59 secs. (12) 48 secs. (14) 91 Well Above Average      Total Errors 5 errors (6) 3 errors (10) 0 errors (13) 84 Above Average    Inhibition/Switching 70 secs. (11) 65 secs. (12) 85 secs. (9) 37 Average      Total Errors 2 errors (11) 0 errors (13) 1 error (12) 75 Above Average         Language:       Verbal Fluency Test: Raw Score (T Score) Raw Score (T Score) Raw Score (T Score) Percentile     Phonemic Fluency (FAS) 23 (32) 37 (43) 28 (36) 8 Well Below Average    Animal Fluency 19 (48) 13 (33) 13 (33) 5 Well Below Average          NAB Language Module, Form 1: T Score T Score T Score Percentile     Auditory Comprehension 56 56 --- --- ---    Naming 31/31 (56) 31/31 (58) 31/31 (58) 79 Above Average         Visuospatial/Visuoconstruction:        Raw Score Raw Score Raw Score Percentile   Clock Drawing: 8/10 10/10 10/10 --- Within Normal Limits         NAB Spatial Module, Form 1: T Score T Score T Score Percentile     Visual Discrimination --- --- 46 34 Average    Figure Drawing Copy 60 65 65 93 Well Above Average          Scaled Score Scaled Score Scaled Score Percentile   WAIS-IV Matrix Reasoning: 8 8 9  37 Average         Mood and Personality:        Raw Score Raw Score Raw Score Percentile   Geriatric Depression Scale: 12 7 1  --- Within Normal Limits  Geriatric Anxiety Scale: 16 9 4  --- Minimal  Somatic 10 5 3  --- Minimal    Cognitive 5 3 1  --- Minimal    Affective 1 1 0 --- Minimal         Additional Questionnaires:        Raw Score Raw Score Raw Score Percentile   PROMIS Sleep Disturbance Questionnaire: 13 8 8  --- None to Slight   Informed Consent and Coding/Compliance:   The current evaluation represents a clinical evaluation for the purposes previously outlined by the referral source and is in no way reflective of a forensic evaluation.   Ms. Bezdek was provided with a verbal description of the nature and purpose of the present neuropsychological  evaluation. Also reviewed were the foreseeable risks and/or discomforts and benefits of the procedure, limits of confidentiality, and mandatory reporting requirements of this provider. The patient was given the opportunity to ask questions and receive answers about the evaluation. Oral consent to participate was provided by the patient.   This evaluation was conducted by Arthea KYM Maryland, Ph.D., ABPP-CN, board certified clinical neuropsychologist. Ms. Mosteller completed a clinical interview with Dr. Maryland, billed as one unit [phone], and 100 minutes of cognitive testing and scoring, billed as one unit [phone] and two additional units 96137. As a separate and discrete service, one unit [phone] and two units 96133 (156 minutes) were billed for Dr. Loralee time spent in interpretation and report writing.

## 2023-12-11 ENCOUNTER — Telehealth: Payer: Self-pay

## 2023-12-11 NOTE — Telephone Encounter (Signed)
 Pt Karen Shannon on nurse line stating she is having recurrent UTI's. Her urologist has prescribed estradiol cream. She told them she had hx of breast cancer, and they recommended she call you to get approval before picking it up from pharmacy.

## 2023-12-15 NOTE — Progress Notes (Signed)
 [facility] - Clinic Note  12/28/2023     CHIEF COMPLAINT Patient presents for Retina Follow Up   HISTORY OF PRESENT ILLNESS: Karen Shannon is a 73 y.o. female who presents to the clinic today for:   HPI     Retina Follow Up   Patient presents with  Other.  In left eye.  This started 3 years ago.  Severity is moderate.  Duration of 8 months.  Since onset it is stable.  I, the attending physician,  performed the HPI with the patient and updated documentation appropriately.        Comments   Pt states no changes in vision. Pt has FOL if she looks sharply to the side. Pt has been dealing with Dermatitis OU for the past year, Dr Fleeta gave her ointment to use at bedtime. Pt is using Dorzolamide OD BID. Pt uses ATS PRN. Pt denies floaters/pain.      Last edited by Valdemar Rogue, MD on 01/03/2024  3:16 PM.     Patient feels the vision is stable.   Referring physician: Fleeta Zerita DASEN, MD [address] [place],  [place] [address]  HISTORICAL INFORMATION:  Selected notes from the MEDICAL RECORD NUMBER Referred by Dr. Lelon for eval of PVD OU LEE: 10.20.2022 Ocular Hx-PVD OU, cataracts, glc suspect   CURRENT MEDICATIONS: Current Outpatient Medications (Ophthalmic Drugs)  Medication Sig   dorzolamide (TRUSOPT) 2 % ophthalmic solution Place 1 drop into the right eye 2 (two) times daily.   neomycin-polymyxin b-dexamethasone  (MAXITROL) 3.5-10000-0.1 OINT Place 1 Application into both eyes 3 (three) times daily.   No current facility-administered medications for this visit. (Ophthalmic Drugs)   Current Outpatient Medications (Other)  Medication Sig   ascorbic acid (VITAMIN C) 500 MG tablet Take 500 mg by mouth daily.   aspirin 81 MG chewable tablet Chew 81 mg by mouth daily.   atorvastatin  (LIPITOR) 80 MG tablet Take 1 tablet by mouth once daily   Biotin 800 MCG TABS daily at 6 (six) AM.   calcium  carbonate (SUPER CALCIUM ) 1500 (600 Ca) MG TABS  tablet Take 1 tablet by mouth daily.   Cholecalciferol 25 MCG (1000 UT) capsule Take 1,000 Units by mouth daily.   dicyclomine (BENTYL) 10 MG capsule Take 10 mg by mouth as needed for spasms (IBS).   diphenoxylate-atropine (LOMOTIL) 2.5-0.025 MG tablet Take 1 tablet by mouth as needed for diarrhea or loose stools.   donepezil (ARICEPT) 10 MG tablet Take 10 mg by mouth at bedtime.   ergocalciferol  (VITAMIN D2) 50000 units capsule Take 50,000 Units by mouth once a week.   ezetimibe  (ZETIA ) 10 MG tablet Take 1 tablet by mouth once daily   furosemide  (LASIX ) 40 MG tablet Take 1 tablet (40 mg total) by mouth daily.   HYDROcodone-acetaminophen (NORCO) 7.5-325 MG tablet Take 1 tablet by mouth 3 (three) times daily.   loratadine (CLARITIN) 10 MG tablet Take 10 mg by mouth daily as needed for allergies.   memantine (NAMENDA) 5 MG tablet Take 5 mg by mouth 2 (two) times daily.   metoprolol  succinate (TOPROL  XL) 25 MG 24 hr tablet Take 1 tablet (25 mg total) by mouth daily.   montelukast (SINGULAIR) 10 MG tablet Take 10 mg by mouth at bedtime.   Multiple Vitamins-Minerals (PRESERVISION AREDS 2+MULTI VIT PO) Take 1 tablet by mouth daily.   nitrofurantoin, macrocrystal-monohydrate, (MACROBID) 100 MG capsule Take 100 mg by mouth 2 (two) times daily.   ondansetron  (ZOFRAN ) 4  MG tablet Take 4 mg by mouth every 8 (eight) hours as needed for nausea or vomiting.   potassium chloride  SA (KLOR-CON  M) 20 MEQ tablet Please take this medication on the days that you take an extra dose of Lasix .   senna (SENOKOT) 8.6 MG tablet Take 1 tablet by mouth daily.   solifenacin (VESICARE) 10 MG tablet Take 10 mg by mouth daily.   tamsulosin (FLOMAX) 0.4 MG CAPS capsule Take 0.4 mg by mouth daily after supper.   tiZANidine (ZANAFLEX) 4 MG tablet Take 4 mg by mouth every 8 (eight) hours as needed for muscle spasms.   Triamcinolone  Acetonide (KENALOG -80 IJ) Inject 1 Dose as directed once.   vitamin B-12 (CYANOCOBALAMIN) 1000 MCG  tablet Take 1,000 mcg by mouth daily.   Zinc Sulfate 66 MG TABS Take 66 mg by mouth daily.   No current facility-administered medications for this visit. (Other)   REVIEW OF SYSTEMS: ROS   Positive for: Neurological, Skin, Musculoskeletal, Cardiovascular, Eyes, Respiratory Negative for: Constitutional, Gastrointestinal, Genitourinary, HENT, Endocrine, Psychiatric, Allergic/Imm, Heme/Lymph Last edited by Elnor Avelina RAMAN, COT on 12/28/2023  9:44 AM.      ALLERGIES Allergies  Allergen Reactions   Duloxetine Hives, Itching, Nausea Only, Swelling, Dermatitis and Rash   Clarithromycin Diarrhea and Other (See Comments)   Prednisone     Passed out    Sertraline Diarrhea   Duloxetine Hcl Itching, Nausea Only, Other (See Comments), Rash and Swelling   Sertraline Hcl Diarrhea   PAST MEDICAL HISTORY Past Medical History:  Diagnosis Date   Acute deep vein thrombosis (DVT) of left peroneal vein 04/11/2019   Aortic stenosis 04/12/2021   Basal cell carcinoma (BCC) in situ of skin 02/14/2021   Had several places of basal cell carcinoma removed on top of head over the last 2 to 3 years.     Bunion of great toe of right foot 02/15/1997   Bunion removed from right big toe by Dr Rollie, [place]- [facility] in 1999.     Cervical spondylosis 03/18/2018   CHF (congestive heart failure) 08/28/2023   Chronic bilateral low back pain without sciatica 03/18/2018   Chronic foot pain, left 03/14/2019   Possible fracture base 2nd metatarsal   Chronic neck pain 03/18/2018   COPD (chronic obstructive pulmonary disease)    DDD (degenerative disc disease), cervical 03/18/2018   Deviated nasal septum 02/15/1997   Surgery at [facility] for deviated nasal Septum.     DVT femoral (deep venous thrombosis) with thrombophlebitis, left 04/11/2019   Dyspnea on exertion 01/27/2018   Facet arthritis, degenerative, lumbar spine 03/18/2018   Family history of malignant neoplasm of breast    Generalized  anxiety disorder 01/24/2019   Possible history of panic attacks   Genetic testing 11/14/2020   Negative genetic testing on the BreastNext panel.  PALB2 p.L100F VUS.  The BreastNext gene panel offered by [employer] includes sequencing and rearrangement analysis for the following 17 genes:  ATM, BARD1, BRCA1, BRCA2, BRIP1, CDH1, CHEK2, MRE11A, MUTYH, NBN, NF1, PALB2, PTEN, RAD50, RAD51C, RAD51D, and TP53.   The report date was 11/18/2013.     UPDATE: PALB2 p.L100F has been reclassified to Likely B   Greater trochanteric bursitis of both hips 04/10/2018   Heart murmur 01/27/2018   History of breast cancer 03/18/2018   Formatting of this note might be different from the original. S/p bilateral mastectomy     History of COVID-19 10/13/2022   I had covid from 10-13-2022 until 10-30-2022.  History of deep vein thrombosis (DVT) of lower extremity 10/02/2019   Hypertrophic and atrophic condition of skin 07/03/2021   Left carotid artery stenosis 07/03/2022   Left leg swelling 02/23/2019   Lumbar spondylosis 03/18/2018   Malignant neoplasm of central portion of left female breast    S/p bilateral mastectomy   Mild mitral regurgitation 01/27/2018   Mild neurocognitive disorder due to another medical condition 03/20/2022   Mitral valve prolapse 01/27/2018   Obstructive sleep apnea    Variable CPAP use.   Osteopenia after menopause 12/21/2020   Other idiopathic scoliosis, lumbar region 03/18/2018   Pain of left calf 03/14/2019   Palpitations 01/25/2018   Peripheral vascular disease, unspecified 10/29/2020   up to 69% stenosis on the lef internal carotic artery based on cardiac ultrasounds from June 2022   Personal history of colon polyps, unspecified 03/16/2023   Pes planus of left foot 02/23/2019   Pinched nerve 02/10/2021   Ptosis of both upper eyelids 08/27/2022   Had medical necessary surgery for droopy eyelids as a result of loss of peripheral vision in both eyes.     Rib pain on right side  01/21/2023   Right lumbar radiculopathy 03/19/2021   Stasis dermatitis, acute 10/03/2019   Tenosynovitis of left ankle 04/11/2019   TIA (transient ischemic attack) 01/17/2019   Tongue adhesions, congenital 12/04/2021   Dr. HONOR had suspicions that I may tongue cancer. I had biopsy of tongue. Was only a cyst. No cancer.     Triple negative breast cancer (HCC) 06/09/2023   03-31-2010  I had Triple Negative Breast Cancer. I had bilateral mastectomy performed by Dr Bert in [place], [place].     Urge incontinence 07/28/2019   Varicose veins of left lower extremity    Ventricular tachycardia 02/28/2019   Past Surgical History:  Procedure Laterality Date   basal cell removed     Head   BUNIONECTOMY Right 1999   eye lid lift Bilateral    done July 2024   MASTECTOMY Bilateral 2012   NASAL SEPTUM SURGERY  1996   SKIN CANCER EXCISION  2018   Top of head    SKIN CANCER EXCISION  07/2019   Nose   TENDON TRANSFER Right 1999   foot   TONGUE BIOPSY  12/04/2021   Dr Honor   FAMILY HISTORY Family History  Problem Relation Age of Onset   Lymphoma Mother 42       deceased 49   Breast cancer Maternal Aunt        Dx 5s; Deceased 16s   Lung cancer Maternal Uncle    Stomach cancer Paternal Uncle        deceased 36   Liver cancer Maternal Uncle    Prostate cancer Other        3 mat cousins   Breast cancer Other 81       mother's mat half-sister   Emphysema Father    SOCIAL HISTORY Social History   Tobacco Use   Smoking status: Never   Smokeless tobacco: Never  Vaping Use   Vaping status: Never Used  Substance Use Topics   Alcohol use: Not Currently   Drug use: Never       OPHTHALMIC EXAM: Base Eye Exam     Visual Acuity (Snellen - Linear)       Right Left   Dist [place] 20/30 +2 20/25   Dist ph [place] 20/25 -1 NI         Tonometry (Tonopen, 9:49  AM)       Right Left   Pressure 17 15         Pupils       Pupils Dark Light Shape React APD   Right PERRL 1 1 Round Minimal  None   Left PERRL 1 1 Round Minimal None         Visual Fields       Left Right    Full Full         Extraocular Movement       Right Left    Full, Ortho Full, Ortho         Neuro/Psych     Oriented x3: Yes   Mood/Affect: Normal         Dilation     Both eyes: 1.0% Mydriacyl, 2.5% Phenylephrine @ 9:49 AM           Slit Lamp and Fundus Exam     External Exam       Right Left   External Normal Normal         Slit Lamp Exam       Right Left   Lids/Lashes Dermatochalasis - upper lid Dermatochalasis - upper lid, Meibomian gland dysfunction   Conjunctiva/Sclera White and quiet White and quiet   Cornea mild arcus, tear film debris mild arcus, trace tear film debris   Anterior Chamber Deep and quiet Deep and quiet   Iris Round and dilated Round and dilated   Lens 2-3+ Nuclear sclerosis, 2-3+ Cortical cataract 2-3+ Nuclear sclerosis, 2-3+ Cortical cataract   Anterior Vitreous Mild syneresis, Posterior vitreous detachment, mild vitreous condensations Mild syneresis, Posterior vitreous detachment, mild vitreous condensations         Fundus Exam       Right Left   Disc Pink and Sharp, +cupping, mild PPA Pink and Sharp   C/D Ratio 0.7 0.6   Macula Flat, Blunted foveal reflex, RPE mottling, clumping and early atrophy, rare, fine drusen, No heme or edema Flat, Blunted foveal reflex, RPE mottling, rare, fine drusen, No heme or edema   Vessels attenuated, mild tortuosity attenuated, mild tortuosity   Periphery Attached, mild reticular degeneration, No RT/RD, No heme Attached, focal pigmented CR scars with VR tuft at 0100 -- good laser changes, reticular degeneration, no new RT/RD, No heme           IMAGING AND PROCEDURES  Imaging and Procedures for 12/28/2023  OCT, Retina - OU - Both Eyes       Right Eye Quality was good. Central Foveal Thickness: 271. Progression has been stable. Findings include normal foveal contour, no IRF, no SRF, retinal drusen  (Mild drusen).   Left Eye Quality was good. Central Foveal Thickness: 267. Progression has been stable. Findings include normal foveal contour, no IRF, no SRF, retinal drusen (Mild drusen).   Notes *Images captured and stored on drive  Diagnosis / Impression:  Nonexudative ARMD OU -- mild drusen OU-- no significant progression from prior NFP, no IRF/SRF OU  Clinical management:  See below  Abbreviations: NFP - Normal foveal profile. CME - cystoid macular edema. PED - pigment epithelial detachment. IRF - intraretinal fluid. SRF - subretinal fluid. EZ - ellipsoid zone. ERM - epiretinal membrane. ORA - outer retinal atrophy. ORT - outer retinal tubulation. SRHM - subretinal hyper-reflective material. IRHM - intraretinal hyper-reflective material            ASSESSMENT/PLAN:    ICD-10-CM   1. Early dry stage nonexudative age-related macular  degeneration of both eyes  H35.3131 OCT, Retina - OU - Both Eyes    2. Vitreoretinal tuft of left eye  Q14.1     3. Left retinal defect  H33.302     4. Posterior vitreous detachment of both eyes  H43.813     5. Essential hypertension  I10     6. Hypertensive retinopathy of both eyes  H35.033     7. Combined forms of age-related cataract of both eyes  H25.813      1. Age related macular degeneration, non-exudative, both eyes  - early stage - mild drusen OU - stable - The incidence, anatomy, and pathology of dry AMD, risk of progression, and the AREDS and AREDS 2 study including smoking risks discussed with patient.  - Recommend amsler grid monitoring  - f/u 9-12 months  2-3 Pigmented CR scar with VR tuft OS  - pt with h/o PVD OS and persistent flashes/floaters  - exam shows pigmented CR scar with VR tuft at 0100  - s/p laser retinopexy OS 11.08.22 -- good laser changes in place  - no new RT/RD - f/u 9-12 months, DFE, OCT  4. PVD / vitreous syneresis OU  - symptomatic flashes/floaters -- improved  - Discussed findings and  prognosis  - No RT or RD on 360 scleral depressed exam  - Reviewed s/s of RT/RD  - Strict return precautions for any such RT/RD signs/symptoms  5,6. Hypertensive retinopathy OU - discussed importance of tight BP control - monitor  7. Mixed Cataract OU - The symptoms of cataract, surgical options, and treatments and risks were discussed with patient. - discussed diagnosis and progression - under the expert management of Dr. Fleeta  Ophthalmic Meds Ordered this visit:  No orders of the defined types were placed in this encounter.     Return in about 1 year (around 12/27/2024) for non ex ARMD OU - DFE, OCT.  There are no Patient Instructions on file for this visit.  This document serves as a record of services personally performed by Redell JUDITHANN Hans, MD, PhD. It was created on their behalf by Avelina Pereyra, COA an ophthalmic technician. The creation of this record is the provider's dictation and/or activities during the visit.   Electronically signed by: Avelina GORMAN Pereyra, COT  01/03/24  3:17 PM   This document serves as a record of services personally performed by Redell JUDITHANN Hans, MD, PhD. It was created on their behalf by Wanda GEANNIE Keens, COT an ophthalmic technician. The creation of this record is the provider's dictation and/or activities during the visit.    Electronically signed by:  Wanda GEANNIE Keens, COT  01/03/24 3:17 PM  Redell JUDITHANN Hans, M.D., Ph.D. Diseases & Surgery of the Retina and Vitreous [facility]  I have reviewed the above documentation for accuracy and completeness, and I agree with the above. Redell JUDITHANN Hans, M.D., Ph.D. 01/03/24 3:17 PM   Abbreviations: M myopia (nearsighted); A astigmatism; H hyperopia (farsighted); P presbyopia; Mrx spectacle prescription;  CTL contact lenses; OD right eye; OS left eye; OU both eyes  XT exotropia; ET esotropia; PEK punctate epithelial keratitis; PEE punctate epithelial erosions; DES dry eye syndrome;  MGD meibomian gland dysfunction; ATs artificial tears; PFAT's preservative free artificial tears; NSC nuclear sclerotic cataract; PSC posterior subcapsular cataract; ERM epi-retinal membrane; PVD posterior vitreous detachment; RD retinal detachment; DM diabetes mellitus; DR diabetic retinopathy; NPDR non-proliferative diabetic retinopathy; PDR proliferative diabetic retinopathy; CSME clinically significant macular edema; DME  diabetic macular edema; dbh dot blot hemorrhages; CWS cotton wool spot; POAG primary open angle glaucoma; C/D cup-to-disc ratio; HVF humphrey visual field; GVF goldmann visual field; OCT optical coherence tomography; IOP intraocular pressure; BRVO Branch retinal vein occlusion; CRVO central retinal vein occlusion; CRAO central retinal artery occlusion; BRAO branch retinal artery occlusion; RT retinal tear; SB scleral buckle; PPV pars plana vitrectomy; VH Vitreous hemorrhage; PRP panretinal laser photocoagulation; IVK intravitreal kenalog ; VMT vitreomacular traction; MH Macular hole;  NVD neovascularization of the disc; NVE neovascularization elsewhere; AREDS age related eye disease study; ARMD age related macular degeneration; POAG primary open angle glaucoma; EBMD epithelial/anterior basement membrane dystrophy; ACIOL anterior chamber intraocular lens; IOL intraocular lens; PCIOL posterior chamber intraocular lens; Phaco/IOL phacoemulsification with intraocular lens placement; PRK photorefractive keratectomy; LASIK laser assisted in situ keratomileusis; HTN hypertension; DM diabetes mellitus; COPD chronic obstructive pulmonary disease

## 2023-12-16 ENCOUNTER — Ambulatory Visit: Admitting: Cardiology

## 2023-12-23 DIAGNOSIS — M79671 Pain in right foot: Secondary | ICD-10-CM | POA: Diagnosis not present

## 2023-12-23 DIAGNOSIS — Z23 Encounter for immunization: Secondary | ICD-10-CM | POA: Diagnosis not present

## 2023-12-24 ENCOUNTER — Encounter: Admitting: Psychology

## 2023-12-24 DIAGNOSIS — M79671 Pain in right foot: Secondary | ICD-10-CM | POA: Diagnosis not present

## 2023-12-24 DIAGNOSIS — S93601A Unspecified sprain of right foot, initial encounter: Secondary | ICD-10-CM | POA: Diagnosis not present

## 2023-12-28 ENCOUNTER — Encounter (INDEPENDENT_AMBULATORY_CARE_PROVIDER_SITE_OTHER): Payer: Self-pay | Admitting: Ophthalmology

## 2023-12-28 ENCOUNTER — Ambulatory Visit (INDEPENDENT_AMBULATORY_CARE_PROVIDER_SITE_OTHER): Payer: Medicare Other | Admitting: Ophthalmology

## 2023-12-28 DIAGNOSIS — I10 Essential (primary) hypertension: Secondary | ICD-10-CM | POA: Diagnosis not present

## 2023-12-28 DIAGNOSIS — H353131 Nonexudative age-related macular degeneration, bilateral, early dry stage: Secondary | ICD-10-CM

## 2023-12-28 DIAGNOSIS — H35033 Hypertensive retinopathy, bilateral: Secondary | ICD-10-CM

## 2023-12-28 DIAGNOSIS — H25813 Combined forms of age-related cataract, bilateral: Secondary | ICD-10-CM | POA: Diagnosis not present

## 2023-12-28 DIAGNOSIS — Q141 Congenital malformation of retina: Secondary | ICD-10-CM | POA: Diagnosis not present

## 2023-12-28 DIAGNOSIS — H33302 Unspecified retinal break, left eye: Secondary | ICD-10-CM

## 2023-12-28 DIAGNOSIS — H43813 Vitreous degeneration, bilateral: Secondary | ICD-10-CM | POA: Diagnosis not present

## 2023-12-29 DIAGNOSIS — K5730 Diverticulosis of large intestine without perforation or abscess without bleeding: Secondary | ICD-10-CM | POA: Diagnosis not present

## 2023-12-29 DIAGNOSIS — N3289 Other specified disorders of bladder: Secondary | ICD-10-CM | POA: Diagnosis not present

## 2023-12-29 DIAGNOSIS — N3090 Cystitis, unspecified without hematuria: Secondary | ICD-10-CM | POA: Diagnosis not present

## 2023-12-30 ENCOUNTER — Ambulatory Visit: Admitting: Neurology

## 2024-01-03 ENCOUNTER — Encounter (INDEPENDENT_AMBULATORY_CARE_PROVIDER_SITE_OTHER): Payer: Self-pay | Admitting: Ophthalmology

## 2024-01-05 DIAGNOSIS — M79671 Pain in right foot: Secondary | ICD-10-CM | POA: Diagnosis not present

## 2024-01-05 DIAGNOSIS — S93601A Unspecified sprain of right foot, initial encounter: Secondary | ICD-10-CM | POA: Diagnosis not present

## 2024-01-06 DIAGNOSIS — M19072 Primary osteoarthritis, left ankle and foot: Secondary | ICD-10-CM | POA: Diagnosis not present

## 2024-01-06 DIAGNOSIS — Z86718 Personal history of other venous thrombosis and embolism: Secondary | ICD-10-CM | POA: Diagnosis not present

## 2024-01-06 DIAGNOSIS — E785 Hyperlipidemia, unspecified: Secondary | ICD-10-CM | POA: Diagnosis not present

## 2024-01-06 DIAGNOSIS — E0590 Thyrotoxicosis, unspecified without thyrotoxic crisis or storm: Secondary | ICD-10-CM | POA: Diagnosis not present

## 2024-01-06 DIAGNOSIS — E559 Vitamin D deficiency, unspecified: Secondary | ICD-10-CM | POA: Diagnosis not present

## 2024-01-06 DIAGNOSIS — R413 Other amnesia: Secondary | ICD-10-CM | POA: Diagnosis not present

## 2024-01-06 DIAGNOSIS — Z9181 History of falling: Secondary | ICD-10-CM | POA: Diagnosis not present

## 2024-01-06 DIAGNOSIS — R600 Localized edema: Secondary | ICD-10-CM | POA: Diagnosis not present

## 2024-01-06 DIAGNOSIS — J4540 Moderate persistent asthma, uncomplicated: Secondary | ICD-10-CM | POA: Diagnosis not present

## 2024-01-06 DIAGNOSIS — I5032 Chronic diastolic (congestive) heart failure: Secondary | ICD-10-CM | POA: Diagnosis not present

## 2024-01-06 DIAGNOSIS — M19071 Primary osteoarthritis, right ankle and foot: Secondary | ICD-10-CM | POA: Diagnosis not present

## 2024-01-06 DIAGNOSIS — M160 Bilateral primary osteoarthritis of hip: Secondary | ICD-10-CM | POA: Diagnosis not present

## 2024-01-06 DIAGNOSIS — N3281 Overactive bladder: Secondary | ICD-10-CM | POA: Diagnosis not present

## 2024-01-11 DIAGNOSIS — N3020 Other chronic cystitis without hematuria: Secondary | ICD-10-CM | POA: Diagnosis not present

## 2024-01-11 DIAGNOSIS — N952 Postmenopausal atrophic vaginitis: Secondary | ICD-10-CM | POA: Diagnosis not present

## 2024-01-22 DIAGNOSIS — G894 Chronic pain syndrome: Secondary | ICD-10-CM | POA: Diagnosis not present

## 2024-01-22 DIAGNOSIS — M48062 Spinal stenosis, lumbar region with neurogenic claudication: Secondary | ICD-10-CM | POA: Diagnosis not present

## 2024-01-22 DIAGNOSIS — M4727 Other spondylosis with radiculopathy, lumbosacral region: Secondary | ICD-10-CM | POA: Diagnosis not present

## 2024-01-27 ENCOUNTER — Telehealth: Payer: Self-pay | Admitting: Oncology

## 2024-01-27 ENCOUNTER — Inpatient Hospital Stay: Attending: Oncology | Admitting: Oncology

## 2024-01-27 VITALS — BP 124/71 | HR 74 | Temp 97.6°F | Resp 16 | Ht 64.0 in | Wt 191.9 lb

## 2024-01-27 DIAGNOSIS — Z8672 Personal history of thrombophlebitis: Secondary | ICD-10-CM | POA: Diagnosis not present

## 2024-01-27 DIAGNOSIS — Z853 Personal history of malignant neoplasm of breast: Secondary | ICD-10-CM | POA: Insufficient documentation

## 2024-01-27 DIAGNOSIS — M8580 Other specified disorders of bone density and structure, unspecified site: Secondary | ICD-10-CM | POA: Diagnosis not present

## 2024-01-27 DIAGNOSIS — C50112 Malignant neoplasm of central portion of left female breast: Secondary | ICD-10-CM

## 2024-01-27 DIAGNOSIS — Z8673 Personal history of transient ischemic attack (TIA), and cerebral infarction without residual deficits: Secondary | ICD-10-CM | POA: Diagnosis not present

## 2024-01-27 DIAGNOSIS — Z780 Asymptomatic menopausal state: Secondary | ICD-10-CM

## 2024-01-27 DIAGNOSIS — Z171 Estrogen receptor negative status [ER-]: Secondary | ICD-10-CM

## 2024-01-27 DIAGNOSIS — M5030 Other cervical disc degeneration, unspecified cervical region: Secondary | ICD-10-CM | POA: Diagnosis not present

## 2024-01-27 DIAGNOSIS — Z86718 Personal history of other venous thrombosis and embolism: Secondary | ICD-10-CM | POA: Insufficient documentation

## 2024-01-27 NOTE — Progress Notes (Signed)
 " [facility]  [address] [place],  [place]  [address] [phone]  Clinic Day: 01/27/24  Referring physician: Keren Vicenta BRAVO, MD  ASSESSMENT & PLAN:  Assessment: Malignant neoplasm of central portion of left female breast [facility]) History of stage I triple negative breast cancer, diagnosed January 2012, status post bilateral mastectomy. She remains without evidence of recurrence. Genetic testing in 2015 was negative.   TIA (transient ischemic attack) History of transient ischemic attacks, and in June 2022 had a CVA.  This impaired her memory, but she has been placed on medication with improvement.    DVT femoral (deep venous thrombosis) with thrombophlebitis, left (HCC) Deep venous thrombosis of the left lower extremity in May 2021. Recurrent provoked deep venous thrombosis of the right lower extremity in February 2022, for which she continues aspirin  81 mg daily daily   Basal cell carcinoma of scalp The patient felt a nodule and [facility] had resected a skin cancer years ago and so checks her regularly.  May 2023 a biopsy was positive for basal cell carcinoma and so a wide excision was done but they were unable to close completely. Final pathology still showed residual basal cell carcinoma but with clear margins. I am  not concerned about the area of her central scar but she will f/u with [facility].   Tongue lesion Pathology was benign.    Degenerative Disc Disease  She has 2 pinched nerves of the lower spine. She has not tolerated any of the medications used for neuropathic pain. Bone scan in December did not show any malignant disease, but some areas of degenerative change at bilateral ankles and lumbar spine.   Plan: She has had 2 injections that gave her temporary relief for her pinched nerves in her back but is now taking hydrocodone 7.5-325 mg TID prn for pain management. Her PCP Dr. Anthonette continues routine labs and she gets a bone density scan every 2 years.  She inquired if she should get the pneumonia shot and I encouraged her to get the Prevnar vaccine. I will see her back in 6 months for reevaluation. She understands and agrees with this plan of care.  She knows to contact our office if she develops concerns prior to her next appointment.  I provided 12 minutes of face-to-face time during this this encounter and > 50% was spent counseling as documented under my assessment and plan.   Karen [facility] VEAR Cornish, MD  [facility] - A DEPT OF [facility] [address] [place] [address] Dept: [phone] Dept Fax: [phone]   No orders of the defined types were placed in this encounter.   CHIEF COMPLAINT:  CC: Remote history of stage IA triple negative breast cancer  Current Treatment:  Surveillance  HISTORY OF PRESENT ILLNESS:  [facility] is a 73 y.o. female with a history of stage IA (T1b N0 M0) triple negative left breast cancer diagnosed in January 2012.  She underwent bilateral mastectomies.  Pathology revealed a 9 mm, grade 1, invasive ductal carcinoma with negative nodes.  Estrogen and progesterone receptors were negative and her 2 Neu negative.  Ki 67 was 9%.  She was placed on observation alone.  She was referred to genetic counselor at [facility] due to her triple negative disease.  She underwent testing with the [facility] Breast Next Panel.  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  She has multiple comorbidities, including severe COPD.  Screening colonoscopy in November 2018 with Dr. Towana, at which time had removal of a small polyp.  Follow-up colonoscopy in 5 years was recommended.   She described transient ischemic attacks in September and October 2020 .  She is followed by by Dr. Juliene [facility], a neurologist, at [facility].  She stated she was scheduled for an MRI/MRA head at [facility], as well as a  cognitive test in January 2021.  We were concerned about temporal arteritis, but a sed rate was normal.    She states she had decreased memory on her cognitive test and he plans to repeat that yearly.  She is on memantine and donepezil and denies further episodes of TIA.  She was found to have a deep venous thrombus of the left leg in at [facility] in February 2021, for which she was placed on apixaban 5 mg twice daily and was given samples. She could not afford to continue apixaban, so was placed on aspirin 81 mg daily once the thrombus resolved. She has a history of mitral valve prolapse and murmur, for which she sees Dr. Bernie.  She states she wore a heart monitor for in January 2021, which revealed complex arrhythmia. She had a stress test stress test in his office, which was normal.  She also had ultrasounds of the carotid arteries, which were normal.  She is on metoprolol  and furosemide .  Bone density scan in July 2021 revealed osteopenia.  She has these done at Dr. Margarete office. She had eyelid surgery in October 2021. She undergoes routine lab work at Dr. Margarete office.  CTA heart in May revealed non obstructive coronary artery disease. She had a basal cell carcinoma resected from her scalp in May of 2023 and sees [facility] every 6 months.   Oncology History  Malignant neoplasm of central portion of left female breast   Initial Diagnosis   Malignant neoplasm of central portion of left female breast (HCC)   02/21/2010 Cancer Staging   Staging form: Breast, AJCC 7th Edition - Clinical stage from 02/21/2010: Stage IA (T1b, N0, M0) - Signed by Cornelius [facility] DEL, MD on 12/21/2020 Staged by: Managing physician Diagnostic confirmation: Positive histology Specimen type: Excision Histopathologic type: Infiltrating duct carcinoma, NOS Stage prefix: Initial diagnosis Laterality: Left Tumor size (mm): 9 Method of lymph node assessment: Sentinel lymph node biopsy Histologic grade (G):  G1 Lymph-vascular invasion (LVI): LVI not present (absent)/not identified Residual tumor (R): R0 - None Paget's disease: Negative Tumor grade (Scarff-Bloom-Richardson system): G1 Estrogen receptor status: Negative Progesterone receptor status: Negative HER2 status: Negative Stage used in treatment planning: Yes National guidelines used in treatment planning: Yes Type of national guideline used in treatment planning: NCCN   11/18/2013 Genetic Testing   Negative genetic testing on the BreastNext panel.  PALB2 p.L100F VUS.  The BreastNext gene panel offered by [facility] includes sequencing and rearrangement analysis for the following 17 genes:  ATM, BARD1, BRCA1, BRCA2, BRIP1, CDH1, CHEK2, MRE11A, MUTYH, NBN, NF1, PALB2, PTEN, RAD50, RAD51C, RAD51D, and TP53.   The report date was 11/18/2013.  UPDATE: PALB2 p.L100F has been reclassified to Likely Benign.  The amended report date is November 09, 2020.       INTERVAL HISTORY:  Jaid is here today for repeat clinical assessment for her remote history of stage IA triple negative left breast cancer in 2012.  Patient states that she feels well but complains of 2 pinched nerves  in her back that has persisted for over an year. She has had 2 injections that gave her temporary relief but is now taking hydrocodone 7.5-325 mg TID prn for pain management. Her PCP Dr. Anthonette continues routine labs and she gets a bone density scan every 2 years. She inquired if she should get the pneumonia shot and I encouraged her to get the Prevnar vaccine. I will see her back in 6 months for reevaluation. She denies fever, chills, night sweats, or other signs of infection. She denies cardiorespiratory and gastrointestinal issues. Her appetite is very good and Her weight has decreased 5 pounds over last 6 months.   REVIEW OF SYSTEMS:  Review of Systems  Constitutional:  Positive for fatigue. Negative for appetite change, chills, diaphoresis, fever and unexpected weight  change.  HENT:  Negative.  Negative for lump/mass, mouth sores, nosebleeds, sore throat and trouble swallowing.   Eyes:  Positive for eye problems (mild macular degeneration, right eye).  Respiratory: Negative.  Negative for chest tightness, cough, hemoptysis and shortness of breath.   Cardiovascular: Negative.  Negative for chest pain, leg swelling and palpitations.  Gastrointestinal: Negative.  Negative for abdominal pain, blood in stool, constipation, diarrhea, nausea and vomiting.  Endocrine: Negative.  Negative for hot flashes.  Genitourinary:  Positive for hematuria (chronic). Negative for difficulty urinating, dysuria, frequency, vaginal bleeding and vaginal discharge.        Recurrent UTI's  Musculoskeletal:  Positive for back pain. Negative for arthralgias, gait problem and myalgias.  Skin: Negative.  Negative for itching, rash and wound.  Neurological:  Negative for dizziness, extremity weakness, gait problem, headaches, light-headedness and numbness.  Hematological: Negative.  Negative for adenopathy. Does not bruise/bleed easily.  Psychiatric/Behavioral: Negative.  Negative for depression and sleep disturbance. The patient is not nervous/anxious.     VITALS:  Blood pressure 124/71, pulse 74, temperature 97.6 F (36.4 C), temperature source Oral, resp. rate 16, height 5' 4 (1.626 m), weight 191 lb 14.4 oz (87 kg), SpO2 99%.  Wt Readings from Last 3 Encounters:  01/27/24 191 lb 14.4 oz (87 kg)  10/29/23 195 lb (88.5 kg)  09/15/23 188 lb (85.3 kg)    Body mass index is 32.94 kg/m.  Performance status (ECOG): 1 - Symptomatic but completely ambulatory  PHYSICAL EXAM:  Physical Exam Vitals and nursing note reviewed.  Constitutional:      General: She is not in acute distress.    Appearance: Normal appearance. She is normal weight. She is not ill-appearing, toxic-appearing or diaphoretic.  HENT:     Head: Normocephalic and atraumatic.     Right Ear: Tympanic membrane, ear  canal and external ear normal. There is no impacted cerumen.     Left Ear: Tympanic membrane, ear canal and external ear normal. There is no impacted cerumen.     Nose: Nose normal. No congestion or rhinorrhea.     Mouth/Throat:     Mouth: Mucous membranes are moist.     Pharynx: Oropharynx is clear. No oropharyngeal exudate or posterior oropharyngeal erythema.  Eyes:     General: No scleral icterus.       Right eye: No discharge.        Left eye: No discharge.     Extraocular Movements: Extraocular movements intact.     Conjunctiva/sclera: Conjunctivae normal.     Pupils: Pupils are equal, round, and reactive to light.  Neck:     Vascular: No carotid bruit.  Cardiovascular:     Rate and Rhythm:  Normal rate and regular rhythm.     Heart sounds: Murmur heard.     Systolic murmur is present with a grade of 2/6.     No friction rub. No gallop.  Pulmonary:     Effort: Pulmonary effort is normal.     Breath sounds: Normal breath sounds. No wheezing, rhonchi or rales.  Chest:  Breasts:    Right: Absent.     Left: Absent.     Comments: Bilateral mastectomies are negative Abdominal:     General: Bowel sounds are normal. There is no distension.     Palpations: Abdomen is soft. There is no hepatomegaly, splenomegaly or mass.     Tenderness: There is no abdominal tenderness. There is no right CVA tenderness, left CVA tenderness, guarding or rebound.     Hernia: No hernia is present.  Musculoskeletal:        General: Normal range of motion.     Cervical back: Normal range of motion and neck supple. No rigidity or tenderness.     Right lower leg: No edema.     Left lower leg: No edema.  Lymphadenopathy:     Cervical: No cervical adenopathy.     Upper Body:     Right upper body: No supraclavicular or axillary adenopathy.     Left upper body: No supraclavicular or axillary adenopathy.     Lower Body: No right inguinal adenopathy. No left inguinal adenopathy.  Skin:    General: Skin is  warm and dry.     Coloration: Skin is not jaundiced or pale.     Findings: Lesion present. No erythema or rash.     Comments: She has a slightly dark area at the vertex of her scalp, in the Shannon of her scar from prior resection  Neurological:     General: No focal deficit present.     Mental Status: She is alert and oriented to person, place, and time. Mental status is at baseline.     Cranial Nerves: No cranial nerve deficit.     Sensory: No sensory deficit.     Motor: No weakness.     Gait: Gait normal.     Deep Tendon Reflexes: Reflexes normal.  Psychiatric:        Mood and Affect: Mood normal.        Behavior: Behavior normal.        Thought Content: Thought content normal.        Judgment: Judgment normal.    LABS:      Latest Ref Rng & Units 09/08/2023    3:27 PM  CBC  WBC 3.4 - 10.8 x10E3/uL 5.1   Hemoglobin 11.1 - 15.9 g/dL 87.2   Hematocrit 65.9 - 46.6 % 41.0   Platelets 150 - 450 x10E3/uL 209       Latest Ref Rng & Units 09/15/2023    3:06 PM 09/08/2023    3:27 PM 07/30/2022    9:41 AM  CMP  Glucose 70 - 99 mg/dL 882  899    BUN 8 - 27 mg/dL 15  18    Creatinine 9.42 - 1.00 mg/dL 9.06  9.01    Sodium 865 - 144 mmol/L 144  145    Potassium 3.5 - 5.2 mmol/L 3.6  4.1    Chloride 96 - 106 mmol/L 104  104    CO2 20 - 29 mmol/L 24  24    Calcium  8.7 - 10.3 mg/dL 9.3  9.4    Total  Protein 6.0 - 8.5 g/dL   5.8   Total Bilirubin 0.0 - 1.2 mg/dL   0.6   Alkaline Phos 44 - 121 IU/L   91   AST 0 - 40 IU/L   19   ALT 0 - 32 IU/L   23     STUDIES:    Exam(s): [id number] RAD/DG RIBS W/ CHEST 3+V-R  EXAM:  EXAM DATE: 01/19/2023.  PROCEDURE: DG RIBS W/ CHEST 3+V-R.  CLINICAL HISTORY:  73 y/o old F with PLEURODYNIA.  COMPARISON:  None.  TECHNIQUE:  4 views of the chest were obtained with Right rib detail  FINDINGS:  No linear lucency, cortical irregularity or bony deformity to suggest acute displaced rib fracture.  No pulmonary parenchymal consolidative opacities.   No pneumothorax or pleural effusion. The cardiac silhouette is not enlarged. Dextroscoliosis of the lumbar region is noted.  IMPRESSION:  No radiographic evidence of acute displaced rib fracture.     HISTORY:   Past Medical History:  Diagnosis Date   Acute deep vein thrombosis (DVT) of left peroneal vein 04/11/2019   Aortic stenosis 04/12/2021   Basal cell carcinoma (BCC) in situ of skin 02/14/2021   Had several places of basal cell carcinoma removed on top of head over the last 2 to 3 years.     Bunion of great toe of right foot 02/15/1997   Bunion removed from right big toe by Dr Rollie, [place] in 1999.     Cervical spondylosis 03/18/2018   CHF (congestive heart failure) 08/28/2023   Chronic bilateral low back pain without sciatica 03/18/2018   Chronic foot pain, left 03/14/2019   Possible fracture base 2nd metatarsal   Chronic neck pain 03/18/2018   COPD (chronic obstructive pulmonary disease)    DDD (degenerative disc disease), cervical 03/18/2018   Deviated nasal septum 02/15/1997   Surgery at [facility] for deviated nasal Septum.     DVT femoral (deep venous thrombosis) with thrombophlebitis, left 04/11/2019   Dyspnea on exertion 01/27/2018   Facet arthritis, degenerative, lumbar spine 03/18/2018   Family history of malignant neoplasm of breast    Generalized anxiety disorder 01/24/2019   Possible history of panic attacks   Genetic testing 11/14/2020   Negative genetic testing on the BreastNext panel.  PALB2 p.L100F VUS.  The BreastNext gene panel offered by [facility] includes sequencing and rearrangement analysis for the following 17 genes:  ATM, BARD1, BRCA1, BRCA2, BRIP1, CDH1, CHEK2, MRE11A, MUTYH, NBN, NF1, PALB2, PTEN, RAD50, RAD51C, RAD51D, and TP53.   The report date was 11/18/2013.     UPDATE: PALB2 p.L100F has been reclassified to Likely B   Greater trochanteric bursitis of both hips 04/10/2018   Heart murmur 01/27/2018   History of breast cancer  03/18/2018   Formatting of this note might be different from the original. S/p bilateral mastectomy     History of COVID-19 10/13/2022   I had covid from 10-13-2022 until 10-30-2022.     History of deep vein thrombosis (DVT) of lower extremity 10/02/2019   Hypertrophic and atrophic condition of skin 07/03/2021   Left carotid artery stenosis 07/03/2022   Left leg swelling 02/23/2019   Lumbar spondylosis 03/18/2018   Malignant neoplasm of central portion of left female breast    S/p bilateral mastectomy   Mild mitral regurgitation 01/27/2018   Mild neurocognitive disorder due to another medical condition 03/20/2022   Mitral valve prolapse 01/27/2018   Obstructive sleep apnea    Variable CPAP use.  Osteopenia after menopause 12/21/2020   Other idiopathic scoliosis, lumbar region 03/18/2018   Pain of left calf 03/14/2019   Palpitations 01/25/2018   Peripheral vascular disease, unspecified 10/29/2020   up to 69% stenosis on the lef internal carotic artery based on cardiac ultrasounds from June 2022   Personal history of colon polyps, unspecified 03/16/2023   Pes planus of left foot 02/23/2019   Pinched nerve 02/10/2021   Ptosis of both upper eyelids 08/27/2022   Had medical necessary surgery for droopy eyelids as a result of loss of peripheral vision in both eyes.     Rib pain on right side 01/21/2023   Right lumbar radiculopathy 03/19/2021   Stasis dermatitis, acute 10/03/2019   Tenosynovitis of left ankle 04/11/2019   TIA (transient ischemic attack) 01/17/2019   Tongue adhesions, congenital 12/04/2021   Dr. HONOR had suspicions that I may tongue cancer. I had biopsy of tongue. Was only a cyst. No cancer.     Triple negative breast cancer (HCC) 06/09/2023   03-31-2010  I had Triple Negative Breast Cancer. I had bilateral mastectomy performed by Dr Bert in [place], [place].     Urge incontinence 07/28/2019   Varicose veins of left lower extremity    Ventricular tachycardia 02/28/2019     Past Surgical History:  Procedure Laterality Date   basal cell removed     Head   BUNIONECTOMY Right 1999   eye lid lift Bilateral    done July 2024   MASTECTOMY Bilateral 2012   NASAL SEPTUM SURGERY  1996   SKIN CANCER EXCISION  2018   Top of head    SKIN CANCER EXCISION  07/2019   Nose   TENDON TRANSFER Right 1999   foot   TONGUE BIOPSY  12/04/2021   Dr Honor    Family History  Problem Relation Age of Onset   Lymphoma Mother 38       deceased 34   Breast cancer Maternal Aunt        Dx 18s; Deceased 41s   Lung cancer Maternal Uncle    Stomach cancer Paternal Uncle        deceased 56   Liver cancer Maternal Uncle    Prostate cancer Other        3 mat cousins   Breast cancer Other 34       mother's mat half-sister   Emphysema Father     Social History:  reports that she has never smoked. She has never used smokeless tobacco. She reports that she does not currently use alcohol. She reports that she does not use drugs.The patient is alone today.  Allergies:  Allergies  Allergen Reactions   Duloxetine Dermatitis, Hives, Itching, Nausea Only, Rash, Swelling and Other (See Comments)    Other Reaction(s): Other (See Comments)   Clarithromycin Diarrhea and Other (See Comments)   Prednisone     Passed out    Codeine Other (See Comments)   Gadolinium Derivatives Other (See Comments)    Product containing gadolinium and/or gadolinium compound (product)   Latex Other (See Comments)   Morphine Other (See Comments)   Penicillins Other (See Comments)   Propoxyphene Other (See Comments)   Sertraline Diarrhea   Shellfish Allergy Other (See Comments)   Sulfa Antibiotics Other (See Comments)   Duloxetine Hcl Itching, Nausea Only, Other (See Comments), Rash and Swelling   Sertraline Hcl Diarrhea    Current Medications: Current Outpatient Medications  Medication Sig Dispense Refill   estradiol (ESTRACE) 0.01 %  CREA vaginal cream APPLY PEA-SIZED AMOUNT VAGINALLY ONCE PER  WEEK     furosemide  (LASIX ) 20 MG tablet TAKE 1 TABLET BY MOUTH ONCE DAILY, TAKE AN ADDITIONAL 40MG  IN THE EVENING THREE TIMES A WEEK     ascorbic acid (VITAMIN C) 500 MG tablet Take 500 mg by mouth daily.     aspirin 81 MG chewable tablet Chew 81 mg by mouth daily.     atorvastatin  (LIPITOR) 80 MG tablet Take 1 tablet by mouth once daily 90 tablet 2   Biotin 800 MCG TABS daily at 6 (six) AM.     calcium  carbonate (SUPER CALCIUM ) 1500 (600 Ca) MG TABS tablet Take 1 tablet by mouth daily.     Cholecalciferol 25 MCG (1000 UT) capsule Take 1,000 Units by mouth daily.     dicyclomine (BENTYL) 10 MG capsule Take 10 mg by mouth as needed for spasms (IBS).     diphenoxylate-atropine (LOMOTIL) 2.5-0.025 MG tablet Take 1 tablet by mouth as needed for diarrhea or loose stools.     donepezil (ARICEPT) 10 MG tablet Take 10 mg by mouth at bedtime.     dorzolamide (TRUSOPT) 2 % ophthalmic solution Place 1 drop into the right eye 2 (two) times daily.     ergocalciferol  (VITAMIN D2) 50000 units capsule Take 50,000 Units by mouth once a week.     ezetimibe  (ZETIA ) 10 MG tablet Take 1 tablet by mouth once daily 90 tablet 3   HYDROcodone-acetaminophen (NORCO) 7.5-325 MG tablet Take 1 tablet by mouth 3 (three) times daily.     loratadine (CLARITIN) 10 MG tablet Take 10 mg by mouth daily as needed for allergies.     memantine (NAMENDA) 5 MG tablet Take 5 mg by mouth 2 (two) times daily.     metoprolol  succinate (TOPROL  XL) 25 MG 24 hr tablet Take 1 tablet (25 mg total) by mouth daily. 90 tablet 3   montelukast (SINGULAIR) 10 MG tablet Take 10 mg by mouth at bedtime.     Multiple Vitamins-Minerals (PRESERVISION AREDS 2+MULTI VIT PO) Take 1 tablet by mouth daily.     neomycin-polymyxin b-dexamethasone  (MAXITROL) 3.5-10000-0.1 OINT Place 1 Application into both eyes 3 (three) times daily.     ondansetron  (ZOFRAN ) 4 MG tablet Take 4 mg by mouth every 8 (eight) hours as needed for nausea or vomiting.     potassium  chloride SA (KLOR-CON  M) 20 MEQ tablet Please take this medication on the days that you take an extra dose of Lasix . 45 tablet 3   senna (SENOKOT) 8.6 MG tablet Take 1 tablet by mouth daily.     tamsulosin (FLOMAX) 0.4 MG CAPS capsule Take 0.4 mg by mouth daily after supper.     tiZANidine (ZANAFLEX) 4 MG tablet Take 4 mg by mouth every 8 (eight) hours as needed for muscle spasms.     Triamcinolone  Acetonide (KENALOG -80 IJ) Inject 1 Dose as directed once.     vitamin B-12 (CYANOCOBALAMIN) 1000 MCG tablet Take 1,000 mcg by mouth daily.     Zinc Sulfate 66 MG TABS Take 66 mg by mouth daily.     No current facility-administered medications for this visit.    I,Jasmine M Lassiter,acting as a scribe for Karen [facility] VEAR Cornish, MD.,have documented all relevant documentation on the behalf of Karen [facility] VEAR Cornish, MD,as directed by  Karen [facility] VEAR Cornish, MD while in the presence of Karen [facility] VEAR Cornish, MD.    "

## 2024-01-27 NOTE — Telephone Encounter (Signed)
 Patient has been scheduled for follow-up visit per 01/27/2024 LOS.  Pt given an appt calendar with date and time.

## 2024-02-03 ENCOUNTER — Encounter: Payer: Self-pay | Admitting: Oncology

## 2024-02-18 ENCOUNTER — Ambulatory Visit (INDEPENDENT_AMBULATORY_CARE_PROVIDER_SITE_OTHER): Admitting: Psychology

## 2024-02-18 DIAGNOSIS — F0670 Mild neurocognitive disorder due to known physiological condition without behavioral disturbance: Secondary | ICD-10-CM | POA: Diagnosis not present

## 2024-02-18 NOTE — Progress Notes (Signed)
"  ° °  Neuropsychology Feedback Session [name] [facility]  Department of Neurology  Reason for Referral:   Karen Shannon is a 74 y.o. right-handed Caucasian female referred by Juliene Dunnings, D.O., to characterize her current cognitive functioning and assist with diagnostic clarity and treatment planning in the context of subjective cognitive decline.   Feedback:   [name] completed a comprehensive neuropsychological evaluation on 12/07/2023. Please refer to that encounter for the full report and recommendations. Briefly, results suggested an isolated impairment surrounding verbal fluency (i.e., both phonemic and semantic fluency). Performances were appropriate relative to age-matched peers across all other assessed domains. This includes processing speed, attention/concentration, executive functioning, receptive language, confrontation naming, visuospatial abilities, and all aspects of learning and memory. The cause for isolated weaknesses remains unclear and is most likely multifactorial in nature. Medically, [name] does have a history of several prior TIAs and numerous chronic cardiovascular medical ailments which could impact cognitive functioning. Recent neuroimaging suggested fairly mild microvascular ischemic disease though which is encouraging. She also described fatigue and chronic pain stemming from pinched nerves. Medication side effects, especially from hydrocodone and metoprolol  succinate, may also be playing a negative role in cognitive functioning. This combination of factors continues to represent the most likely culprit for ongoing dysfunction. Current memory patterns continue to be unconvincing surrounding symptomatic Alzheimer's disease.  [name] was unaccompanied during the current telephone call. She was within her residence while I was within my office. I discussed the limitations of evaluation and management by telemedicine and the availability of  in person appointments. [name] expressed her understanding and agreed to proceed. Content of the current session focused on the results of her neuropsychological evaluation. [name] was given the opportunity to ask questions and her questions were answered. She was encouraged to reach out should additional questions arise. A copy of her report was mailed at the conclusion of the visit.      One unit 96132 (31 minutes) was billed for Dr. Loralee time spent preparing for, conducting, and documenting the current feedback session with [name]. "

## 2024-04-04 ENCOUNTER — Ambulatory Visit: Admitting: Neurology

## 2024-04-12 ENCOUNTER — Other Ambulatory Visit

## 2024-07-27 ENCOUNTER — Inpatient Hospital Stay: Admitting: Oncology

## 2024-09-26 ENCOUNTER — Encounter (INDEPENDENT_AMBULATORY_CARE_PROVIDER_SITE_OTHER): Admitting: Ophthalmology
